# Patient Record
Sex: Female | Born: 1995 | Race: Black or African American | Hispanic: No | Marital: Single | Smoking: Current some day smoker
Health system: Southern US, Community
[De-identification: ages and names within clinical notes are randomized; demographics above are authoritative.]

## PROBLEM LIST (undated history)

## (undated) ENCOUNTER — Inpatient Hospital Stay (HOSPITAL_COMMUNITY): Payer: Self-pay

## (undated) DIAGNOSIS — Z8619 Personal history of other infectious and parasitic diseases: Secondary | ICD-10-CM

## (undated) DIAGNOSIS — J40 Bronchitis, not specified as acute or chronic: Secondary | ICD-10-CM

## (undated) DIAGNOSIS — F410 Panic disorder [episodic paroxysmal anxiety] without agoraphobia: Secondary | ICD-10-CM

## (undated) DIAGNOSIS — D649 Anemia, unspecified: Secondary | ICD-10-CM

## (undated) DIAGNOSIS — D709 Neutropenia, unspecified: Secondary | ICD-10-CM

## (undated) DIAGNOSIS — A549 Gonococcal infection, unspecified: Secondary | ICD-10-CM

## (undated) DIAGNOSIS — F419 Anxiety disorder, unspecified: Secondary | ICD-10-CM

## (undated) DIAGNOSIS — A749 Chlamydial infection, unspecified: Secondary | ICD-10-CM

## (undated) HISTORY — PX: NO PAST SURGERIES: SHX2092

## (undated) HISTORY — DX: Personal history of other infectious and parasitic diseases: Z86.19

---
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
## 2015-01-05 ENCOUNTER — Ambulatory Visit (HOSPITAL_COMMUNITY)
Admission: RE | Admit: 2015-01-05 | Discharge: 2015-01-05 | Disposition: A | Discharge status: Home / Self Care | Payer: Medicaid Other | Source: Ambulatory Visit | Attending: Family Medicine | Admitting: Family Medicine

## 2015-01-05 DIAGNOSIS — Z3689 Encounter for other specified antenatal screening: Secondary | ICD-10-CM | POA: Insufficient documentation

## 2015-01-05 DIAGNOSIS — Z3A19 19 weeks gestation of pregnancy: Secondary | ICD-10-CM | POA: Insufficient documentation

## 2015-01-05 DIAGNOSIS — Z3401 Encounter for supervision of normal first pregnancy, first trimester: Secondary | ICD-10-CM

## 2015-01-25 ENCOUNTER — Ambulatory Visit (INDEPENDENT_AMBULATORY_CARE_PROVIDER_SITE_OTHER): Payer: Medicaid Other | Admitting: Obstetrics & Gynecology

## 2015-01-25 VITALS — BP 107/66 | HR 76 | Wt 147.0 lb

## 2015-01-25 DIAGNOSIS — Z3403 Encounter for supervision of normal first pregnancy, third trimester: Secondary | ICD-10-CM

## 2015-01-25 NOTE — Progress Notes (Signed)
Subjective:  Kelli Soto is a 19 y.o. G1P0 at [redacted]w[redacted]d being seen today for ongoing prenatal care.  Patient reports no complaints.  Contractions: Not present.  Vag. Bleeding: None. Movement: Present. Denies leaking of fluid.   The following portions of the patient's history were reviewed and updated as appropriate: allergies, current medications, past family history, past medical history, past social history, past surgical history and problem list.   Objective:   Filed Vitals:   01/25/15 1048  BP: 107/66  Pulse: 76  Weight: 147 lb (66.679 kg)    Fetal Status: Fetal Heart Rate (bpm): 156   Movement: Present     General:  Alert, oriented and cooperative. Patient is in no acute distress.  Skin: Skin is warm and dry. No rash noted.   Cardiovascular: Normal heart rate noted  Respiratory: Normal respiratory effort, no problems with respiration noted  Abdomen: Soft, gravid, appropriate for gestational age. Pain/Pressure: Absent     Pelvic: Vag. Bleeding: None Vag D/C Character: Thin   Cervical exam deferred        Extremities: Normal range of motion.  Edema: None  Mental Status: Normal mood and affect. Normal behavior. Normal judgment and thought content.   Urinalysis: Urine Protein: Trace Urine Glucose: Negative  Assessment and Plan:  Pregnancy: G1P0 at [redacted]w[redacted]d  1. Supervision of normal first teen pregnancy, third trimester   Preterm labor symptoms and general obstetric precautions including but not limited to vaginal bleeding, contractions, leaking of fluid and fetal movement were reviewed in detail with the patient.  Please refer to After Visit Summary for other counseling recommendations.  Return in about 4 weeks (around 02/22/2015).   Allie Bossier, MD

## 2015-02-22 ENCOUNTER — Ambulatory Visit (INDEPENDENT_AMBULATORY_CARE_PROVIDER_SITE_OTHER): Payer: Medicaid Other | Admitting: Obstetrics & Gynecology

## 2015-02-22 ENCOUNTER — Encounter: Payer: Self-pay | Admitting: Obstetrics & Gynecology

## 2015-02-22 ENCOUNTER — Encounter: Payer: Medicaid Other | Admitting: Obstetrics & Gynecology

## 2015-02-22 VITALS — BP 105/63 | HR 60 | Wt 152.0 lb

## 2015-02-22 DIAGNOSIS — Z3403 Encounter for supervision of normal first pregnancy, third trimester: Secondary | ICD-10-CM

## 2015-02-22 NOTE — Progress Notes (Signed)
Subjective:  Kelli Soto is a 19 y.o. G1P0 at [redacted]w[redacted]d being seen today for ongoing prenatal care.  Patient reports no complaints.  Contractions: Not present.  Vag. Bleeding: None. Movement: Present. Denies leaking of fluid.   The following portions of the patient's history were reviewed and updated as appropriate: allergies, current medications, past family history, past medical history, past social history, past surgical history and problem list.   Objective:   Filed Vitals:   02/22/15 1049  BP: 105/63  Pulse: 60  Weight: 152 lb (68.947 kg)    Fetal Status: Fetal Heart Rate (bpm): 146   Movement: Present     General:  Alert, oriented and cooperative. Patient is in no acute distress.  Skin: Skin is warm and dry. No rash noted.   Cardiovascular: Normal heart rate noted  Respiratory: Normal respiratory effort, no problems with respiration noted  Abdomen: Soft, gravid, appropriate for gestational age. Pain/Pressure: Absent     Pelvic: Vag. Bleeding: None Vag D/C Character: Thin   Cervical exam deferred        Extremities: Normal range of motion.  Edema: None  Mental Status: Normal mood and affect. Normal behavior. Normal judgment and thought content.   Urinalysis: Urine Protein: Trace Urine Glucose: Negative  Assessment and Plan:  Pregnancy: G1P0 at [redacted]w[redacted]d  1. Supervision of normal first teen pregnancy, third trimester - She declines flu vaccine, is aware that ACOG recommends it - glucola, tdap, labs at next  Preterm labor symptoms and general obstetric precautions including but not limited to vaginal bleeding, contractions, leaking of fluid and fetal movement were reviewed in detail with the patient. Please refer to After Visit Summary for other counseling recommendations.  Return in about 3 weeks (around 03/15/2015) for glucola.   Allie Bossier, MD

## 2015-03-15 ENCOUNTER — Ambulatory Visit (INDEPENDENT_AMBULATORY_CARE_PROVIDER_SITE_OTHER): Payer: Medicaid Other | Admitting: Obstetrics & Gynecology

## 2015-03-15 VITALS — BP 115/72 | HR 69 | Wt 154.0 lb

## 2015-03-15 DIAGNOSIS — Z36 Encounter for antenatal screening of mother: Secondary | ICD-10-CM | POA: Diagnosis not present

## 2015-03-15 DIAGNOSIS — Z3403 Encounter for supervision of normal first pregnancy, third trimester: Secondary | ICD-10-CM

## 2015-03-15 LAB — CBC
HCT: 32.3 % — ABNORMAL LOW (ref 36.0–46.0)
Hemoglobin: 11 g/dL — ABNORMAL LOW (ref 12.0–15.0)
MCH: 29.3 pg (ref 26.0–34.0)
MCHC: 34.1 g/dL (ref 30.0–36.0)
MCV: 85.9 fL (ref 78.0–100.0)
MPV: 10.4 fL (ref 8.6–12.4)
PLATELETS: 173 10*3/uL (ref 150–400)
RBC: 3.76 MIL/uL — ABNORMAL LOW (ref 3.87–5.11)
RDW: 12.9 % (ref 11.5–15.5)
WBC: 4.3 10*3/uL (ref 4.0–10.5)

## 2015-03-15 NOTE — Progress Notes (Signed)
Subjective:  Kelli Soto is a 19 y.o. SAA G1P0 at [redacted]w[redacted]d being seen today for ongoing prenatal care.  Patient reports no complaints.  Contractions: Not present.  Vag. Bleeding: None. Movement: Present. Denies leaking of fluid.   The following portions of the patient's history were reviewed and updated as appropriate: allergies, current medications, past family history, past medical history, past social history, past surgical history and problem list.   Objective:   Filed Vitals:   03/15/15 1358  BP: 115/72  Pulse: 69  Weight: 154 lb (69.854 kg)    Fetal Status: Fetal Heart Rate (bpm): 138   Movement: Present     General:  Alert, oriented and cooperative. Patient is in no acute distress.  Skin: Skin is warm and dry. No rash noted.   Cardiovascular: Normal heart rate noted  Respiratory: Normal respiratory effort, no problems with respiration noted  Abdomen: Soft, gravid, appropriate for gestational age. Pain/Pressure: Absent     Pelvic: Vag. Bleeding: None Vag D/C Character: Thin   Cervical exam deferred        Extremities: Normal range of motion.  Edema: None  Mental Status: Normal mood and affect. Normal behavior. Normal judgment and thought content.   Urinalysis: Urine Protein: Negative Urine Glucose: Negative  Assessment and Plan:  Pregnancy: G1P0 at [redacted]w[redacted]d  1. Supervision of normal first teen pregnancy, third trimester  - CBC - HIV antibody - RPR - Glucose Tolerance, 1 HR (50g) - As of today, she declines TDAP and Flu vaccines Preterm labor symptoms and general obstetric precautions including but not limited to vaginal bleeding, contractions, leaking of fluid and fetal movement were reviewed in detail with the patient. Please refer to After Visit Summary for other counseling recommendations.  Return in about 3 weeks (around 04/05/2015).   Allie Bossier, MD

## 2015-03-16 LAB — HIV ANTIBODY (ROUTINE TESTING W REFLEX): HIV 1&2 Ab, 4th Generation: NONREACTIVE

## 2015-03-16 LAB — GLUCOSE TOLERANCE, 1 HOUR (50G) W/O FASTING: Glucose, 1 Hour GTT: 82 mg/dL (ref 70–140)

## 2015-03-16 LAB — RPR

## 2015-03-17 ENCOUNTER — Encounter: Payer: Medicaid Other | Admitting: Family Medicine

## 2015-04-05 ENCOUNTER — Telehealth: Payer: Self-pay | Admitting: *Deleted

## 2015-04-05 ENCOUNTER — Ambulatory Visit (INDEPENDENT_AMBULATORY_CARE_PROVIDER_SITE_OTHER): Payer: Medicaid Other | Admitting: Obstetrics & Gynecology

## 2015-04-05 VITALS — BP 110/67 | HR 60 | Wt 157.0 lb

## 2015-04-05 DIAGNOSIS — Z3403 Encounter for supervision of normal first pregnancy, third trimester: Secondary | ICD-10-CM

## 2015-04-05 NOTE — Telephone Encounter (Signed)
Spoke to pt, informed her that we would only be able to know estimated fetal weight by US at this gestation age but when she gets to be around 36-37 wks the physician may be able to give her an estimated size based on measurement and feeling of the baby at that time.  Pt acknowledged understanding, no other questions at this time.

## 2015-04-05 NOTE — Telephone Encounter (Signed)
-----   Message from Olevia BowensJacinda S Battle sent at 04/05/2015 11:56 AM EDT ----- Regarding: Question about Baby Question from today's visit, wants to know the baby's est. birth weight

## 2015-04-26 ENCOUNTER — Other Ambulatory Visit (HOSPITAL_COMMUNITY)
Admission: RE | Admit: 2015-04-26 | Discharge: 2015-04-26 | Disposition: A | Discharge status: Home / Self Care | Payer: Medicaid Other | Source: Ambulatory Visit | Attending: Obstetrics & Gynecology | Admitting: Obstetrics & Gynecology

## 2015-04-26 ENCOUNTER — Ambulatory Visit (INDEPENDENT_AMBULATORY_CARE_PROVIDER_SITE_OTHER): Payer: Medicaid Other | Admitting: Obstetrics & Gynecology

## 2015-04-26 ENCOUNTER — Telehealth: Payer: Self-pay | Admitting: *Deleted

## 2015-04-26 VITALS — BP 112/68 | HR 87 | Wt 159.0 lb

## 2015-04-26 DIAGNOSIS — Z113 Encounter for screening for infections with a predominantly sexual mode of transmission: Secondary | ICD-10-CM | POA: Diagnosis present

## 2015-04-26 DIAGNOSIS — Z3403 Encounter for supervision of normal first pregnancy, third trimester: Secondary | ICD-10-CM

## 2015-04-26 NOTE — Progress Notes (Signed)
Subjective:  Kelli Soto is a 19 y.o. S AA  G1P0 at 3211w4d being seen today for ongoing prenatal care.  Patient reports no complaints.  Contractions: Irregular.  Vag. Bleeding: None. Movement: Present. Denies leaking of fluid.   The following portions of the patient's history were reviewed and updated as appropriate: allergies, current medications, past family history, past medical history, past social history, past surgical history and problem list. Problem list updated.  Objective:   Filed Vitals:   04/26/15 1135  BP: 112/68  Pulse: 87  Weight: 159 lb (72.122 kg)    Fetal Status: Fetal Heart Rate (bpm): 143 Fundal Height: 32 cm Movement: Present     General:  Alert, oriented and cooperative. Patient is in no acute distress.  Skin: Skin is warm and dry. No rash noted.   Cardiovascular: Normal heart rate noted  Respiratory: Normal respiratory effort, no problems with respiration noted  Abdomen: Soft, gravid, appropriate for gestational age. Pain/Pressure: Absent     Pelvic: Vag. Bleeding: None     Cervical exam deferred        Extremities: Normal range of motion.  Edema: Trace  Mental Status: Normal mood and affect. Normal behavior. Normal judgment and thought content.   Urinalysis: Urine Protein: Negative Urine Glucose: Negative  Assessment and Plan:  Pregnancy: G1P0 at 7711w4d  1. Supervision of normal first teen pregnancy, third trimester  - [facility]lam[facility], +GBS in urine - US MFM OB FOLLOW UP; Future  Preterm labor symptoms and general obstetric precautions including but not limited to vaginal bleeding, contractions, leaking of fluid and fetal movement were reviewed in detail with the patient. Please refer to After Visit Summary for other counseling recommendations.  Return in about 1 week (around 05/03/2015).   Allie BossierMyra C Maniah Nading, MD

## 2015-04-26 NOTE — Telephone Encounter (Signed)
-----   Message from Olevia BowensJacinda S Battle sent at 04/26/2015  1:45 PM EST ----- Regarding: Lab Result Contact: [phone] Had a question about a urine result ?GBS? maybe

## 2015-04-26 NOTE — Telephone Encounter (Signed)
Spoke to pt about GBS + urine result and informed her about GBS and treatment during labor.  Pt had no further questions at this time.

## 2015-04-27 LAB — URINE CYTOLOGY ANCILLARY ONLY
CHLAMYDIA, DNA PROBE: NEGATIVE
Neisseria Gonorrhea: NEGATIVE

## 2015-05-02 ENCOUNTER — Other Ambulatory Visit: Payer: Self-pay | Admitting: Obstetrics & Gynecology

## 2015-05-02 ENCOUNTER — Ambulatory Visit (HOSPITAL_COMMUNITY)
Admission: RE | Admit: 2015-05-02 | Discharge: 2015-05-02 | Disposition: A | Discharge status: Home / Self Care | Payer: Medicaid Other | Source: Ambulatory Visit | Attending: Obstetrics & Gynecology | Admitting: Obstetrics & Gynecology

## 2015-05-02 DIAGNOSIS — Z36 Encounter for antenatal screening of mother: Secondary | ICD-10-CM | POA: Insufficient documentation

## 2015-05-02 DIAGNOSIS — O365930 Maternal care for other known or suspected poor fetal growth, third trimester, not applicable or unspecified: Secondary | ICD-10-CM

## 2015-05-02 DIAGNOSIS — Z3A36 36 weeks gestation of pregnancy: Secondary | ICD-10-CM | POA: Insufficient documentation

## 2015-05-02 DIAGNOSIS — Z3403 Encounter for supervision of normal first pregnancy, third trimester: Secondary | ICD-10-CM

## 2015-05-04 ENCOUNTER — Ambulatory Visit (INDEPENDENT_AMBULATORY_CARE_PROVIDER_SITE_OTHER): Payer: Medicaid Other | Admitting: Physician Assistant

## 2015-05-04 ENCOUNTER — Encounter: Payer: Self-pay | Admitting: Physician Assistant

## 2015-05-04 ENCOUNTER — Inpatient Hospital Stay (HOSPITAL_COMMUNITY)
Admission: AD | Admit: 2015-05-04 | Discharge: 2015-05-04 | Disposition: A | Discharge status: Home / Self Care | Payer: Medicaid Other | Source: Ambulatory Visit | Attending: Obstetrics & Gynecology | Admitting: Obstetrics & Gynecology

## 2015-05-04 ENCOUNTER — Inpatient Hospital Stay (HOSPITAL_COMMUNITY): Payer: Medicaid Other

## 2015-05-04 ENCOUNTER — Encounter (HOSPITAL_COMMUNITY): Payer: Self-pay | Admitting: *Deleted

## 2015-05-04 VITALS — BP 121/69 | HR 80 | Wt 162.0 lb

## 2015-05-04 DIAGNOSIS — O26843 Uterine size-date discrepancy, third trimester: Secondary | ICD-10-CM

## 2015-05-04 DIAGNOSIS — Z3A36 36 weeks gestation of pregnancy: Secondary | ICD-10-CM | POA: Diagnosis not present

## 2015-05-04 DIAGNOSIS — O289 Unspecified abnormal findings on antenatal screening of mother: Secondary | ICD-10-CM

## 2015-05-04 DIAGNOSIS — O288 Other abnormal findings on antenatal screening of mother: Secondary | ICD-10-CM

## 2015-05-04 DIAGNOSIS — Z3403 Encounter for supervision of normal first pregnancy, third trimester: Secondary | ICD-10-CM

## 2015-05-04 HISTORY — DX: Bronchitis, not specified as acute or chronic: J40

## 2015-05-04 NOTE — Patient Instructions (Signed)
Pain Relief During Labor and Delivery Everyone experiences pain differently, but labor causes severe pain for many women. The amount of pain you experience during labor and delivery depends on your pain tolerance, contraction strength, and your baby's size and position. There are many ways to prepare for and deal with the pain, including:   Taking prenatal classes to learn about labor and delivery. The more informed you are, the less anxious and afraid you may be. This can help lessen the pain.  Taking pain-relieving medicine during labor and delivery.  Learning breathing and relaxation techniques.  Taking a shower or bath.  Getting massaged.  Changing positions.  Placing an ice pack on your back. Discuss your pain control options with your health care provider during your prenatal visits.  WHAT ARE THE TWO TYPES OF PAIN-RELIEVING MEDICINES? 1. Analgesics. These are medicines that decrease pain without total loss of feeling or muscle movement. 2. Anesthetics. These are medicines that block all feeling, including pain. There can be minor side effects of both types, such as nausea, trouble concentrating, becoming sleepy, and lowering the heart rate of the baby. However, health care providers are careful to give doses that will not seriously affect the baby.  WHAT ARE THE SPECIFIC TYPES OF ANALGESICS AND ANESTHETICS? Systemic Analgesic Systemic pain medicines affect your whole body rather than focusing pain relief on the area of your body experiencing pain. This type of medicine is given either through an IV tube in your vein or by a shot (injection) into your muscle. This medicine will lessen your pain but will not stop it completely. It may also make you sleepy, but it will not make you lose consciousness.  Local Anesthetic Local anesthetic isused tonumb a small area of your body. The medicine is injected into the area of nerves that carry feeling to the vagina, vulva, or the area between  the vagina and anus (perineum).  General Anesthetic This type of medicine causes you to lose consciousness so you do not feel pain. It is usually used only in emergency situations during labor. It is given through an IV tube or face mask. Paracervical Block A paracervical block is a form of local anesthesia given during labor. Numbing medicine is injected into the right and left sides of the cervix and vagina. It helps to lessen the pain caused by contractions and stretching of the cervix. It may have to be given more than once.  Pudendal Block A pudendal block is another form of local anesthesia. It is used to relieve the pain associated with pushing or stretching of the perineum at the time of delivery. An injection is given deep through the vaginal wall into the pudendal nerve in the pelvis, numbing the perineum.  Epidural Anesthetic An epidural is an injection of numbing medicine given in the lower back and into the epidural space near your spinal cord. The epidural numbs the lower half of your body. You may be able to move your legs but will not be allowed to walk. Epidurals can be used for labor, delivery, or cesarean deliveries.  To prevent the medicine from wearing off, a small tube (catheter) may be threaded into the epidural space and taped in place to prevent it from slipping out. Medicine can then be given continuously in small doses through the tube until you deliver. Spinal Block A spinal block is similar to an epidural, but the medicine is injected into the spinal fluid, not the epidural space. A spinal block is only given   once. It starts to relieve pain quickly but lasts only 1-2 hours. Spinal blocks can also be used for cesarean deliveries.  Combined Spinal-Epidural Block Combined spinal-epidural blocks combine the benefits of both the spinal and epidural blocks. The spinal part acts quickly to relieve pain and the epidural provides continuous pain relief. Hydrotherapy Immersion in  warm water during labor may provide comfort and relaxation. It may also help to lessen pain, the use of anesthesia, and the length of labor. However, immersion in water during the delivery (water birth) may have some risk involved and studies to determine safety and risks are ongoing. If you are a healthy woman who is expecting an uncomplicated birth, talk with your health care provider to see if water birth is an option for you.    This information is not intended to replace advice given to you by your health care provider. Make sure you discuss any questions you have with your health care provider.   Document Released: 09/20/2008 Document Revised: 06/09/2013 Document Reviewed: 10/23/2012 Elsevier Interactive Patient Education 2016 [employer] Inc[employer]  

## 2015-05-04 NOTE — Discharge Instructions (Signed)
Fetal Movement Counts  Patient Name: __________________________________________________ Patient Due Date: ____________________  Performing a fetal movement count is highly recommended in high-risk pregnancies, but it is good for every pregnant woman to do. Your health care provider may ask you to start counting fetal movements at 28 weeks of the pregnancy. Fetal movements often increase:  · After eating a full meal.  · After physical activity.  · After eating or drinking something sweet or cold.  · At rest.  Pay attention to when you feel the baby is most active. This will help you notice a pattern of your baby's sleep and wake cycles and what factors contribute to an increase in fetal movement. It is important to perform a fetal movement count at the same time each day when your baby is normally most active.   HOW TO COUNT FETAL MOVEMENTS  1. Find a quiet and comfortable area to sit or lie down on your left side. Lying on your left side provides the best blood and oxygen circulation to your baby.  2. Write down the day and time on a sheet of paper or in a journal.  3. Start counting kicks, flutters, swishes, rolls, or jabs in a 2-hour period. You should feel at least 10 movements within 2 hours.  4. If you do not feel 10 movements in 2 hours, wait 2-3 hours and count again. Look for a change in the pattern or not enough counts in 2 hours.  SEEK MEDICAL CARE IF:  · You feel less than 10 counts in 2 hours, tried twice.  · There is no movement in over an hour.  · The pattern is changing or taking longer each day to reach 10 counts in 2 hours.  · You feel the baby is not moving as he or she usually does.  Date: ____________ Movements: ____________ Start time: ____________ Finish time: ____________   Date: ____________ Movements: ____________ Start time: ____________ Finish time: ____________  Date: ____________ Movements: ____________ Start time: ____________ Finish time: ____________  Date: ____________ Movements:  ____________ Start time: ____________ Finish time: ____________  Date: ____________ Movements: ____________ Start time: ____________ Finish time: ____________  Date: ____________ Movements: ____________ Start time: ____________ Finish time: ____________  Date: ____________ Movements: ____________ Start time: ____________ Finish time: ____________  Date: ____________ Movements: ____________ Start time: ____________ Finish time: ____________   Date: ____________ Movements: ____________ Start time: ____________ Finish time: ____________  Date: ____________ Movements: ____________ Start time: ____________ Finish time: ____________  Date: ____________ Movements: ____________ Start time: ____________ Finish time: ____________  Date: ____________ Movements: ____________ Start time: ____________ Finish time: ____________  Date: ____________ Movements: ____________ Start time: ____________ Finish time: ____________  Date: ____________ Movements: ____________ Start time: ____________ Finish time: ____________  Date: ____________ Movements: ____________ Start time: ____________ Finish time: ____________   Date: ____________ Movements: ____________ Start time: ____________ Finish time: ____________  Date: ____________ Movements: ____________ Start time: ____________ Finish time: ____________  Date: ____________ Movements: ____________ Start time: ____________ Finish time: ____________  Date: ____________ Movements: ____________ Start time: ____________ Finish time: ____________  Date: ____________ Movements: ____________ Start time: ____________ Finish time: ____________  Date: ____________ Movements: ____________ Start time: ____________ Finish time: ____________  Date: ____________ Movements: ____________ Start time: ____________ Finish time: ____________   Date: ____________ Movements: ____________ Start time: ____________ Finish time: ____________  Date: ____________ Movements: ____________ Start time: ____________ Finish  time: ____________  Date: ____________ Movements: ____________ Start time: ____________ Finish time: ____________  Date: ____________ Movements: ____________ Start time:   ____________ Finish time: ____________  Date: ____________ Movements: ____________ Start time: ____________ Finish time: ____________  Date: ____________ Movements: ____________ Start time: ____________ Finish time: ____________  Date: ____________ Movements: ____________ Start time: ____________ Finish time: ____________   Date: ____________ Movements: ____________ Start time: ____________ Finish time: ____________  Date: ____________ Movements: ____________ Start time: ____________ Finish time: ____________  Date: ____________ Movements: ____________ Start time: ____________ Finish time: ____________  Date: ____________ Movements: ____________ Start time: ____________ Finish time: ____________  Date: ____________ Movements: ____________ Start time: ____________ Finish time: ____________  Date: ____________ Movements: ____________ Start time: ____________ Finish time: ____________  Date: ____________ Movements: ____________ Start time: ____________ Finish time: ____________   Date: ____________ Movements: ____________ Start time: ____________ Finish time: ____________  Date: ____________ Movements: ____________ Start time: ____________ Finish time: ____________  Date: ____________ Movements: ____________ Start time: ____________ Finish time: ____________  Date: ____________ Movements: ____________ Start time: ____________ Finish time: ____________  Date: ____________ Movements: ____________ Start time: ____________ Finish time: ____________  Date: ____________ Movements: ____________ Start time: ____________ Finish time: ____________  Date: ____________ Movements: ____________ Start time: ____________ Finish time: ____________   Date: ____________ Movements: ____________ Start time: ____________ Finish time: ____________  Date: ____________  Movements: ____________ Start time: ____________ Finish time: ____________  Date: ____________ Movements: ____________ Start time: ____________ Finish time: ____________  Date: ____________ Movements: ____________ Start time: ____________ Finish time: ____________  Date: ____________ Movements: ____________ Start time: ____________ Finish time: ____________  Date: ____________ Movements: ____________ Start time: ____________ Finish time: ____________  Date: ____________ Movements: ____________ Start time: ____________ Finish time: ____________   Date: ____________ Movements: ____________ Start time: ____________ Finish time: ____________  Date: ____________ Movements: ____________ Start time: ____________ Finish time: ____________  Date: ____________ Movements: ____________ Start time: ____________ Finish time: ____________  Date: ____________ Movements: ____________ Start time: ____________ Finish time: ____________  Date: ____________ Movements: ____________ Start time: ____________ Finish time: ____________  Date: ____________ Movements: ____________ Start time: ____________ Finish time: ____________     This information is not intended to replace advice given to you by your health care provider. Make sure you discuss any questions you have with your health care provider.     Document Released: 07/04/2006 Document Revised: 06/25/2014 Document Reviewed: 03/31/2012  Elsevier Interactive Patient Education ©2016 [employer] Inc[employer]

## 2015-05-04 NOTE — MAU Note (Signed)
[place] [facility] [facility] [place] fetal heart decels in office.

## 2015-05-04 NOTE — MAU Provider Note (Signed)
  History     CSN: [id number]  Arrival date and time: 05/04/15 1532   First Provider Initiated Contact with Patient 05/04/15 1615      Chief Complaint  Patient presents with  . Fetal decels in office    HPI  Kelli Soto 19 y.o. G1P0 @[redacted]w[redacted]d  presents to MAU from the [place] office because she had a nonreactive strip there. Denies vaginal bleeding, contractions. Reports positive fetal movement.   Past Medical History  Diagnosis Date  . Bronchitis     Past Surgical History  Procedure Laterality Date  . No past surgeries      Family History  Problem Relation Age of Onset  . Hypertension Mother   . Diabetes Other     Social History  Substance Use Topics  . Smoking status: Never Smoker   . Smokeless tobacco: None  . Alcohol Use: No    Allergies: No Known Allergies  Prescriptions prior to admission  Medication Sig Dispense Refill Last Dose  . Prenat w/o A Vit-FeFum-FePo-FA (CONCEPT OB) 130-92.4-1 MG CAPS Take 1 capsule by mouth daily. (Patient not taking: Reported on 05/04/2015) 30 capsule 11 Not Taking    Review of Systems  Constitutional: Positive for fever.  Genitourinary:       Non reactive fetal monitor strip   Physical Exam   Blood pressure 122/65, pulse 59, temperature 98.4 F (36.9 C), temperature source Oral, resp. rate 16, last menstrual period 08/23/2014.  Physical Exam  Nursing note and vitals reviewed. Constitutional: She is oriented to person, place, and time. She appears well-developed and well-nourished. No distress.  HENT:  Head: Normocephalic and atraumatic.  Cardiovascular: Normal rate.   Respiratory: Effort normal and breath sounds normal. No respiratory distress.  GI: Soft. There is no tenderness. There is no guarding.  Neurological: She is alert and oriented to person, place, and time.  Skin: Skin is warm and dry.  Psychiatric: She has a normal mood and affect. Her behavior is normal. Judgment and thought content normal.     MAU Course  Procedures  MDM FHR-Cat 1 Irregular contractions. Sent for Bpp- 8-8  Assessment and Plan  Non reassurring fht's Keep next regular scheduled appointment Discharge to home  [facility]oto 05/04/2015, 5:31 PM

## 2015-05-04 NOTE — Progress Notes (Signed)
Subjective:  Kelli Soto is a 19 y.o. G1P0 at 292w2d being seen today for ongoing prenatal care.  Patient reports occasional contractions.  Contractions: Not present.  Vag. Bleeding: None. Movement: Present. Denies leaking of fluid.   The following portions of the patient's history were reviewed and updated as appropriate: allergies, current medications, past family history, past medical history, past social history, past surgical history and problem list.   Objective:   Filed Vitals:   05/04/15 1325  BP: 121/69  Pulse: 80  Weight: 162 lb (73.483 kg)    Fetal Status: Fetal Heart Rate (bpm): 155 Fundal Height: 31 cm Movement: Present     General:  Alert, oriented and cooperative. Patient is in no acute distress.  Skin: Skin is warm and dry. No rash noted.   Cardiovascular: Normal heart rate noted  Respiratory: Normal respiratory effort, no problems with respiration noted  Abdomen: Soft, gravid, appropriate for gestational age. Pain/Pressure: Absent     Pelvic: Vag. Bleeding: None Vag D/C Character: Thin   Cervical exam deferred        Extremities: Normal range of motion.  Edema: None  Mental Status: Normal mood and affect. Normal behavior. Normal judgment and thought content.   Urinalysis: Urine Protein: Negative Urine Glucose: Negative  Assessment and Plan:  Pregnancy: G1P0 at 622w2d  1. Supervision of normal first teen pregnancy, third trimester Daily PNV.  Pt has.  Cultures done at last visit.  GBS positive in urine previously  2.  Small for gestational age - is followed by MFM  - U/S on 11/14 with EFW at 14% and AC <3%- will have NST today and then twice weekly NSTs, weekly AFI and UA doppler  Term labor symptoms and general obstetric precautions including but not limited to vaginal bleeding, contractions, leaking of fluid and fetal movement were reviewed in detail with the patient. Please refer to After Visit Summary for other counseling recommendations.   NST was  nonreactive without appropriate accels and with variables verses decelerations.  MAU provider notified.  Pt to go directly to MAU now for further eval and consideration of delivery.    Bertram DenverKaren E Teague Clark, PA-C

## 2015-05-09 ENCOUNTER — Other Ambulatory Visit (HOSPITAL_COMMUNITY): Payer: Self-pay | Admitting: Maternal and Fetal Medicine

## 2015-05-09 ENCOUNTER — Ambulatory Visit (HOSPITAL_COMMUNITY)
Admission: RE | Admit: 2015-05-09 | Discharge: 2015-05-09 | Disposition: A | Discharge status: Home / Self Care | Payer: Medicaid Other | Source: Ambulatory Visit | Attending: Obstetrics & Gynecology | Admitting: Obstetrics & Gynecology

## 2015-05-09 ENCOUNTER — Encounter (HOSPITAL_COMMUNITY): Payer: Self-pay

## 2015-05-09 DIAGNOSIS — Z3A37 37 weeks gestation of pregnancy: Secondary | ICD-10-CM | POA: Insufficient documentation

## 2015-05-09 DIAGNOSIS — O365930 Maternal care for other known or suspected poor fetal growth, third trimester, not applicable or unspecified: Secondary | ICD-10-CM

## 2015-05-10 ENCOUNTER — Ambulatory Visit (INDEPENDENT_AMBULATORY_CARE_PROVIDER_SITE_OTHER): Payer: Medicaid Other | Admitting: Obstetrics & Gynecology

## 2015-05-10 DIAGNOSIS — O365930 Maternal care for other known or suspected poor fetal growth, third trimester, not applicable or unspecified: Secondary | ICD-10-CM | POA: Diagnosis not present

## 2015-05-10 DIAGNOSIS — O288 Other abnormal findings on antenatal screening of mother: Secondary | ICD-10-CM

## 2015-05-10 DIAGNOSIS — O289 Unspecified abnormal findings on antenatal screening of mother: Secondary | ICD-10-CM

## 2015-05-10 DIAGNOSIS — Z3403 Encounter for supervision of normal first pregnancy, third trimester: Secondary | ICD-10-CM

## 2015-05-10 NOTE — Progress Notes (Signed)
Subjective:  Kelli Soto is a 19 y.o. S AA G1P0 at 9386w1d being seen today for ongoing prenatal care.  She is currently monitored for the following issues for this high-risk pregnancy: Patient Active Problem List   Diagnosis Date Noted  . Small for gestational age (SGA) 05/04/2015  . Group B streptococcus bacteria in urine complicating pregnancy 10/30/2014  . Rubella non-immune status, antepartum 10/28/2014  . Supervision of normal first teen pregnancy 10/27/2014   Patient reports no complaints.  Contractions: Irregular. Vag. Bleeding: None.  Movement: Present. Denies leaking of fluid.   The following portions of the patient's history were reviewed and updated as appropriate: allergies, current medications, past family history, past medical history, past social history, past surgical history and problem list. Problem list updated.  Objective:   Filed Vitals:   05/10/15 1318  BP: 112/69  Pulse: 80  Weight: 165 lb (74.844 kg)    Fetal Status: Fetal Heart Rate (bpm): 150   Movement: Present     General:  Alert, oriented and cooperative. Patient is in no acute distress.  Skin: Skin is warm and dry. No rash noted.   Cardiovascular: Normal heart rate noted  Respiratory: Normal respiratory effort, no problems with respiration noted  Abdomen: Soft, gravid, appropriate for gestational age. Pain/Pressure: Absent     Pelvic: Vag. Bleeding: None Vag D/C Character: Thin   Cervical exam deferred        Extremities: Normal range of motion.  Edema: None  Mental Status: Normal mood and affect. Normal behavior. Normal judgment and thought content.   Urinalysis: Urine Protein: Negative Urine Glucose: Negative  Assessment and Plan:  Pregnancy: G1P0 at 5786w1d  1. Supervision of normal first teen pregnancy, third trimester   2. Small for gestational age (SGA)  - Fetal nonstress test; Future - US MFM FETAL BPP WO NON STRESS; Future  3. Non-reactive NST (non-stress test) - [facility] adament  [facility] but she thinks that she can get a ride tomorrow. I will order the BPP for tomorrow. - US MFM FETAL BPP WO NON STRESS; Future  Preterm labor symptoms and general obstetric precautions including but not limited to vaginal bleeding, contractions, leaking of fluid and fetal movement were reviewed in detail with the patient. Please refer to After Visit Summary for other counseling recommendations.  Return for twice weekly testing. [facility]   Allie BossierMyra C Charlei Ramsaran, MD

## 2015-05-11 ENCOUNTER — Ambulatory Visit (HOSPITAL_COMMUNITY)
Admission: RE | Admit: 2015-05-11 | Discharge: 2015-05-11 | Disposition: A | Discharge status: Home / Self Care | Payer: Medicaid Other | Source: Ambulatory Visit | Attending: Obstetrics & Gynecology | Admitting: Obstetrics & Gynecology

## 2015-05-11 ENCOUNTER — Encounter: Payer: Medicaid Other | Admitting: Advanced Practice Midwife

## 2015-05-11 DIAGNOSIS — O365930 Maternal care for other known or suspected poor fetal growth, third trimester, not applicable or unspecified: Secondary | ICD-10-CM | POA: Diagnosis present

## 2015-05-11 DIAGNOSIS — O283 Abnormal ultrasonic finding on antenatal screening of mother: Secondary | ICD-10-CM | POA: Diagnosis not present

## 2015-05-11 DIAGNOSIS — Z3A37 37 weeks gestation of pregnancy: Secondary | ICD-10-CM | POA: Insufficient documentation

## 2015-05-11 DIAGNOSIS — O288 Other abnormal findings on antenatal screening of mother: Secondary | ICD-10-CM

## 2015-05-12 ENCOUNTER — Inpatient Hospital Stay (HOSPITAL_COMMUNITY)
Admission: AD | Admit: 2015-05-12 | Discharge: 2015-05-12 | Disposition: A | Discharge status: Home / Self Care | Payer: Medicaid Other | Source: Ambulatory Visit | Attending: Obstetrics and Gynecology | Admitting: Obstetrics and Gynecology

## 2015-05-12 ENCOUNTER — Encounter (HOSPITAL_COMMUNITY): Payer: Self-pay | Admitting: *Deleted

## 2015-05-12 DIAGNOSIS — B951 Streptococcus, group B, as the cause of diseases classified elsewhere: Secondary | ICD-10-CM

## 2015-05-12 DIAGNOSIS — Z283 Underimmunization status: Secondary | ICD-10-CM

## 2015-05-12 DIAGNOSIS — O9989 Other specified diseases and conditions complicating pregnancy, childbirth and the puerperium: Secondary | ICD-10-CM

## 2015-05-12 DIAGNOSIS — Z3403 Encounter for supervision of normal first pregnancy, third trimester: Secondary | ICD-10-CM

## 2015-05-12 DIAGNOSIS — O2343 Unspecified infection of urinary tract in pregnancy, third trimester: Principal | ICD-10-CM

## 2015-05-12 DIAGNOSIS — O09899 Supervision of other high risk pregnancies, unspecified trimester: Secondary | ICD-10-CM

## 2015-05-12 NOTE — MAU Note (Signed)
Contractions since 0200. Light spotting.

## 2015-05-12 NOTE — MAU Note (Signed)
Contracting, some light bleeding.

## 2015-05-12 NOTE — Discharge Instructions (Signed)
Keep your scheduled appointment for prenatal care. Drink 8-10 glasses of water per day. °

## 2015-05-13 ENCOUNTER — Inpatient Hospital Stay (HOSPITAL_COMMUNITY): Payer: Medicaid Other | Admitting: Anesthesiology

## 2015-05-13 ENCOUNTER — Inpatient Hospital Stay (HOSPITAL_COMMUNITY)
Admission: AD | Admit: 2015-05-13 | Discharge: 2015-05-15 | DRG: 775 | Disposition: A | Discharge status: Home / Self Care | Payer: Medicaid Other | Source: Ambulatory Visit | Attending: Family Medicine | Admitting: Family Medicine

## 2015-05-13 ENCOUNTER — Encounter (HOSPITAL_COMMUNITY): Payer: Self-pay

## 2015-05-13 DIAGNOSIS — B951 Streptococcus, group B, as the cause of diseases classified elsewhere: Secondary | ICD-10-CM | POA: Diagnosis present

## 2015-05-13 DIAGNOSIS — O09899 Supervision of other high risk pregnancies, unspecified trimester: Secondary | ICD-10-CM

## 2015-05-13 DIAGNOSIS — O365930 Maternal care for other known or suspected poor fetal growth, third trimester, not applicable or unspecified: Secondary | ICD-10-CM | POA: Diagnosis present

## 2015-05-13 DIAGNOSIS — Z3A37 37 weeks gestation of pregnancy: Secondary | ICD-10-CM

## 2015-05-13 DIAGNOSIS — Z8249 Family history of ischemic heart disease and other diseases of the circulatory system: Secondary | ICD-10-CM | POA: Diagnosis not present

## 2015-05-13 DIAGNOSIS — Z2839 Other underimmunization status: Secondary | ICD-10-CM

## 2015-05-13 DIAGNOSIS — O2340 Unspecified infection of urinary tract in pregnancy, unspecified trimester: Secondary | ICD-10-CM

## 2015-05-13 DIAGNOSIS — Z833 Family history of diabetes mellitus: Secondary | ICD-10-CM | POA: Diagnosis not present

## 2015-05-13 DIAGNOSIS — Z283 Underimmunization status: Secondary | ICD-10-CM

## 2015-05-13 DIAGNOSIS — O9989 Other specified diseases and conditions complicating pregnancy, childbirth and the puerperium: Secondary | ICD-10-CM

## 2015-05-13 DIAGNOSIS — IMO0001 Reserved for inherently not codable concepts without codable children: Secondary | ICD-10-CM

## 2015-05-13 DIAGNOSIS — Z3400 Encounter for supervision of normal first pregnancy, unspecified trimester: Secondary | ICD-10-CM

## 2015-05-13 DIAGNOSIS — O99824 Streptococcus B carrier state complicating childbirth: Secondary | ICD-10-CM | POA: Diagnosis not present

## 2015-05-13 DIAGNOSIS — O26893 Other specified pregnancy related conditions, third trimester: Secondary | ICD-10-CM | POA: Diagnosis present

## 2015-05-13 LAB — URINALYSIS, ROUTINE W REFLEX MICROSCOPIC
BILIRUBIN URINE: NEGATIVE
GLUCOSE, UA: NEGATIVE mg/dL
Hgb urine dipstick: NEGATIVE
KETONES UR: NEGATIVE mg/dL
Leukocytes, UA: NEGATIVE
Nitrite: NEGATIVE
PH: 6.5 (ref 5.0–8.0)
Protein, ur: NEGATIVE mg/dL
SPECIFIC GRAVITY, URINE: 1.01 (ref 1.005–1.030)

## 2015-05-13 LAB — CBC
HEMATOCRIT: 35.1 % — AB (ref 36.0–46.0)
HEMOGLOBIN: 11.7 g/dL — AB (ref 12.0–15.0)
MCH: 28.5 pg (ref 26.0–34.0)
MCHC: 33.3 g/dL (ref 30.0–36.0)
MCV: 85.6 fL (ref 78.0–100.0)
Platelets: 202 10*3/uL (ref 150–400)
RBC: 4.1 MIL/uL (ref 3.87–5.11)
RDW: 12.9 % (ref 11.5–15.5)
WBC: 6.1 10*3/uL (ref 4.0–10.5)

## 2015-05-13 LAB — TYPE AND SCREEN
ABO/RH(D): O POS
ANTIBODY SCREEN: NEGATIVE

## 2015-05-13 MED ORDER — PRENATAL MULTIVITAMIN CH
1.0000 | ORAL_TABLET | Freq: Every day | ORAL | Status: DC
  Administered 2015-05-14 – 2015-05-15 (×2): 1 via ORAL
  Filled 2015-05-13 (×2): qty 1

## 2015-05-13 MED ORDER — WITCH HAZEL-GLYCERIN EX PADS: 1.0000 "application " | MEDICATED_PAD | CUTANEOUS | Status: DC | PRN

## 2015-05-13 MED ORDER — OXYCODONE-ACETAMINOPHEN 5-325 MG PO TABS
1.0000 | ORAL_TABLET | ORAL | Status: DC | PRN
  Administered 2015-05-14: 1 via ORAL
  Filled 2015-05-13: qty 1

## 2015-05-13 MED ORDER — SODIUM CHLORIDE 0.9 % IV SOLN
2.0000 g | Freq: Four times a day (QID) | INTRAVENOUS | Status: DC
  Administered 2015-05-13: 2 g via INTRAVENOUS
  Filled 2015-05-13 (×4): qty 2000

## 2015-05-13 MED ORDER — DIPHENHYDRAMINE HCL 25 MG PO CAPS: 25.0000 mg | ORAL_CAPSULE | Freq: Four times a day (QID) | ORAL | Status: DC | PRN

## 2015-05-13 MED ORDER — OXYTOCIN 40 UNITS IN LACTATED RINGERS INFUSION - SIMPLE MED: 62.5000 mL/h | INTRAVENOUS | Status: DC | PRN

## 2015-05-13 MED ORDER — ONDANSETRON HCL 4 MG PO TABS: 4.0000 mg | ORAL_TABLET | ORAL | Status: DC | PRN

## 2015-05-13 MED ORDER — LIDOCAINE HCL (PF) 1 % IJ SOLN
INTRAMUSCULAR | Status: DC | PRN
  Administered 2015-05-13: 8 mL via EPIDURAL
  Administered 2015-05-13: 9 mL via EPIDURAL

## 2015-05-13 MED ORDER — OXYTOCIN 40 UNITS IN LACTATED RINGERS INFUSION - SIMPLE MED
62.5000 mL/h | INTRAVENOUS | Status: DC
  Administered 2015-05-13: 62.5 mL/h via INTRAVENOUS
  Filled 2015-05-13: qty 1000

## 2015-05-13 MED ORDER — ONDANSETRON HCL 4 MG/2ML IJ SOLN: 4.0000 mg | INTRAMUSCULAR | Status: DC | PRN

## 2015-05-13 MED ORDER — DIPHENHYDRAMINE HCL 50 MG/ML IJ SOLN: 12.5000 mg | INTRAMUSCULAR | Status: DC | PRN

## 2015-05-13 MED ORDER — IBUPROFEN 600 MG PO TABS
600.0000 mg | ORAL_TABLET | Freq: Four times a day (QID) | ORAL | Status: DC
  Administered 2015-05-13 – 2015-05-15 (×8): 600 mg via ORAL
  Filled 2015-05-13 (×8): qty 1

## 2015-05-13 MED ORDER — FENTANYL 2.5 MCG/ML BUPIVACAINE 1/10 % EPIDURAL INFUSION (WH - ANES)
14.0000 mL/h | INTRAMUSCULAR | Status: DC | PRN
  Administered 2015-05-13 (×2): 14 mL/h via EPIDURAL
  Filled 2015-05-13: qty 125

## 2015-05-13 MED ORDER — OXYTOCIN BOLUS FROM INFUSION: 500.0000 mL | INTRAVENOUS | Status: DC

## 2015-05-13 MED ORDER — ACETAMINOPHEN 325 MG PO TABS: 650.0000 mg | ORAL_TABLET | ORAL | Status: DC | PRN

## 2015-05-13 MED ORDER — DIBUCAINE 1 % RE OINT: 1.0000 "application " | TOPICAL_OINTMENT | RECTAL | Status: DC | PRN

## 2015-05-13 MED ORDER — LANOLIN HYDROUS EX OINT: TOPICAL_OINTMENT | CUTANEOUS | Status: DC | PRN

## 2015-05-13 MED ORDER — LACTATED RINGERS IV SOLN
500.0000 mL | INTRAVENOUS | Status: DC | PRN
Start: 2015-05-13 — End: 2015-05-13

## 2015-05-13 MED ORDER — LACTATED RINGERS IV SOLN
INTRAVENOUS | Status: DC
  Administered 2015-05-13 (×2): via INTRAVENOUS

## 2015-05-13 MED ORDER — FENTANYL CITRATE (PF) 100 MCG/2ML IJ SOLN: 100.0000 ug | INTRAMUSCULAR | Status: DC | PRN

## 2015-05-13 MED ORDER — CITRIC ACID-SODIUM CITRATE 334-500 MG/5ML PO SOLN: 30.0000 mL | ORAL | Status: DC | PRN

## 2015-05-13 MED ORDER — LIDOCAINE HCL (PF) 1 % IJ SOLN
30.0000 mL | INTRAMUSCULAR | Status: DC | PRN
Start: 2015-05-13 — End: 2015-05-13
  Filled 2015-05-13: qty 30

## 2015-05-13 MED ORDER — ONDANSETRON HCL 4 MG/2ML IJ SOLN: 4.0000 mg | Freq: Four times a day (QID) | INTRAMUSCULAR | Status: DC | PRN

## 2015-05-13 MED ORDER — EPHEDRINE 5 MG/ML INJ
10.0000 mg | INTRAVENOUS | Status: DC | PRN
  Filled 2015-05-13: qty 2

## 2015-05-13 MED ORDER — BENZOCAINE-MENTHOL 20-0.5 % EX AERO
1.0000 "application " | INHALATION_SPRAY | CUTANEOUS | Status: DC | PRN
  Administered 2015-05-13: 1 via TOPICAL
  Filled 2015-05-13: qty 56

## 2015-05-13 MED ORDER — PHENYLEPHRINE 40 MCG/ML (10ML) SYRINGE FOR IV PUSH (FOR BLOOD PRESSURE SUPPORT)
80.0000 ug | PREFILLED_SYRINGE | INTRAVENOUS | Status: DC | PRN
  Filled 2015-05-13: qty 2
  Filled 2015-05-13: qty 20

## 2015-05-13 MED ORDER — SIMETHICONE 80 MG PO CHEW: 80.0000 mg | CHEWABLE_TABLET | ORAL | Status: DC | PRN

## 2015-05-13 MED ORDER — FLEET ENEMA 7-19 GM/118ML RE ENEM: 1.0000 | ENEMA | RECTAL | Status: DC | PRN

## 2015-05-13 MED ORDER — OXYCODONE-ACETAMINOPHEN 5-325 MG PO TABS: 1.0000 | ORAL_TABLET | ORAL | Status: DC | PRN

## 2015-05-13 MED ORDER — ACETAMINOPHEN 325 MG PO TABS
650.0000 mg | ORAL_TABLET | ORAL | Status: DC | PRN
Start: 2015-05-13 — End: 2015-05-15
  Administered 2015-05-14: 650 mg via ORAL
  Filled 2015-05-13: qty 2

## 2015-05-13 MED ORDER — DOCUSATE SODIUM 100 MG PO CAPS
100.0000 mg | ORAL_CAPSULE | Freq: Two times a day (BID) | ORAL | Status: DC
  Administered 2015-05-13 – 2015-05-15 (×4): 100 mg via ORAL
  Filled 2015-05-13 (×5): qty 1

## 2015-05-13 MED ORDER — OXYCODONE-ACETAMINOPHEN 5-325 MG PO TABS: 2.0000 | ORAL_TABLET | ORAL | Status: DC | PRN

## 2015-05-13 NOTE — MAU Note (Signed)
Resident will come down after rounds and will recheck patient cervix

## 2015-05-13 NOTE — MAU Note (Signed)
Dr Jaquita RectorMelancon in pt room-discussing with patient that we will have her walk and return around 7 and have cervix rechecked. Fetal heart monitors removed by Dr.

## 2015-05-13 NOTE — Anesthesia Procedure Notes (Signed)
Epidural Patient location during procedure: OB Start time: 05/13/2015 10:23 AM End time: 05/13/2015 10:27 AM  Staffing Anesthesiologist: Leilani AbleHATCHETT, Roston Grunewald  Preanesthetic Checklist Completed: patient identified, surgical consent, pre-op evaluation, timeout performed, IV checked, risks and benefits discussed and monitors and equipment checked  Epidural Patient position: sitting Prep: site prepped and draped and DuraPrep Patient monitoring: continuous pulse ox and blood pressure Approach: midline Location: L3-L4 Injection technique: LOR air  Needle:  Needle type: Tuohy  Needle gauge: 17 G Needle length: 9 cm and 9 Needle insertion depth: 5 cm cm Catheter type: closed end flexible Catheter size: 19 Gauge Catheter at skin depth: 10 cm Test dose: negative and Other  Assessment Sensory level: T9 Events: blood not aspirated, injection not painful, no injection resistance, negative IV test and no paresthesia  Additional Notes Reason for block:procedure for pain

## 2015-05-13 NOTE — MAU Note (Signed)
Patient requested to be taken off efm.

## 2015-05-13 NOTE — MAU Note (Signed)
Pt return from walking-hurting too much

## 2015-05-13 NOTE — MAU Note (Signed)
Contractions since 2am-every 5 mins. Denies LOF. JHaving some vag spotting-checked yesterday and was 3cm. +FM

## 2015-05-13 NOTE — Lactation Note (Signed)
This note was copied from the chart of Kelli Florestine Fite. Lactation Consultation Note Initial visit at 7 hours of age, baby is 6334w4d and 5#6oz.   Mom reports a few good feedings and denies pain with latch.  Baby is asleep in crib.  Mom has large bulbous flat nipples with easily compressible breast tissue.  Mom has easily expressible colostrum, encouraged to express before feedings and after for nipple care.  [facility] given and discussed.  Encouraged to feed with early cues on demand.  Early newborn behavior discussed.  Discussed due to size and gestational age, baby may tire out with feedings.  Encouraged mom to hand express and offer spoon feedings if needed.  Mom plans to buy a DEBP, she does not have WIC and plans to do more pumping and bottle feedings.  Encouraged mom to work on establishing a milk supply first.    Mom to call for assist as needed.       Patient Name: Kelli Soto ZOXWR'UToday's Date: 05/13/2015 Reason for consult: Initial assessment;Infant < 6lbs   Maternal Data Has patient been taught Hand Expression?: Yes Does the patient have breastfeeding experience prior to this delivery?: No  Feeding Feeding Type: Breast Fed Length of feed: 5 min  LATCH Score/Interventions                      Lactation Tools Discussed/Used WIC Program: No   Consult Status Consult Status: Follow-up Date: 05/14/15 Follow-up type: In-patient    Shoptaw, Kelli Soto 05/13/2015, 8:59 PM

## 2015-05-13 NOTE — Anesthesia Preprocedure Evaluation (Signed)
Anesthesia Evaluation  Patient identified by MRN, date of birth, ID band Patient awake    Reviewed: Allergy & Precautions, H&P , NPO status , Patient's Chart, lab work & pertinent test results  Airway Mallampati: I  TM Distance: >3 FB Neck ROM: full    Dental no notable dental hx.    Pulmonary neg pulmonary ROS,    Pulmonary exam normal       Cardiovascular negative cardio ROS Normal cardiovascular exam    Neuro/Psych negative neurological ROS  negative psych ROS   GI/Hepatic negative GI ROS, Neg liver ROS,   Endo/Other  negative endocrine ROS  Renal/GU negative Renal ROS     Musculoskeletal   Abdominal Normal abdominal exam  (+)   Peds  Hematology negative hematology ROS (+)   Anesthesia Other Findings   Reproductive/Obstetrics (+) Pregnancy                             Anesthesia Physical Anesthesia Plan  ASA: II  Anesthesia Plan: Epidural   Post-op Pain Management:    Induction:   Airway Management Planned:   Additional Equipment:   Intra-op Plan:   Post-operative Plan:   Informed Consent: I have reviewed the patients History and Physical, chart, labs and discussed the procedure including the risks, benefits and alternatives for the proposed anesthesia with the patient or authorized representative who has indicated his/her understanding and acceptance.     Plan Discussed with:   Anesthesia Plan Comments:         Anesthesia Quick Evaluation  

## 2015-05-13 NOTE — H&P (Signed)
Kelli Soto is a 19 y.o. female G1P0 with IUP at [redacted]w[redacted]d presenting for contractions. Pt states she has been having regular, every 5 minutes contractions, associated with spotting vaginal bleeding.  Membranes are intact, with active fetal movement.   [facility] at [facility] since 9 wks.  - GBS pos - Rubella non-immune - SGA infant. EFW 14% at 37W, BPP 8/8, normal AFI.  - Girl / Breast / candidate for in house Nexplanon, but undecided.   Prenatal History/Complications:  [facility] Prenatal Labs  Dating LMP and 9 wk us Blood type: O/POS/-- (05/11 1520)O pos  Genetic Screen Declined first trimester screen but had normal NT and NB Antibody:NEG (05/11 1520)neg  Anatomic US WNL Rubella: 0.42 (05/11 1520)non immune  GTT Third trimester:  RPR: NON REAC (09/27 1440) NR  Flu vaccine  declines HBsAg: NEGATIVE (05/11 1520) neg  TDaP vaccine  declines                                         HIV: NONREACTIVE (09/27 1440) NR  GBS   + in urine. Treat in labor.                                           GBS:   Contraception Offer in house Nexplanon Pap: N/A  [employer] breast   Circumcision girl   Pediatrician    Support Person FOB, mom and FOB's mom     Past Medical History: Past Medical History  Diagnosis Date  . Bronchitis     Past Surgical History: Past Surgical History  Procedure Laterality Date  . No past surgeries      Obstetrical History: OB History    Gravida Para Term Preterm AB TAB SAB Ectopic Multiple Living   1         0      Social History: Social History   Social History  . Marital Status: Single    Spouse Name: N/A  . Number of Children: N/A  . Years of Education: N/A   Social History Main Topics  . Smoking status: Never Smoker   . Smokeless tobacco: None  . Alcohol Use: No  . Drug Use: No  . Sexual Activity: Not Asked   Other Topics Concern  . None   Social History Narrative    Family History: Family History  Problem Relation Age of Onset  . Hypertension Mother    . Diabetes Other     Allergies: No Known Allergies  Prescriptions prior to admission  Medication Sig Dispense Refill Last Dose  . Prenat w/o A Vit-FeFum-FePo-FA (CONCEPT OB) 130-92.4-1 MG CAPS Take 1 capsule by mouth daily. (Patient not taking: Reported on 05/12/2015) 30 capsule 11 More than a month at Unknown time     Review of Systems   Constitutional: negative.   Blood pressure 130/85, pulse 109, temperature 97.9 F (36.6 C), temperature source Oral, resp. rate 18, height 5' 3" (1.6 m), weight 164 lb (74.39 kg), last menstrual period 08/23/2014, SpO2 100 %. General appearance: alert, cooperative and appears stated age Lungs: clear to auscultation bilaterally Heart: regular rate and rhythm Abdomen: soft, non-tender; bowel sounds normal Pelvic: cervix 4cm, -1, 70-80%.  Extremities: Homans sign is negative, no sign of DVT Presentation: cephalic Fetal monitoringBaseline: 145 bpm, Variability:   Good {> 6 bpm), Accelerations: Reactive and Decelerations: Absent Uterine activityDate/time of onset: 05/12/2015 at 2pm: Every 5 minutes and Intensity: mild Dilation: 5.5 Effacement (%): 100 Station: -1 Exam by:: Dr. Newton   Prenatal labs: ABO, Rh: O/POS/-- (05/11 1520) Antibody: NEG (05/11 1520) Rubella:Non- Immune RPR: NON REAC (09/27 1440)  HBsAg: NEGATIVE (05/11 1520)  HIV: NONREACTIVE (09/27 1440)  GBS: Positive (05/11 0000)  1 hr Glucola 82 Genetic screening  Declined first trimester, but normal NT, NB.  Anatomy US normal.    Prenatal Transfer Tool  Maternal Diabetes: No Genetic Screening: Declined, noirmal NT, NB.  Maternal Ultrasounds/Referrals: Abnormal:  Findings:   Other:SGA Fetal Ultrasounds or other Referrals:  None Maternal Substance Abuse:  No Significant Maternal Medications:  None Significant Maternal Lab Results: Lab values include: Group B Strep positive, Rubella non-immune.    Results for orders placed or performed during the hospital encounter of  05/13/15 (from the past 24 hour(s))  Urinalysis, Routine w reflex microscopic (not at [facility])   Collection Time: 05/13/15  5:20 AM  Result Value Ref Range   Color, Urine YELLOW YELLOW   APPearance CLEAR CLEAR   Specific Gravity, Urine 1.010 1.005 - 1.030   pH 6.5 5.0 - 8.0   Glucose, UA NEGATIVE NEGATIVE mg/dL   Hgb urine dipstick NEGATIVE NEGATIVE   Bilirubin Urine NEGATIVE NEGATIVE   Ketones, ur NEGATIVE NEGATIVE mg/dL   Protein, ur NEGATIVE NEGATIVE mg/dL   Nitrite NEGATIVE NEGATIVE   Leukocytes, UA NEGATIVE NEGATIVE  CBC   Collection Time: 05/13/15  9:30 AM  Result Value Ref Range   WBC 6.1 4.0 - 10.5 K/uL   RBC 4.10 3.87 - 5.11 MIL/uL   Hemoglobin 11.7 (L) 12.0 - 15.0 g/dL   HCT 35.1 (L) 36.0 - 46.0 %   MCV 85.6 78.0 - 100.0 fL   MCH 28.5 26.0 - 34.0 pg   MCHC 33.3 30.0 - 36.0 g/dL   RDW 12.9 11.5 - 15.5 %   Platelets 202 150 - 400 K/uL    Assessment: Kelli Soto is a 19 y.o. G1P0 at [redacted]w[redacted]d by 37w u/s here for early active labor.  #Labor:expectant managment #Pain: Epidural #FWB: Cat I #ID:  GBS pos in urine, Ampicillin ordered when pat had ROM on presentation to the floor #MOF: breast #MOC: unsure, desires pregnancy in 5 years. Briefly discussed LARC #Circ:  N/a girl  #Ruebella non-immune: needs postpartum MMR  Kelli Soto 05/13/2015, 10:30 AM    

## 2015-05-14 LAB — RPR: RPR Ser Ql: NONREACTIVE

## 2015-05-14 NOTE — Lactation Note (Addendum)
This note was copied from the chart of Kelli Soto. Lactation Consultation Note  Patient Name: Kelli Soto Today's Date: 05/14/2015   Visited with Kelli Soto and FOB, baby 4725 hrs old.  Baby has cluster fed this morning, feeding X4 times at the breast (10-25 mins), >2 stools, >2 voids in first 24 hrs.  Baby has had some low temperatures last night, treated by skin to skin.  Baby lost 2% in 12 hrs.  Explained to parents how well they were doing with breast feeding.   Talked about post breast feeding pumping to support her milk supply, and how beneficial it would be to offer her expressed colostrum to baby.  Talked about dropper and spoon feeding. Set up DEBP with instructions.  Talked to her RN about this.  Kelli Soto did not wish to latch baby as she had just fed her.  Asked her to let her nurse know the next time she feeds baby, so we could observe and give a latch score.  Kelli Soto did not want to pump at this time, so procedure and cleaning explained.  Encouraged her to call out when she is ready to pump, for extra help.  To follow up in am.    Judee ClaraSmith, [name] E 05/14/2015, 2:54 PM

## 2015-05-14 NOTE — Anesthesia Postprocedure Evaluation (Signed)
Anesthesia Post Note  Patient: Kelli Soto  Procedure(s) Performed: * No procedures listed *  Patient location during evaluation: Mother Baby Anesthesia Type: Epidural Level of consciousness: awake, awake and alert and oriented Pain management: pain level controlled Vital Signs Assessment: post-procedure vital signs reviewed and stable Respiratory status: spontaneous breathing Cardiovascular status: blood pressure returned to baseline Postop Assessment: No headache, No backache, Patient able to bend at knees, No signs of nausea or vomiting and Adequate PO intake Anesthetic complications: no    Last Vitals:  Filed Vitals:   05/13/15 2028 05/14/15 0321  BP: 126/65 123/62  Pulse: 60 75  Temp: 36.6 C 36.7 C  Resp: 18 18    Last Pain:  Filed Vitals:   05/14/15 0325  PainSc: 4                  Jaylin Roundy

## 2015-05-14 NOTE — Progress Notes (Signed)
Post Partum Day 1 Subjective: no complaints, up ad lib, voiding, tolerating PO, + flatus and minimal pain, feeling well. Baby to stay for 48 hours for GBS status.   Objective: Blood pressure 123/62, pulse 75, temperature 98 F (36.7 C), temperature source Oral, resp. rate 18, height 5\' 3"  (1.6 m), weight 74.39 kg (164 lb), last menstrual period 08/23/2014, SpO2 100 %, unknown if currently breastfeeding.  Physical Exam:  General: alert, cooperative and no distress Lochia: appropriate Uterine Fundus: firm DVT Evaluation: No evidence of DVT seen on physical exam.   Recent Labs  05/13/15 0930  HGB 11.7*  HCT 35.1*    Assessment/Plan: Breastfeeding , Doing well postpartum.  - Continue current care.  - D/C after 48 hours with infant.  - Deciding about nexplanon.    LOS: 1 day   Caleb Melancon 05/14/2015, 7:08 AM   CNM attestation Post Partum Day #1 I have seen and examined this patient and agree with above documentation in the resident's note.   Nancylee A Bisping is a 19 y.o. G1P1001 s/p SVD.  Pt denies problems with ambulating, voiding or po intake. Pain is well controlled.  Plan for birth control is undecided, but considering Nexplanon.  Method of Feeding: breast  PE:  BP 123/62 mmHg  Pulse 75  Temp(Src) 98 F (36.7 C) (Oral)  Resp 18  Ht 5\' 3"  (1.6 m)  Wt 74.39 kg (164 lb)  BMI 29.06 kg/m2  SpO2 100%  LMP 08/23/2014 (Approximate)  Breastfeeding? Unknown Fundus firm  Plan for discharge: 05/15/15. Possibly will want to place Nex prior to d/c.  Cam HaiSHAW, KIMBERLY, CNM 11:13 AM  05/14/2015

## 2015-05-15 MED ORDER — IBUPROFEN 600 MG PO TABS: 600.0000 mg | ORAL_TABLET | Freq: Four times a day (QID) | ORAL | Status: DC

## 2015-05-15 NOTE — Lactation Note (Signed)
This note was copied from the chart of Kelli Soto. Lactation Consultation Note  Patient Name: Kelli Milinda Pointerletha Siefken [id number]'s Date: 05/15/2015 Reason for consult: Follow-up assessment;Infant < 6lbs   Visited with Mom and FOB this morning, baby 944 hrs old.  It had been 4 hrs since last feeding, but baby had fed 3 times during the night, and had 3 stools (green per Mom), and 3 voids.  Awakened baby to assist and assess baby on the breast.  Mom using football hold primarily on the left (as it is easier for her).  Talked about importance of alternating breasts at each feeding.  Mom has pumped one time, one breast.  Manual expression revealed what looked like transitional milk.  Mom latched baby onto right side with assistance, and needed stimulation after 5 minutes to stay rhythmic.  Switched to left side, and baby more alert and nutritive.  Lots of teaching on the importance of a deep areolar latch, and need to keep baby stimulated to feed often today.  Recommended lots of skin to skin, and trying to feed baby every 2 hrs during the day.  Mom to pump both breasts after every other breast feeding and to feed baby any expressed breast milk by spoon or dropper.  Has a double electric Medela pump at home.  Baby down almost 6% today.  Mom very receptive to teaching. To call prn for assistance.    Consult Status Consult Status: Follow-up Date: 05/15/15 Follow-up type: In-patient    Judee ClaraSmith, Chrissi Crow E 05/15/2015, 9:57 AM

## 2015-05-15 NOTE — Discharge Instructions (Signed)

## 2015-05-15 NOTE — Discharge Summary (Signed)
OB Discharge Summary     Patient Name: Kelli Soto DOB: 04-Dec-1995 MRN: [id number]  Date of admission: 05/13/2015 Delivering MD: Lyndel SafeNEWTON, KIMBERLY NILES   Date of discharge: 05/15/2015  Admitting diagnosis: 37 WKS, CTXS Intrauterine pregnancy: 2649w4d     Secondary diagnosis:  Principal Problem:   NSVD (normal spontaneous vaginal delivery) Active Problems:   Supervision of normal first teen pregnancy   Rubella non-immune status, antepartum   Group B streptococcus bacteria in urine complicating pregnancy   Small for gestational age (SGA)   Active labor  Additional problems: none     Discharge diagnosis: Term Pregnancy Delivered and Group B Strep Positive                                                                                                 Post partum procedures:none  Augmentation: none  Complications: None, inadequate GBS prophylaxis.   Hospital course:  Onset of Labor With Vaginal Delivery     19 y.o. yo G1P1001 at 9149w4d was admitted in Active Laboron 05/13/2015. Patient had an uncomplicated labor course as follows:  Membrane Rupture Time/Date: 9:50 AM ,05/13/2015   Intrapartum Procedures: Episiotomy: None [1]                                         Lacerations:  None [1]  Patient had a delivery of a Viable infant. 05/13/2015  Information for the patient's newborn:  Sharman CrateReaddy, Girl Milinda Pointerletha [[id number]][[id number]]  Delivery Method: Vaginal, Spontaneous Delivery (Filed from Delivery Summary)    Pateint had an uncomplicated postpartum course.  She is ambulating, tolerating a regular diet, passing flatus, and urinating well. Patient is discharged home in stable condition on 05/15/15.   Physical exam  Filed Vitals:   05/13/15 1620 05/13/15 2028 05/14/15 0321 05/14/15 1800  BP: 118/47 126/65 123/62 125/57  Pulse: 73 60 75 61  Temp: 98 F (36.7 C) 97.9 F (36.6 C) 98 F (36.7 C) 98.4 F (36.9 C)  TempSrc: Oral Oral  Oral  Resp: 18 18 18 19   Height:      Weight:       SpO2:       General: alert, cooperative and no distress Lochia: appropriate Uterine Fundus: firm Incision: N/A DVT Evaluation: No evidence of DVT seen on physical exam. Labs: Lab Results  Component Value Date   WBC 6.1 05/13/2015   HGB 11.7* 05/13/2015   HCT 35.1* 05/13/2015   MCV 85.6 05/13/2015   PLT 202 05/13/2015   CMP Latest Ref Rng 10/22/2014  Glucose 70 - 99 mg/dL 782(N110(H)  BUN 6 - 20 mg/dL 7  Creatinine 5.620.44 - 1.301.00 mg/dL 8.650.50  Sodium 784135 - 696145 mmol/L 135  Potassium 3.5 - 5.1 mmol/L 3.9  Chloride 101 - 111 mmol/L 105  CO2 22 - 32 mmol/L 25  Calcium 8.9 - 10.3 mg/dL 8.9  Total Protein 6.5 - 8.1 g/dL 7.0  Total Bilirubin 0.3 - 1.2 mg/dL 0.5  Alkaline Phos 38 - 126 U/L 42  AST 15 - 41 U/L 17  ALT 14 - 54 U/L 10(L)    Discharge instruction: per After Visit Summary and "Baby and Me Booklet".  After visit meds:    Medication List    TAKE these medications        ibuprofen 600 MG tablet  Commonly known as:  ADVIL,MOTRIN  Take 1 tablet (600 mg total) by mouth every 6 (six) hours.        Diet: routine diet  Activity: Advance as tolerated. Pelvic rest for 6 weeks.   Outpatient follow up:6 weeks Follow up Appt:Future Appointments Date Time Provider Department Center  05/17/2015 3:15 PM Reva Bores, MD [facility]  05/20/2015 10:00 AM [facility] NURSE [facility]  05/24/2015 3:00 PM Tereso Newcomer, MD [facility]  05/27/2015 10:00 AM [facility] NURSE [facility]   Follow up Visit:No Follow-up on file.  Postpartum contraception: Undecided  Newborn Data: Live born female  Birth Weight: 5 lb 6.4 oz (2450 g) APGAR: 9, 9  Baby Feeding: Breast Disposition:home with mother   05/15/2015 Devota Pace, MD  CNM attestation I have seen and examined this patient and agree with above documentation in the resident's note.   Kelli Soto is a 19 y.o. G1P1001 s/p SVD.   Pain is well controlled.  Plan for birth control is  no method.  Method of Feeding: breast  PE:  BP 105/40 mmHg  Pulse 58  Temp(Src) 97.8 F (36.6 C) (Oral)  Resp 16  Ht  (1.6 m)  Wt 74.39 kg (164 lb)  BMI 29.06 kg/m2  SpO2 98%  LMP 08/23/2014 (Approximate)  Breastfeeding? Unknown Fundus firm   Recent Labs  05/13/15 0930  HGB 11.7*  HCT 35.1*     Plan: discharge today - postpartum care discussed - f/u clinic in 6 weeks for postpartum visit   Cam Hai, CNM 8:28 AM  05/15/2015

## 2015-05-16 ENCOUNTER — Ambulatory Visit (HOSPITAL_COMMUNITY): Payer: Medicaid Other

## 2015-05-17 ENCOUNTER — Encounter: Payer: Medicaid Other | Admitting: Family Medicine

## 2015-05-20 ENCOUNTER — Other Ambulatory Visit: Payer: Medicaid Other

## 2015-05-24 ENCOUNTER — Encounter: Payer: Medicaid Other | Admitting: Obstetrics & Gynecology

## 2015-05-26 NOTE — Progress Notes (Signed)
Post discharge chart review completed.  

## 2015-05-27 ENCOUNTER — Other Ambulatory Visit: Payer: Medicaid Other

## 2015-06-24 ENCOUNTER — Ambulatory Visit: Payer: Medicaid Other | Admitting: Obstetrics & Gynecology

## 2015-06-28 ENCOUNTER — Encounter: Payer: Self-pay | Admitting: Family Medicine

## 2015-06-28 ENCOUNTER — Ambulatory Visit (INDEPENDENT_AMBULATORY_CARE_PROVIDER_SITE_OTHER): Payer: Medicaid Other | Admitting: Family Medicine

## 2015-06-28 VITALS — BP 118/79 | HR 68 | Resp 18 | Ht 63.0 in | Wt 158.0 lb

## 2015-06-28 DIAGNOSIS — Z30018 Encounter for initial prescription of other contraceptives: Secondary | ICD-10-CM

## 2015-06-28 DIAGNOSIS — Z3202 Encounter for pregnancy test, result negative: Secondary | ICD-10-CM | POA: Diagnosis not present

## 2015-06-28 DIAGNOSIS — G44219 Episodic tension-type headache, not intractable: Secondary | ICD-10-CM

## 2015-06-28 LAB — POCT URINE PREGNANCY: PREG TEST UR: NEGATIVE

## 2015-06-28 MED ORDER — CYCLOBENZAPRINE HCL 10 MG PO TABS: 10.0000 mg | ORAL_TABLET | Freq: Three times a day (TID) | ORAL | Status: DC | PRN

## 2015-06-28 NOTE — Progress Notes (Signed)
Pt here today for postpartum check, pt still experiencing lower back pain, has been using Ibuprofen for the pain, states it does not help much.

## 2015-06-28 NOTE — Patient Instructions (Signed)
Levonorgestrel intrauterine device (IUD)  What is this medicine?  LEVONORGESTREL IUD (LEE voe nor jes trel) is a contraceptive (birth control) device. The device is placed inside the uterus by a healthcare professional. It is used to prevent pregnancy and can also be used to treat heavy bleeding that occurs during your period. Depending on the device, it can be used for 3 to 5 years.  This medicine may be used for other purposes; ask your health care provider or pharmacist if you have questions.  What should I tell my health care provider before I take this medicine?  They need to know if you have any of these conditions:  -abnormal Pap smear  -cancer of the breast, uterus, or cervix  -diabetes  -endometritis  -genital or pelvic infection now or in the past  -have more than one sexual partner or your partner has more than one partner  -heart disease  -history of an ectopic or tubal pregnancy  -immune system problems  -IUD in place  -liver disease or tumor  -problems with blood clots or take blood-thinners  -use intravenous drugs  -uterus of unusual shape  -vaginal bleeding that has not been explained  -an unusual or allergic reaction to levonorgestrel, other hormones, silicone, or polyethylene, medicines, foods, dyes, or preservatives  -pregnant or trying to get pregnant  -breast-feeding  How should I use this medicine?  This device is placed inside the uterus by a health care professional.  Talk to your pediatrician regarding the use of this medicine in children. Special care may be needed.  Overdosage: If you think you have taken too much of this medicine contact a poison control center or emergency room at once.  NOTE: This medicine is only for you. Do not share this medicine with others.  What if I miss a dose?  This does not apply.  What may interact with this medicine?  Do not take this medicine with any of the following medications:  -amprenavir  -bosentan  -fosamprenavir  This medicine may also interact with  the following medications:  -aprepitant  -barbiturate medicines for inducing sleep or treating seizures  -bexarotene  -griseofulvin  -medicines to treat seizures like carbamazepine, ethotoin, felbamate, oxcarbazepine, phenytoin, topiramate  -modafinil  -pioglitazone  -rifabutin  -rifampin  -rifapentine  -some medicines to treat HIV infection like atazanavir, indinavir, lopinavir, nelfinavir, tipranavir, ritonavir  -St. John's wort  -warfarin  This list may not describe all possible interactions. Give your health care provider a list of all the medicines, herbs, non-prescription drugs, or dietary supplements you use. Also tell them if you smoke, drink alcohol, or use illegal drugs. Some items may interact with your medicine.  What should I watch for while using this medicine?  Visit your doctor or health care professional for regular check ups. See your doctor if you or your partner has sexual contact with others, becomes HIV positive, or gets a sexual transmitted disease.  This product does not protect you against HIV infection (AIDS) or other sexually transmitted diseases.  You can check the placement of the IUD yourself by reaching up to the top of your vagina with clean fingers to feel the threads. Do not pull on the threads. It is a good habit to check placement after each menstrual period. Call your doctor right away if you feel more of the IUD than just the threads or if you cannot feel the threads at all.  The IUD may come out by itself. You may become pregnant if   the device comes out. If you notice that the IUD has come out use a backup birth control method like condoms and call your health care provider.  Using tampons will not change the position of the IUD and are okay to use during your period.  What side effects may I notice from receiving this medicine?  Side effects that you should report to your doctor or health care professional as soon as possible:  -allergic reactions like skin rash, itching or  hives, swelling of the face, lips, or tongue  -fever, flu-like symptoms  -genital sores  -high blood pressure  -no menstrual period for 6 weeks during use  -pain, swelling, warmth in the leg  -pelvic pain or tenderness  -severe or sudden headache  -signs of pregnancy  -stomach cramping  -sudden shortness of breath  -trouble with balance, talking, or walking  -unusual vaginal bleeding, discharge  -yellowing of the eyes or skin  Side effects that usually do not require medical attention (report to your doctor or health care professional if they continue or are bothersome):  -acne  -breast pain  -change in sex drive or performance  -changes in weight  -cramping, dizziness, or faintness while the device is being inserted  -headache  -irregular menstrual bleeding within first 3 to 6 months of use  -nausea  This list may not describe all possible side effects. Call your doctor for medical advice about side effects. You may report side effects to FDA at 1-800-FDA-1088.  Where should I keep my medicine?  This does not apply.  NOTE: This sheet is a summary. It may not cover all possible information. If you have questions about this medicine, talk to your doctor, pharmacist, or health care provider.     © 2016, [employer]. (2011-07-05 13:54:04)  Etonogestrel implant  What is this medicine?  ETONOGESTREL (et oh noe JES trel) is a contraceptive (birth control) device. It is used to prevent pregnancy. It can be used for up to 3 years.  This medicine may be used for other purposes; ask your health care provider or pharmacist if you have questions.  What should I tell my health care provider before I take this medicine?  They need to know if you have any of these conditions:  -abnormal vaginal bleeding  -blood vessel disease or blood clots  -cancer of the breast, cervix, or liver  -depression  -diabetes  -gallbladder disease  -headaches  -heart disease or recent heart attack  -high blood pressure  -high  cholesterol  -kidney disease  -liver disease  -renal disease  -seizures  -tobacco smoker  -an unusual or allergic reaction to etonogestrel, other hormones, anesthetics or antiseptics, medicines, foods, dyes, or preservatives  -pregnant or trying to get pregnant  -breast-feeding  How should I use this medicine?  This device is inserted just under the skin on the inner side of your upper arm by a health care professional.  Talk to your pediatrician regarding the use of this medicine in children. Special care may be needed.  Overdosage: If you think you have taken too much of this medicine contact a poison control center or emergency room at once.  NOTE: This medicine is only for you. Do not share this medicine with others.  What if I miss a dose?  This does not apply.  What may interact with this medicine?  Do not take this medicine with any of the following medications:  -amprenavir  -bosentan  -fosamprenavir  This medicine may also   interact with the following medications:  -barbiturate medicines for inducing sleep or treating seizures  -certain medicines for fungal infections like ketoconazole and itraconazole  -griseofulvin  -medicines to treat seizures like carbamazepine, felbamate, oxcarbazepine, phenytoin, topiramate  -modafinil  -phenylbutazone  -rifampin  -some medicines to treat HIV infection like atazanavir, indinavir, lopinavir, nelfinavir, tipranavir, ritonavir  -St. John's wort  This list may not describe all possible interactions. Give your health care provider a list of all the medicines, herbs, non-prescription drugs, or dietary supplements you use. Also tell them if you smoke, drink alcohol, or use illegal drugs. Some items may interact with your medicine.  What should I watch for while using this medicine?  This product does not protect you against HIV infection (AIDS) or other sexually transmitted diseases.  You should be able to feel the implant by pressing your fingertips over the skin where it  was inserted. Contact your doctor if you cannot feel the implant, and use a non-hormonal birth control method (such as condoms) until your doctor confirms that the implant is in place. If you feel that the implant may have broken or become bent while in your arm, contact your healthcare provider.  What side effects may I notice from receiving this medicine?  Side effects that you should report to your doctor or health care professional as soon as possible:  -allergic reactions like skin rash, itching or hives, swelling of the face, lips, or tongue  -breast lumps  -changes in emotions or moods  -depressed mood  -heavy or prolonged menstrual bleeding  -pain, irritation, swelling, or bruising at the insertion site  -scar at site of insertion  -signs of infection at the insertion site such as fever, and skin redness, pain or discharge  -signs of pregnancy  -signs and symptoms of a blood clot such as breathing problems; changes in vision; chest pain; severe, sudden headache; pain, swelling, warmth in the leg; trouble speaking; sudden numbness or weakness of the face, arm or leg  -signs and symptoms of liver injury like dark yellow or brown urine; general ill feeling or flu-like symptoms; light-colored stools; loss of appetite; nausea; right upper belly pain; unusually weak or tired; yellowing of the eyes or skin  -unusual vaginal bleeding, discharge  -signs and symptoms of a stroke like changes in vision; confusion; trouble speaking or understanding; severe headaches; sudden numbness or weakness of the face, arm or leg; trouble walking; dizziness; loss of balance or coordination  Side effects that usually do not require medical attention (Report these to your doctor or health care professional if they continue or are bothersome.):  -acne  -back pain  -breast pain  -changes in weight  -dizziness  -general ill feeling or flu-like symptoms  -headache  -irregular menstrual bleeding  -nausea  -sore throat  -vaginal irritation  or inflammation  This list may not describe all possible side effects. Call your doctor for medical advice about side effects. You may report side effects to FDA at 1-800-FDA-1088.  Where should I keep my medicine?  This drug is given in a hospital or clinic and will not be stored at home.  NOTE: This sheet is a summary. It may not cover all possible information. If you have questions about this medicine, talk to your doctor, pharmacist, or health care provider.     © 2016, [employer] Standard. (2014-03-19 14:07:06)

## 2015-06-28 NOTE — Progress Notes (Signed)
  Subjective:     Kelli Soto is a 20 y.o. female who presents for a postpartum visit. She is 6 weeks postpartum following a spontaneous vaginal delivery. I have fully reviewed the prenatal and intrapartum course. The delivery was at 37 gestational weeks. Outcome: spontaneous vaginal delivery. Anesthesia: epidural. Postpartum course has been notable for some mood issues around the FOB. Baby's course has been normal. Baby is feeding by bottle - Similac with Iron. Bleeding no bleeding. Bowel function is normal. Bladder function is normal. Patient is sexually active. Contraception method is none. Postpartum depression screening: negative.  The following portions of the patient's history were reviewed and updated as appropriate: allergies, current medications, past family history, past medical history, past social history, past surgical history and problem list.  Review of Systems Pertinent items noted in HPI and remainder of comprehensive ROS otherwise negative.   Objective:    BP 118/79 mmHg  Pulse 68  Resp 18  Ht 5\' 3"  (1.6 m)  Wt 158 lb (71.668 kg)  BMI 28.00 kg/m2  General:  alert, cooperative and appears stated age       Physical Examination: Neck - supple, no significant adenopathy Chest - normal effort Abdomen - soft, nontender, nondistended, no masses or organomegaly  Assessment:     Normal postpartum exam. Pap smear not done at today's visit.   Plan:    1. Contraception: undecided between Nexplanon and IUD-info given 2. Pap at age 20 3. Follow up in: 2 weeks  For contraception insertion and to re-visit her mood or as needed.

## 2015-07-08 ENCOUNTER — Ambulatory Visit: Payer: Medicaid Other | Admitting: Family Medicine

## 2015-07-11 ENCOUNTER — Ambulatory Visit: Payer: Medicaid Other | Admitting: Family Medicine

## 2015-07-18 ENCOUNTER — Encounter: Payer: Self-pay | Admitting: Family Medicine

## 2015-07-18 ENCOUNTER — Ambulatory Visit (INDEPENDENT_AMBULATORY_CARE_PROVIDER_SITE_OTHER): Payer: Medicaid Other | Admitting: Family Medicine

## 2015-07-18 VITALS — BP 120/78 | HR 85 | Wt 163.0 lb

## 2015-07-18 DIAGNOSIS — Z30017 Encounter for initial prescription of implantable subdermal contraceptive: Secondary | ICD-10-CM

## 2015-07-18 DIAGNOSIS — Z01812 Encounter for preprocedural laboratory examination: Secondary | ICD-10-CM

## 2015-07-18 LAB — POCT URINE PREGNANCY: Preg Test, Ur: NEGATIVE

## 2015-07-18 NOTE — Patient Instructions (Signed)
Etonogestrel implant What is this medicine? ETONOGESTREL (et oh noe JES trel) is a contraceptive (birth control) device. It is used to prevent pregnancy. It can be used for up to 3 years. This medicine may be used for other purposes; ask your health care provider or pharmacist if you have questions. What should I tell my health care provider before I take this medicine? They need to know if you have any of these conditions: -abnormal vaginal bleeding -blood vessel disease or blood clots -cancer of the breast, cervix, or liver -depression -diabetes -gallbladder disease -headaches -heart disease or recent heart attack -high blood pressure -high cholesterol -kidney disease -liver disease -renal disease -seizures -tobacco smoker -an unusual or allergic reaction to etonogestrel, other hormones, anesthetics or antiseptics, medicines, foods, dyes, or preservatives -pregnant or trying to get pregnant -breast-feeding How should I use this medicine? This device is inserted just under the skin on the inner side of your upper arm by a health care professional. Talk to your pediatrician regarding the use of this medicine in children. Special care may be needed. Overdosage: If you think you have taken too much of this medicine contact a poison control center or emergency room at once. NOTE: This medicine is only for you. Do not share this medicine with others. What if I miss a dose? This does not apply. What may interact with this medicine? Do not take this medicine with any of the following medications: -amprenavir -bosentan -fosamprenavir This medicine may also interact with the following medications: -barbiturate medicines for inducing sleep or treating seizures -certain medicines for fungal infections like ketoconazole and itraconazole -griseofulvin -medicines to treat seizures like carbamazepine, felbamate, oxcarbazepine, phenytoin,  topiramate -modafinil -phenylbutazone -rifampin -some medicines to treat HIV infection like atazanavir, indinavir, lopinavir, nelfinavir, tipranavir, ritonavir -St. John's wort This list may not describe all possible interactions. Give your health care provider a list of all the medicines, herbs, non-prescription drugs, or dietary supplements you use. Also tell them if you smoke, drink alcohol, or use illegal drugs. Some items may interact with your medicine. What should I watch for while using this medicine? This product does not protect you against HIV infection (AIDS) or other sexually transmitted diseases. You should be able to feel the implant by pressing your fingertips over the skin where it was inserted. Contact your doctor if you cannot feel the implant, and use a non-hormonal birth control method (such as condoms) until your doctor confirms that the implant is in place. If you feel that the implant may have broken or become bent while in your arm, contact your healthcare provider. What side effects may I notice from receiving this medicine? Side effects that you should report to your doctor or health care professional as soon as possible: -allergic reactions like skin rash, itching or hives, swelling of the face, lips, or tongue -breast lumps -changes in emotions or moods -depressed mood -heavy or prolonged menstrual bleeding -pain, irritation, swelling, or bruising at the insertion site -scar at site of insertion -signs of infection at the insertion site such as fever, and skin redness, pain or discharge -signs of pregnancy -signs and symptoms of a blood clot such as breathing problems; changes in vision; chest pain; severe, sudden headache; pain, swelling, warmth in the leg; trouble speaking; sudden numbness or weakness of the face, arm or leg -signs and symptoms of liver injury like dark yellow or brown urine; general ill feeling or flu-like symptoms; light-colored stools; loss of  appetite; nausea; right upper belly   pain; unusually weak or tired; yellowing of the eyes or skin -unusual vaginal bleeding, discharge -signs and symptoms of a stroke like changes in vision; confusion; trouble speaking or understanding; severe headaches; sudden numbness or weakness of the face, arm or leg; trouble walking; dizziness; loss of balance or coordination Side effects that usually do not require medical attention (Report these to your doctor or health care professional if they continue or are bothersome.): -acne -back pain -breast pain -changes in weight -dizziness -general ill feeling or flu-like symptoms -headache -irregular menstrual bleeding -nausea -sore throat -vaginal irritation or inflammation This list may not describe all possible side effects. Call your doctor for medical advice about side effects. You may report side effects to FDA at 1-800-FDA-1088. Where should I keep my medicine? This drug is given in a hospital or clinic and will not be stored at home. NOTE: This sheet is a summary. It may not cover all possible information. If you have questions about this medicine, talk to your doctor, pharmacist, or health care provider.    2016, [employer] Standard. (2014-03-19 14:07:06)  

## 2015-07-18 NOTE — Progress Notes (Signed)
    Subjective:    Patient ID: Kelli Soto is a 20 y.o. female presenting with Contraception  on 07/18/2015  HPI: Here for Nexplanon insertion. No unprotected intercourse. Neg UPT.  Review of Systems  Constitutional: Negative for fever and chills.  Respiratory: Negative for shortness of breath.   Cardiovascular: Negative for chest pain.  Gastrointestinal: Negative for nausea, vomiting and abdominal pain.  Genitourinary: Negative for dysuria.  Skin: Negative for rash.      Objective:    BP 120/78 mmHg  Pulse 85  Wt 163 lb (73.936 kg)  LMP 07/04/2015  Breastfeeding? No Physical Exam  Constitutional: She is oriented to person, place, and time. She appears well-developed and well-nourished. No distress.  HENT:  Head: Normocephalic and atraumatic.  Eyes: No scleral icterus.  Neck: Neck supple.  Cardiovascular: Normal rate.   Pulmonary/Chest: Effort normal.  Abdominal: Soft.  Neurological: She is alert and oriented to person, place, and time.  Skin: Skin is warm and dry.  Psychiatric: She has a normal mood and affect.   Procedure: Patient given informed consent, signed copy in the chart, time out was performed. Pregnancy test was neg. Appropriate time out taken.  Patient's left arm was prepped and draped in the usual sterile fashion.. The ruler used to measure and mark insertion area.  Pt was prepped with alcohol swab and then injected with 3 cc of 1% lidocaine with epinephrine.  Pt was prepped with betadine, Nexplanon removed form packaging,  Device confirmed in needle, then inserted full length of needle and withdrawn per handbook instructions.  Pt insertion site covered with pressure dressing.   Minimal blood loss.  Pt tolerated the procedure well.       Assessment & Plan:  Nexplanon insertion - Plan: POCT urine pregnancy  Return if symptoms worsen or fail to improve.   PRATT,TANYA S 07/18/2015 2:06 PM

## 2015-08-04 ENCOUNTER — Encounter: Payer: Self-pay | Admitting: *Deleted

## 2015-08-18 ENCOUNTER — Telehealth: Payer: Self-pay | Admitting: *Deleted

## 2015-08-18 DIAGNOSIS — N926 Irregular menstruation, unspecified: Secondary | ICD-10-CM

## 2015-08-18 MED ORDER — NORGESTIMATE-ETH ESTRADIOL 0.25-35 MG-MCG PO TABS: 1.0000 | ORAL_TABLET | Freq: Every day | ORAL | Status: DC

## 2015-08-18 NOTE — Telephone Encounter (Signed)
-----   Message from Reva Bores, MD sent at 08/18/2015  8:17 AM EST ----- Regarding: RE: Advise Contact: [phone] Trial of Sprintec ----- Message -----    From: Gita Kudo, RN    Sent: 08/17/2015   1:59 PM      To: Reva Bores, MD Subject: FW: Advise                                     Can I send an OCP in for her to try and which one do you recommend? ----- Message -----    From: Olevia Bowens    Sent: 08/17/2015  10:16 AM      To: Gita Kudo, RN Subject: Advise                                         Having break through bleeding, currently has a Nexplanon, wants to talk to someone about it, I told her it was a common side effect with that birth control

## 2015-08-18 NOTE — Telephone Encounter (Signed)
Called pt, no answer, left message that medication was sent to pharmacy.

## 2015-08-29 ENCOUNTER — Other Ambulatory Visit (HOSPITAL_COMMUNITY)
Admission: RE | Admit: 2015-08-29 | Discharge: 2015-08-29 | Disposition: A | Discharge status: Home / Self Care | Payer: Medicaid Other | Source: Ambulatory Visit | Attending: Family Medicine | Admitting: Family Medicine

## 2015-08-29 ENCOUNTER — Other Ambulatory Visit (INDEPENDENT_AMBULATORY_CARE_PROVIDER_SITE_OTHER): Payer: Medicaid Other | Admitting: *Deleted

## 2015-08-29 DIAGNOSIS — N760 Acute vaginitis: Secondary | ICD-10-CM | POA: Insufficient documentation

## 2015-08-29 DIAGNOSIS — Z113 Encounter for screening for infections with a predominantly sexual mode of transmission: Secondary | ICD-10-CM

## 2015-08-29 NOTE — Progress Notes (Signed)
Pt came in office requesting screening for STD due to infidelity with significant other. Will send urine GC/CT, trich, and serum RPR.  Pt had HIV screen a few months ago in pregnancy, will recheck HIV status in a few months at next visit.

## 2015-08-30 LAB — URINE CYTOLOGY ANCILLARY ONLY
Chlamydia: NEGATIVE
Neisseria Gonorrhea: NEGATIVE
TRICH (WINDOWPATH): NEGATIVE

## 2015-09-01 LAB — RPR

## 2015-09-20 ENCOUNTER — Ambulatory Visit: Payer: Medicaid Other | Admitting: Obstetrics and Gynecology

## 2015-09-21 ENCOUNTER — Ambulatory Visit (INDEPENDENT_AMBULATORY_CARE_PROVIDER_SITE_OTHER): Payer: Medicaid Other | Admitting: Obstetrics and Gynecology

## 2015-09-21 ENCOUNTER — Encounter: Payer: Self-pay | Admitting: Obstetrics and Gynecology

## 2015-09-21 ENCOUNTER — Other Ambulatory Visit (HOSPITAL_COMMUNITY)
Admission: RE | Admit: 2015-09-21 | Discharge: 2015-09-21 | Disposition: A | Discharge status: Home / Self Care | Payer: Medicaid Other | Source: Ambulatory Visit | Attending: Obstetrics and Gynecology | Admitting: Obstetrics and Gynecology

## 2015-09-21 VITALS — BP 103/57 | HR 93 | Resp 18 | Wt 159.0 lb

## 2015-09-21 DIAGNOSIS — R112 Nausea with vomiting, unspecified: Secondary | ICD-10-CM

## 2015-09-21 DIAGNOSIS — Z113 Encounter for screening for infections with a predominantly sexual mode of transmission: Secondary | ICD-10-CM | POA: Diagnosis not present

## 2015-09-21 DIAGNOSIS — Z3046 Encounter for surveillance of implantable subdermal contraceptive: Secondary | ICD-10-CM | POA: Diagnosis not present

## 2015-09-21 DIAGNOSIS — N921 Excessive and frequent menstruation with irregular cycle: Secondary | ICD-10-CM

## 2015-09-21 DIAGNOSIS — Z975 Presence of (intrauterine) contraceptive device: Secondary | ICD-10-CM

## 2015-09-21 LAB — POCT URINE PREGNANCY: PREG TEST UR: NEGATIVE

## 2015-09-21 NOTE — Progress Notes (Signed)
Pt had Nexplanon inserted in January, c/o pelvic pain, irregular bleeding, itching and discomfort at the insertion site, headaches, chest pains, and vomiting. Has started a trial of Sprintec to help with the bleeding, is currently on the second pack.

## 2015-09-21 NOTE — Progress Notes (Signed)
Patient ID: Kelli Soto, female   DOB: 02/05/1996, 20 y.o.   MRN: [id number] 20 yo G1 presenting today for the evaluation of DUB with Nexplanon. Patient had the Nexplanon placed in 06/2015 and has been experiencing some DUB. She is currently medically treated with Sprintec and has completed her first pack. She reports improvement in her vaginal bleeding but reports some dysmenorrhea. She also reports some itching and discomfort at Nexplanon insertion site.   Past Medical History  Diagnosis Date  . Bronchitis    Past Surgical History  Procedure Laterality Date  . No past surgeries     Family History  Problem Relation Age of Onset  . Hypertension Mother   . Diabetes Other    Social History  Substance Use Topics  . Smoking status: Never Smoker   . Smokeless tobacco: None  . Alcohol Use: No   ROS See pertinent in HPI  Blood pressure 103/57, pulse 93, resp. rate 18, weight 159 lb (72.122 kg), last menstrual period 09/15/2015, not currently breastfeeding. GENERAL: Well-developed, well-nourished female in no acute distress.  ABDOMEN: Soft, nontender, nondistended.  EXTREMITIES: No cyanosis, clubbing, or edema, 2+ distal pulses. nexplanon insertion site intact without erythema, non tender to touch  A/P 20 yo with DUB associated with Nexplanon - Discussed with the patient that irregular bleeding is a normal side effect of the Nexplanon which can last for 9 months and has no impact on its effectiveness - Will change OCP to Lo loestrin (samples provided) - RTC prn

## 2015-09-22 LAB — URINE CYTOLOGY ANCILLARY ONLY
Chlamydia: POSITIVE — AB
NEISSERIA GONORRHEA: NEGATIVE

## 2015-09-23 ENCOUNTER — Telehealth: Payer: Self-pay | Admitting: *Deleted

## 2015-09-23 MED ORDER — AZITHROMYCIN 500 MG PO TABS: 1000.0000 mg | ORAL_TABLET | Freq: Once | ORAL | Status: DC

## 2015-09-23 NOTE — Telephone Encounter (Signed)
Informed pt of result, instructed pt on medication use and to refrain from intercourse until partner had been treated.  Pt acknowledged instructions.

## 2015-09-23 NOTE — Telephone Encounter (Signed)
-----   Message from Catalina AntiguaPeggy Constant, MD sent at 09/23/2015  9:06 AM EDT ----- Please inform patient of chlamydia infection. Rx e-prescribed. Her partner should be treated and she should abstain for 7 days following treatment  Thanks  Peggy

## 2015-09-23 NOTE — Addendum Note (Signed)
Addended by: Catalina AntiguaONSTANT, Khaled Herda on: 09/23/2015 09:07 AM   Modules accepted: Orders

## 2015-11-01 ENCOUNTER — Ambulatory Visit: Payer: Medicaid Other | Admitting: Obstetrics and Gynecology

## 2015-11-02 ENCOUNTER — Encounter: Payer: Self-pay | Admitting: Obstetrics & Gynecology

## 2015-11-02 ENCOUNTER — Ambulatory Visit (INDEPENDENT_AMBULATORY_CARE_PROVIDER_SITE_OTHER): Payer: Medicaid Other | Admitting: Obstetrics & Gynecology

## 2015-11-02 VITALS — BP 111/64 | HR 76 | Resp 18 | Ht 63.0 in | Wt 157.0 lb

## 2015-11-02 DIAGNOSIS — G44219 Episodic tension-type headache, not intractable: Secondary | ICD-10-CM

## 2015-11-02 DIAGNOSIS — R1013 Epigastric pain: Secondary | ICD-10-CM | POA: Diagnosis not present

## 2015-11-02 MED ORDER — BUTALBITAL-APAP-CAFFEINE 50-500-40 MG PO TABS: 1.0000 | ORAL_TABLET | ORAL | Status: DC | PRN

## 2015-11-02 MED ORDER — RANITIDINE HCL 150 MG PO TABS: 150.0000 mg | ORAL_TABLET | Freq: Two times a day (BID) | ORAL | Status: DC

## 2015-11-02 NOTE — Patient Instructions (Addendum)
General Headache Without Cause A headache is pain or discomfort felt around the head or neck area. The specific cause of a headache may not be found. There are many causes and types of headaches. A few common ones are:  Tension headaches.  Migraine headaches.  Cluster headaches.  Chronic daily headaches. HOME CARE INSTRUCTIONS  Watch your condition for any changes. Take these steps to help with your condition: Managing Pain  Take over-the-counter and prescription medicines only as told by your health care provider.  Lie down in a dark, quiet room when you have a headache.  If directed, apply ice to the head and neck area:  Put ice in a plastic bag.  Place a towel between your skin and the bag.  Leave the ice on for 20 minutes, 2-3 times per day.  Use a heating pad or hot shower to apply heat to the head and neck area as told by your health care provider.  Keep lights dim if bright lights bother you or make your headaches worse. Eating and Drinking  Eat meals on a regular schedule.  Limit alcohol use.  Decrease the amount of caffeine you drink, or stop drinking caffeine. General Instructions  Keep all follow-up visits as told by your health care provider. This is important.  Keep a headache journal to help find out what may trigger your headaches. For example, write down:  What you eat and drink.  How much sleep you get.  Any change to your diet or medicines.  Try massage or other relaxation techniques.  Limit stress.  Sit up straight, and do not tense your muscles.  Do not use tobacco products, including cigarettes, chewing tobacco, or e-cigarettes. If you need help quitting, ask your health care provider.  Exercise regularly as told by your health care provider.  Sleep on a regular schedule. Get 7-9 hours of sleep, or the amount recommended by your health care provider. SEEK MEDICAL CARE IF:   Your symptoms are not helped by medicine.  You have a  headache that is different from the usual headache.  You have nausea or you vomit.  You have a fever. SEEK IMMEDIATE MEDICAL CARE IF:   Your headache becomes severe.  You have repeated vomiting.  You have a stiff neck.  You have a loss of vision.  You have problems with speech.  You have pain in the eye or ear.  You have muscular weakness or loss of muscle control.  You lose your balance or have trouble walking.  You feel faint or pass out.  You have confusion.   This information is not intended to replace advice given to you by your health care provider. Make sure you discuss any questions you have with your health care provider.   Document Released: 06/04/2005 Document Revised: 02/23/2015 Document Reviewed: 09/27/2014 Elsevier Interactive Patient Education 2016 [employer] Gastroesophageal Reflux Disease, Adult Normally, food travels down the esophagus and stays in the stomach to be digested. However, when a person has gastroesophageal reflux disease (GERD), food and stomach acid move back up into the esophagus. When this happens, the esophagus becomes sore and inflamed. Over time, GERD can create small holes (ulcers) in the lining of the esophagus.  CAUSES This condition is caused by a problem with the muscle between the esophagus and the stomach (lower esophageal sphincter, or LES). Normally, the LES muscle closes after food passes through the esophagus to the stomach. When the LES is weakened or abnormal, it does not close  properly, and that allows food and stomach acid to go back up into the esophagus. The LES can be weakened by certain dietary substances, medicines, and medical conditions, including:  Tobacco use.  Pregnancy.  Having a hiatal hernia.  Heavy alcohol use.  Certain foods and beverages, such as coffee, chocolate, onions, and peppermint. RISK FACTORS This condition is more likely to develop in:  People who have an increased body weight.  People  who have connective tissue disorders.  People who use NSAID medicines. SYMPTOMS Symptoms of this condition include:  Heartburn.  Difficult or painful swallowing.  The feeling of having a lump in the throat.  Abitter taste in the mouth.  Bad breath.  Having a large amount of saliva.  Having an upset or bloated stomach.  Belching.  Chest pain.  Shortness of breath or wheezing.  Ongoing (chronic) cough or a night-time cough.  Wearing away of tooth enamel.  Weight loss. Different conditions can cause chest pain. Make sure to see your health care provider if you experience chest pain. DIAGNOSIS Your health care provider will take a medical history and perform a physical exam. To determine if you have mild or severe GERD, your health care provider may also monitor how you respond to treatment. You may also have other tests, including:  An endoscopy toexamine your stomach and esophagus with a small camera.  A test thatmeasures the acidity level in your esophagus.  A test thatmeasures how much pressure is on your esophagus.  A barium swallow or modified barium swallow to show the shape, size, and functioning of your esophagus. TREATMENT The goal of treatment is to help relieve your symptoms and to prevent complications. Treatment for this condition may vary depending on how severe your symptoms are. Your health care provider may recommend:  Changes to your diet.  Medicine.  Surgery. HOME CARE INSTRUCTIONS Diet  Follow a diet as recommended by your health care provider. This may involve avoiding foods and drinks such as:  Coffee and tea (with or without caffeine).  Drinks that containalcohol.  Energy drinks and sports drinks.  Carbonated drinks or sodas.  Chocolate and cocoa.  Peppermint and mint flavorings.  Garlic and onions.  Horseradish.  Spicy and acidic foods, including peppers, chili powder, curry powder, vinegar, hot sauces, and barbecue  sauce.  Citrus fruit juices and citrus fruits, such as oranges, lemons, and limes.  Tomato-based foods, such as red sauce, chili, salsa, and pizza with red sauce.  Fried and fatty foods, such as donuts, french fries, potato chips, and high-fat dressings.  High-fat meats, such as hot dogs and fatty cuts of red and white meats, such as rib eye steak, sausage, ham, and bacon.  High-fat dairy items, such as whole milk, butter, and cream cheese.  Eat small, frequent meals instead of large meals.  Avoid drinking large amounts of liquid with your meals.  Avoid eating meals during the 2-3 hours before bedtime.  Avoid lying down right after you eat.  Do not exercise right after you eat. General Instructions  Pay attention to any changes in your symptoms.  Take over-the-counter and prescription medicines only as told by your health care provider. Do not take aspirin, ibuprofen, or other NSAIDs unless your health care provider told you to do so.  Do not use any tobacco products, including cigarettes, chewing tobacco, and e-cigarettes. If you need help quitting, ask your health care provider.  Wear loose-fitting clothing. Do not wear anything tight around your waist that causes  pressure on your abdomen.  Raise (elevate) the head of your bed 6 inches (15cm).  Try to reduce your stress, such as with yoga or meditation. If you need help reducing stress, ask your health care provider.  If you are overweight, reduce your weight to an amount that is healthy for you. Ask your health care provider for guidance about a safe weight loss goal.  Keep all follow-up visits as told by your health care provider. This is important. SEEK MEDICAL CARE IF:  You have new symptoms.  You have unexplained weight loss.  You have difficulty swallowing, or it hurts to swallow.  You have wheezing or a persistent cough.  Your symptoms do not improve with treatment.  You have a hoarse voice. SEEK  IMMEDIATE MEDICAL CARE IF:  You have pain in your arms, neck, jaw, teeth, or back.  You feel sweaty, dizzy, or light-headed.  You have chest pain or shortness of breath.  You vomit and your vomit looks like blood or coffee grounds.  You faint.  Your stool is bloody or black.  You cannot swallow, drink, or eat.   This information is not intended to replace advice given to you by your health care provider. Make sure you discuss any questions you have with your health care provider.   Document Released: 03/14/2005 Document Revised: 02/23/2015 Document Reviewed: 09/29/2014 Elsevier Interactive Patient Education [employer]

## 2015-11-02 NOTE — Progress Notes (Signed)
Patient ID: Arletta BaleAletha A Schelling, female   DOB: 1996-05-24, 20 y.o.   MRN: [id number] History:  20 y.o. G1P1001 here today for eval of epigastric pain and HA.  Pt reports that HA is daily and bandlike. Pt taking Tylenol regularly.   She denies phono pr photo phobia.  She reports mid epigastric pain that sometimes feel like pressure in her mid chest. Takes Tylenol for relief with no positive results.   The following portions of the patient's history were reviewed and updated as appropriate: allergies, current medications, past family history, past medical history, past social history, past surgical history and problem list.  Review of Systems:  Pertinent items are noted in HPI.  Objective:  Physical Exam Blood pressure 111/64, pulse 76, resp. rate 18, height 5\' 3"  (1.6 m), weight 157 lb (71.215 kg), last menstrual period 10/24/2015, not currently breastfeeding. Gen: NAD Lungs: CTA CV: RRR Abd: Soft, nontender and nondistended.  +mid epigastric tenderness.  No RUQ tenderness. Pelvic:  Not done  Labs and Imaging No results found.  Assessment & Plan:  A/P HA- tension type  Fioricet 1-2 po q 6 hours prn  F/u with optometrist for vision check  Epigastric pain- suspect GERD  Zantac 150mg  po bid  F/u in 3 months or sooner prn  Lavern Maslow L. Harraway-Smith, M.D., Evern CoreFACOG

## 2015-11-02 NOTE — Progress Notes (Signed)
Pt started experiencing mid upper abdominal pain a few months ago that is sharp and sudden and occurs on and off.  Also c/o headaches and having chest tightness and heaviness.

## 2015-12-23 ENCOUNTER — Ambulatory Visit (INDEPENDENT_AMBULATORY_CARE_PROVIDER_SITE_OTHER): Payer: Medicaid Other | Admitting: Physician Assistant

## 2015-12-23 ENCOUNTER — Encounter: Payer: Self-pay | Admitting: Physician Assistant

## 2015-12-23 VITALS — BP 103/54 | HR 58 | Resp 16 | Ht 63.0 in | Wt 162.0 lb

## 2015-12-23 DIAGNOSIS — G43009 Migraine without aura, not intractable, without status migrainosus: Secondary | ICD-10-CM

## 2015-12-23 DIAGNOSIS — G4489 Other headache syndrome: Secondary | ICD-10-CM | POA: Diagnosis not present

## 2015-12-23 MED ORDER — SUMATRIPTAN SUCCINATE 50 MG PO TABS: 50.0000 mg | ORAL_TABLET | ORAL | Status: DC | PRN

## 2015-12-23 MED ORDER — TOPIRAMATE 25 MG PO TABS: 25.0000 mg | ORAL_TABLET | Freq: Every day | ORAL | Status: DC

## 2015-12-23 MED ORDER — METOCLOPRAMIDE HCL 10 MG PO TABS: 10.0000 mg | ORAL_TABLET | Freq: Three times a day (TID) | ORAL | Status: DC | PRN

## 2015-12-23 NOTE — Progress Notes (Signed)
Patient ID: Arletta BaleAletha A Soto, female   DOB: 1996/03/27, 20 y.o.   MRN: [id number] History:  Kelli Soto is a 20 y.o. G1P1001 who presents to clinic today for new evaluation of headaches.  She has had headaches for years that could be treated with Tylenol, but since getting Nexplanon, they have changed.  She had it placed in January of this year.  Her headaches started in March.  She had a baby in November and breastfed until February.  She also started taking birth control pills in Feb or March.  She started with Sprintec for a month or two before discontinuing due to painful cramps.   The headache is moderate at its worst.  It is located bilat temporal and frontal.  Sometimes it is only one side.  After the pain starts she notes blurry vision.  It feels like a pulling sensation with throbbine.  Movement makes it worse.  Napping does not help.  She has worsening with loud noises but no sensitivity to light noted.  She feels nauseous with the HA.    HIT6:63 Number of days in the last 4 weeks with: Pt cannot quantify Severe headache? Moderate headache: ? Mild headache: ? No headache: ?   Past Medical History  Diagnosis Date  . Bronchitis     Social History   Social History  . Marital Status: Single    Spouse Name: N/A  . Number of Children: N/A  . Years of Education: N/A   Occupational History  . Not on file.   Social History Main Topics  . Smoking status: Never Smoker   . Smokeless tobacco: Not on file  . Alcohol Use: No  . Drug Use: No  . Sexual Activity: Yes    Birth Control/ Protection: Implant   Other Topics Concern  . Not on file   Social History Narrative    Family History  Problem Relation Age of Onset  . Hypertension Mother   . Diabetes Other     No Known Allergies  Current Outpatient Prescriptions on File Prior to Visit  Medication Sig Dispense Refill  . acetaminophen (TYLENOL) 500 MG tablet Take 500 mg by mouth every 6 (six) hours as needed.    .  etonogestrel (NEXPLANON) 68 MG IMPL implant 1 each by Subdermal route once.    Marland Kitchen. ibuprofen (ADVIL,MOTRIN) 600 MG tablet Take 1 tablet (600 mg total) by mouth every 6 (six) hours. 30 tablet 0  . ranitidine (ZANTAC) 150 MG tablet Take 1 tablet (150 mg total) by mouth 2 (two) times daily. 60 tablet 3  . butalbital-acetaminophen-caffeine (ESGIC PLUS) 50-500-40 MG tablet Take 1-2 tablets by mouth every 4 (four) hours as needed for pain or headache. (Patient not taking: Reported on 12/23/2015) 30 tablet 0  . cyclobenzaprine (FLEXERIL) 10 MG tablet Take 1 tablet (10 mg total) by mouth every 8 (eight) hours as needed for muscle spasms. (Patient not taking: Reported on 12/23/2015) 30 tablet 1   No current facility-administered medications on file prior to visit.     Review of Systems:  All pertinent positive/negative included in HPI, all other review of systems are negative  Objective:  Physical Exam BP 103/54 mmHg  Pulse 58  Resp 16  Ht 5\' 3"  (1.6 m)  Wt 162 lb (73.483 kg)  BMI 28.70 kg/m2  LMP 12/23/2015  Breastfeeding? No CONSTITUTIONAL: Well-developed, well-nourished female in no acute distress.  EYES: EOM intact ENT: Normocephalic CARDIOVASCULAR: Regular rate and rhythm with no adventitious sounds.  RESPIRATORY:  Normal rate. Clear to auscultation bilaterally.  ENDOCRINE: Normal thyroid.  MUSCULOSKELETAL: Normal ROM, strength equal bilaterally, no muscle spasm noted SKIN: Warm, dry without erythema  NEUROLOGICAL: Alert, oriented, CN II-XII grossly intact, Appropriate balance, No dysmetria, Sensation equal bilaterally, Romberg negative.   PSYCH: Normal behavior, mood   Assessment & Plan:  Assessment: 1. Migraine without aura and without status migrainosus, not intractable   Hormonal headache - new problem   Plan: Topamax titrate weekly as tolerated up to 75mg  qhs  Imitrex 50mg  for acute migraine Reglan for headache and/or nausea. Lifestyle discussed - regular sleep schedule, limit  caffeine, no smoking/marijuana Maintain contraception at this time as she has not sustained long enough to be certain of cause/effect. Follow-up in 3 months or sooner PRN  Bertram DenverKaren E Teague Clark, PA-C 12/23/2015 8:59 AM

## 2015-12-23 NOTE — Patient Instructions (Signed)
Analgesic Rebound Headaches An analgesic rebound headache is a headache that returns after pain medicine (analgesic) that was taken to treat the initial headache wears off. People who suffer from tension, migraine, or cluster headaches are at risk for developing rebound headaches. Any type of primary headache can return as a rebound headache if you regularly take analgesics more than three times a week. If the cycle of rebound headaches continues, they become chronic daily headaches.  CAUSES Analgesics frequently associated with this problem include common over-the-counter medicines like aspirin, ibuprofen, acetaminophen, sinus relief medicines, and other medicines that contain caffeine. Narcotic pain medicines are also a common cause of rebound headaches.  SIGNS AND SYMPTOMS The symptoms of rebound headaches are the same as the symptoms of your initial headache. Symptoms of specific types of headaches include: Tension headache  Pressure around the head.  Dull, aching head pain.  Pain felt over the front and sides of the head.  Tenderness in the muscles of the head, neck and shoulders. Migraine Headache  Pulsing or throbbing pain on one or both sides of the head.  Severe pain that interferes with daily activities.  Pain that is worsened by physical activity.  Nausea, vomiting, or both.  Pain with exposure to bright light, loud noises, or strong smells.  General sensitivity to bright light, loud noises, or strong smells.  Visual changes.  Numbness of one or both arms. Cluster Headaches  Severe pain that begins in or around one eye or temple.  Redness in the eye on the same side as the pain.  Droopy or swollen eyelid.  One-sided head pain.  Nausea.  Runny nose.  Sweaty, pale facial skin.  Restlessness. DIAGNOSIS  Analgesic rebound headaches are diagnosed by reviewing your medical history. This includes the nature of your initial headaches, as well as the type of pain  medicines you have been using to treat your headaches and how often you take them. TREATMENT Discontinuing frequent use of the analgesic medicine will typically reduce the frequency of the rebound episodes. This may initially worsen your headaches but eventually the pain should become more manageable, less frequent, and less severe.  Seeing a headache specialists may helpful. He or she may be able to help you manage your headaches and to make sure there is not another cause of the headaches. Alternative methods of stress relief such as acupuncture, counseling, biofeedback, and massage may also be helpful. Talk with your health care provider about which alternative treatments might be good for you. HOME CARE INSTRUCTIONS Stopping the regular use of pain medicine can be difficult. Follow your health care provider's instructions carefully. Keep all of your appointments. Avoid triggers that are known to cause your primary headaches. SEEK MEDICAL CARE IF: You continue to experience headaches after following your health care provider's recommended treatments. SEEK IMMEDIATE MEDICAL CARE IF:  You develop new headache pain.  You develop headache pain that is different than what you have experienced in the past.  You develop numbness or tingling in your arms or legs.  You develop changes in your speech or vision. MAKE SURE YOU:  Understand these instructions.  Will watch your child's condition.  Will get help right away if your child is not doing well or gets worse.   This information is not intended to replace advice given to you by your health care provider. Make sure you discuss any questions you have with your health care provider.   Document Released: 08/25/2003 Document Revised: 06/25/2014 Document Reviewed: 12/18/2012 Elsevier   Interactive Patient Education 2016 Elsevier Inc. Migraine Headache A migraine headache is an intense, throbbing pain on one or both sides of your head. A migraine  can last for 30 minutes to several hours. CAUSES  The exact cause of a migraine headache is not always known. However, a migraine may be caused when nerves in the brain become irritated and release chemicals that cause inflammation. This causes pain. Certain things may also trigger migraines, such as:  Alcohol.  Smoking.  Stress.  Menstruation.  Aged cheeses.  Foods or drinks that contain nitrates, glutamate, aspartame, or tyramine.  Lack of sleep.  Chocolate.  Caffeine.  Hunger.  Physical exertion.  Fatigue.  Medicines used to treat chest pain (nitroglycerine), birth control pills, estrogen, and some blood pressure medicines. SIGNS AND SYMPTOMS  Pain on one or both sides of your head.  Pulsating or throbbing pain.  Severe pain that prevents daily activities.  Pain that is aggravated by any physical activity.  Nausea, vomiting, or both.  Dizziness.  Pain with exposure to bright lights, loud noises, or activity.  General sensitivity to bright lights, loud noises, or smells. Before you get a migraine, you may get warning signs that a migraine is coming (aura). An aura may include:  Seeing flashing lights.  Seeing bright spots, halos, or zigzag lines.  Having tunnel vision or blurred vision.  Having feelings of numbness or tingling.  Having trouble talking.  Having muscle weakness. DIAGNOSIS  A migraine headache is often diagnosed based on:  Symptoms.  Physical exam.  A CT scan or MRI of your head. These imaging tests cannot diagnose migraines, but they can help rule out other causes of headaches. TREATMENT Medicines may be given for pain and nausea. Medicines can also be given to help prevent recurrent migraines.  HOME CARE INSTRUCTIONS  Only take over-the-counter or prescription medicines for pain or discomfort as directed by your health care provider. The use of long-term narcotics is not recommended.  Lie down in a dark, quiet room when you  have a migraine.  Keep a journal to find out what may trigger your migraine headaches. For example, write down:  What you eat and drink.  How much sleep you get.  Any change to your diet or medicines.  Limit alcohol consumption.  Quit smoking if you smoke.  Get 7-9 hours of sleep, or as recommended by your health care provider.  Limit stress.  Keep lights dim if bright lights bother you and make your migraines worse. SEEK IMMEDIATE MEDICAL CARE IF:   Your migraine becomes severe.  You have a fever.  You have a stiff neck.  You have vision loss.  You have muscular weakness or loss of muscle control.  You start losing your balance or have trouble walking.  You feel faint or pass out.  You have severe symptoms that are different from your first symptoms. MAKE SURE YOU:   Understand these instructions.  Will watch your condition.  Will get help right away if you are not doing well or get worse.   This information is not intended to replace advice given to you by your health care provider. Make sure you discuss any questions you have with your health care provider.   Document Released: 06/04/2005 Document Revised: 06/25/2014 Document Reviewed: 02/09/2013 Elsevier Interactive Patient Education 2016 Elsevier Inc.  

## 2016-01-10 ENCOUNTER — Encounter: Payer: Self-pay | Admitting: Obstetrics and Gynecology

## 2016-01-10 ENCOUNTER — Ambulatory Visit (INDEPENDENT_AMBULATORY_CARE_PROVIDER_SITE_OTHER): Payer: Medicaid Other | Admitting: Obstetrics and Gynecology

## 2016-01-10 VITALS — BP 131/83 | HR 87 | Ht 63.0 in | Wt 158.0 lb

## 2016-01-10 DIAGNOSIS — Z30011 Encounter for initial prescription of contraceptive pills: Secondary | ICD-10-CM | POA: Diagnosis not present

## 2016-01-10 DIAGNOSIS — Z3046 Encounter for surveillance of implantable subdermal contraceptive: Secondary | ICD-10-CM

## 2016-01-10 DIAGNOSIS — N760 Acute vaginitis: Secondary | ICD-10-CM | POA: Diagnosis not present

## 2016-01-10 DIAGNOSIS — N938 Other specified abnormal uterine and vaginal bleeding: Secondary | ICD-10-CM | POA: Diagnosis not present

## 2016-01-10 MED ORDER — NORGESTIMATE-ETH ESTRADIOL 0.25-35 MG-MCG PO TABS: 1.0000 | ORAL_TABLET | Freq: Every day | ORAL | 11 refills | Status: DC

## 2016-01-10 NOTE — Progress Notes (Signed)
20 yo G1 presenting today for renewal of birth control to help manage her DUB associated with Nexplanon and for evaluation of vaginitis. Patient reports good results with Sprintec in controlling her cycle. She states that after her last period she noticed a discharge with odor. She denies pruritis. She is without any other complaints  Past Medical History:  Diagnosis Date  . Bronchitis    Past Surgical History:  Procedure Laterality Date  . NO PAST SURGERIES     Family History  Problem Relation Age of Onset  . Hypertension Mother   . Diabetes Other    Social History  Substance Use Topics  . Smoking status: Never Smoker  . Smokeless tobacco: Never Used  . Alcohol use No   ROS See pertinent in HPI  Blood pressure 131/83, pulse 87, height 5\' 3"  (1.6 m), weight 158 lb (71.7 kg), last menstrual period 01/02/2016, not currently breastfeeding. GENERAL: Well-developed, well-nourished female in no acute distress.  ABDOMEN: Soft, nontender, nondistended. No organomegaly. PELVIC: Normal external female genitalia. Vagina is pink and rugated.  Normal discharge. Normal appearing cervix. Uterus is normal in size. No adnexal mass or tenderness. EXTREMITIES: No cyanosis, clubbing, or edema, 2+ distal pulses.  A/P 20 yo with DUB associated with Nexplanon and vaginits - Rx Sprintec provided - Wet prep collected - Patient will be contacted with any abnormal results - RTC prn

## 2016-01-10 NOTE — Progress Notes (Signed)
Pt is still c/o irregular bleeding associated with the Nexplanon, last menstrual cycle started on 01-02-16 and noticed a vaginal odor following that menstrual cycle.

## 2016-01-11 ENCOUNTER — Telehealth: Payer: Self-pay | Admitting: *Deleted

## 2016-01-11 LAB — WET PREP, GENITAL: TRICH WET PREP: NONE SEEN

## 2016-01-11 MED ORDER — FLUCONAZOLE 150 MG PO TABS: 150.0000 mg | ORAL_TABLET | Freq: Once | ORAL | 0 refills | Status: AC

## 2016-01-11 MED ORDER — METRONIDAZOLE 500 MG PO TABS: 500.0000 mg | ORAL_TABLET | Freq: Two times a day (BID) | ORAL | 0 refills | Status: DC

## 2016-01-11 NOTE — Addendum Note (Signed)
Addended by: Catalina Antigua on: 01/11/2016 11:27 AM   Modules accepted: Orders

## 2016-01-11 NOTE — Telephone Encounter (Signed)
Informed pt of wet prep result and treatment regimen.  Instructed pt on medication use.

## 2016-01-11 NOTE — Telephone Encounter (Signed)
-----   Message from Catalina Antigua, MD sent at 01/11/2016 11:27 AM EDT ----- Please inform patient of both BV and yeast infection. Both Rx has been e-prescribed  Animator

## 2016-03-20 ENCOUNTER — Encounter: Payer: Self-pay | Admitting: Obstetrics & Gynecology

## 2016-03-20 ENCOUNTER — Ambulatory Visit (INDEPENDENT_AMBULATORY_CARE_PROVIDER_SITE_OTHER): Payer: Medicaid Other | Admitting: Obstetrics & Gynecology

## 2016-03-20 DIAGNOSIS — Z975 Presence of (intrauterine) contraceptive device: Principal | ICD-10-CM

## 2016-03-20 DIAGNOSIS — Z978 Presence of other specified devices: Secondary | ICD-10-CM | POA: Diagnosis not present

## 2016-03-20 DIAGNOSIS — Z7189 Other specified counseling: Secondary | ICD-10-CM | POA: Diagnosis not present

## 2016-03-20 DIAGNOSIS — N921 Excessive and frequent menstruation with irregular cycle: Secondary | ICD-10-CM

## 2016-03-20 DIAGNOSIS — Z7185 Encounter for immunization safety counseling: Secondary | ICD-10-CM

## 2016-03-20 NOTE — Progress Notes (Signed)
   GYNECOLOGY VISIT NOTE  History:  20 y.o. G1P1001 here today for complaint of breakthrough bleeding (BTB) on Nexplanon. Nexplanon has been in place since 06/2015; has been on OCPs to help with BTB for two months. Felt BTB improved in last month, was initially missing pills. Does not desire Nexplanon removal.   She denies any current abnormal vaginal discharge, bleeding, pelvic pain or other concerns.   Past Medical History:  Diagnosis Date  . Bronchitis     Past Surgical History:  Procedure Laterality Date  . NO PAST SURGERIES      The following portions of the patient's history were reviewed and updated as appropriate: allergies, current medications, past family history, past medical history, past social history, past surgical history and problem list.   Health Maintenance:  Has not received any HPV vaccine.  Review of Systems:  Pertinent items noted in HPI and remainder of comprehensive ROS otherwise negative.  Objective:  Physical Exam AFVSS CONSTITUTIONAL: Well-developed, well-nourished female in no acute distress.  HENT:  Normocephalic, atraumatic. External right and left ear normal. Oropharynx is clear and moist EYES: Conjunctivae and EOM are normal. Pupils are equal, round, and reactive to light. No scleral icterus.  NECK: Normal range of motion, supple, no masses SKIN: Skin is warm and dry. No rash noted. Not diaphoretic. No erythema. No pallor. NEUROLOGIC: Alert and oriented to person, place, and time. Normal reflexes, muscle tone coordination. No cranial nerve deficit noted. PSYCHIATRIC: Normal mood and affect. Normal behavior. Normal judgment and thought content. CARDIOVASCULAR: Normal heart rate noted RESPIRATORY: Effort and breath sounds normal, no problems with respiration noted ABDOMEN: Soft, no distention noted.   PELVIC: Deferred MUSCULOSKELETAL: Normal range of motion. No edema noted.   Assessment & Plan:  Breakthrough bleeding on Nexplanon Advised to  continue taking pills; advised to take at same time everyday. Told to use alarms, apps to help her remember. If BTB worsens, will consider Depo Provera vs progestin IUD instead. Routine preventative health maintenance measures emphasized, counseled about HPV vaccine, she declined this today. Please refer to After Visit Summary for other counseling recommendations.   Return if symptoms worsen or fail to improve.   Total face-to-face time with patient: 15 minutes. Over 50% of encounter was spent on counseling and coordination of care.   Jaynie CollinsUGONNA  Tarissa Kerin, MD, FACOG Attending Obstetrician & Gynecologist, [facility], [facility]

## 2016-03-20 NOTE — Patient Instructions (Signed)
Thank you for enrolling in MyChart. Please follow the instructions below to securely access your online medical record. MyChart allows you to send messages to your doctor, view your test results, manage appointments, and more.   How Do I Sign Up? 1. In your Internet browser, go to [employer] and enter [email or web address]. 2. Click on the Sign Up Now link in the Sign In box. You will see the New Member Sign Up page. 3. Enter your MyChart Access Code exactly as it appears below. You will not need to use this code after you've completed the sign-up process. If you do not sign up before the expiration date, you must request a new code.  MyChart Access Code: [email or web address] Expires: 05/19/2016  3:58 PM  4. Enter your Social Security Number ([email or web address]) and Date of Birth (mm/dd/yyyy) as indicated and click Submit. You will be taken to the next sign-up page. 5. Create a MyChart ID. This will be your MyChart login ID and cannot be changed, so think of one that is secure and easy to remember. 6. Create a MyChart password. You can change your password at any time. 7. Enter your Password Reset Question and Answer. This can be used at a later time if you forget your password.  8. Enter your e-mail address. You will receive e-mail notification when new information is available in MyChart. 9. Click Sign Up. You can now view your medical record.   Additional Information Remember, MyChart is NOT to be used for urgent needs. For medical emergencies, dial 911.  HPV (Human Papillomavirus) Vaccine--Gardasil-9:  1. Why get vaccinated? Gardasil-9 prevents human papillomavirus (HPV) types that cause many cancers, including:  cervical cancer in females,  vaginal and vulvar cancers in females,  anal cancer in females and males,  throat cancer in females and males, and  penile cancer in males. In addition, Gardasil-9 prevents HPV types that cause genital warts in both females and males. In  the [place] about 12,000 women get cervical cancer every year, and about 4,000 women die from it. Kelli Soto can prevent most of these cases of cervical cancer. Vaccination is not a substitute for cervical cancer screening. This vaccine does not protect against all HPV types that can cause cervical cancer. Women should still get regular Pap tests. HPV infection usually comes from sexual contact, and most people will become infected at some point in their life. About 14 million Americans, including teens, get infected every year. Most infections will go away and not cause serious problems. But thousands of women and men get cancer and diseases from HPV. 2. HPV vaccine Kelli Soto is an FDA-approved HPV vaccine. It is recommended for both males and females. It is routinely given at 20 or 20 years of age, but it may be given beginning at age 20 years through age 47 years. Three doses of Gardasil-9 are recommended with the second dose given 1-2 months after the first dose and the third dose given 6 months after the first dose. 3. Some people should not get this vaccine  Anyone who has had a severe, life-threatening allergic reaction to a dose of HPV vaccine should not get another dose.  Anyone who has a severe (life threatening) allergy to any component of HPV vaccine should not get the vaccine. Tell your doctor if you have any severe allergies that you know of, including a severe allergy to yeast.  HPV vaccine is not recommended for pregnant women. If you learn that you were pregnant when you were vaccinated,  there is no reason to expect any problems for you or your baby. Any woman who learns she was pregnant when she got Gardasil-9 vaccine is encouraged to contact the manufacturer's registry for HPV vaccination during pregnancy at 301-888-4273. Women who are breastfeeding may be vaccinated.  If you have a mild illness, such as a cold, you can probably get the vaccine today. If you are moderately or  severely ill, you should probably wait until you recover. Your doctor can advise you. 4. Risks of a vaccine reaction With any medicine, including vaccines, there is a chance of side effects. These are usually mild and go away on their own, but serious reactions are also possible. Most people who get HPV vaccine do not have any serious problems with it. Mild or moderate problems following Gardasil-9:  Reactions in the arm where the shot was given:  Soreness (about 9 people in 10)  Redness or swelling (about 1 person in 3)  Fever:  Mild (100F) (about 1 person in 10)  Moderate (102F) (about 1 person in 65)  Other problems:  Headache (about 1 person in 3) Problems that could happen after any injected vaccine:  People sometimes faint after a medical procedure, including vaccination. Sitting or lying down for about 15 minutes can help prevent fainting, and injuries caused by a fall. Tell your doctor if you feel dizzy, or have vision changes or ringing in the ears.  Some people get severe pain in the shoulder and have difficulty moving the arm where a shot was given. This happens very rarely.  Any medication can cause a severe allergic reaction. Such reactions from a vaccine are very rare, estimated at about 1 in a million doses, and would happen within a few minutes to a few hours after the vaccination. As with any medicine, there is a very remote chance of a vaccine causing a serious injury or death. The safety of vaccines is always being monitored. For more information, visit: http://floyd.org/. 5. What if there is a serious reaction? What should I look for? Look for anything that concerns you, such as signs of a severe allergic reaction, very high fever, or unusual behavior. Signs of a severe allergic reaction can include hives, swelling of the face and throat, difficulty breathing, a fast heartbeat, dizziness, and weakness. These would usually start a few minutes to a few  hours after the vaccination. What should I do? If you think it is a severe allergic reaction or other emergency that can't wait, call 9-1-1 or get to the nearest hospital. Otherwise, call your doctor. Afterward, the reaction should be reported to the "Vaccine Adverse Event Reporting System" (VAERS). Your doctor might file this report, or you can do it yourself through the VAERS web site at www.vaers.LAgents.no, or by calling 1-409 108 0599. VAERS does not give medical advice. 6. The National Vaccine Injury Compensation Program The [employer] Vaccine Injury Compensation Program (VICP) is a federal program that was created to compensate people who may have been injured by certain vaccines. Persons who believe they may have been injured by a vaccine can learn about the program and about filing a claim by calling 1-(404) 858-9475 or visiting the VICP website at SpiritualWord.at. There is a time limit to file a claim for compensation. 7. How can I learn more?  Ask your health care provider. He or she can give you the vaccine package insert or suggest other sources of information.  Call your local or state health department.  Contact the Centers for Disease  Control and Prevention (CDC):  Call (408)813-88201-(515)826-6082 (1-800-CDC-INFO) or  Visit CDC's website at RunningConvention.dewww.cdc.gov/hpv Vaccine Information Statement HPV Vaccine Kelli Soto(Gardasil-9) 09/16/14   This information is not intended to replace advice given to you by your health care provider. Make sure you discuss any questions you have with your health care provider.   Document Released: 12/30/2013 Document Revised: 10/19/2014 Document Reviewed: 12/30/2013 Elsevier Interactive Patient Education [employer]2016 Elsevier Inc.

## 2016-05-08 ENCOUNTER — Encounter: Payer: Medicaid Other | Admitting: Physician Assistant

## 2016-06-22 ENCOUNTER — Encounter: Payer: Medicaid Other | Admitting: Physician Assistant

## 2016-06-28 ENCOUNTER — Ambulatory Visit: Payer: Medicaid Other | Admitting: Obstetrics & Gynecology

## 2016-09-11 ENCOUNTER — Encounter: Payer: Self-pay | Admitting: Obstetrics and Gynecology

## 2016-09-11 ENCOUNTER — Ambulatory Visit (INDEPENDENT_AMBULATORY_CARE_PROVIDER_SITE_OTHER): Payer: Medicaid Other | Admitting: Obstetrics and Gynecology

## 2016-09-11 VITALS — BP 95/59 | HR 71 | Wt 149.0 lb

## 2016-09-11 DIAGNOSIS — N761 Subacute and chronic vaginitis: Secondary | ICD-10-CM | POA: Diagnosis not present

## 2016-09-11 MED ORDER — NORGESTIMATE-ETH ESTRADIOL 0.25-35 MG-MCG PO TABS: 1.0000 | ORAL_TABLET | Freq: Every day | ORAL | 11 refills | Status: DC

## 2016-09-11 NOTE — Progress Notes (Signed)
21 yo G1P1 presenting today for the evaluation of vaginitis. Patient self treated herself with monistat a few weeks ago due to the presence of a vaginal discharge. She reports a heavy periods since treatment with passage of clots. She reports taking OCP for breakthrough bleeding as needed. She is sexually active. She desires to keep Nexplanon  Past Medical History:  Diagnosis Date  . Bronchitis    Past Surgical History:  Procedure Laterality Date  . NO PAST SURGERIES     Family History  Problem Relation Age of Onset  . Hypertension Mother   . Diabetes Other    Social History  Substance Use Topics  . Smoking status: Never Smoker  . Smokeless tobacco: Never Used  . Alcohol use No   ROS See pertinent in HPI  Blood pressure (!) 95/59, pulse 71, weight 149 lb (67.6 kg), last menstrual period 09/09/2016, not currently breastfeeding. GENERAL: Well-developed, well-nourished female in no acute distress.  ABDOMEN: Soft, nontender, nondistended.  PELVIC: Normal external female genitalia. Vagina is pink and rugated.  Normal discharge. Normal appearing cervix. Uterus is normal in size. No adnexal mass or tenderness. EXTREMITIES: No cyanosis, clubbing, or edema, 2+ distal pulses. Nexplanon palpated in left arm  A/P 21 yo with vaginitis - vaginal cultures collected - STD screen ordered - Discussed the daily use of OCP for management of vaginal bleeding associated with Nexplanon - Discussed other contraception options such as depo-provera and IUD. Patient plans to continue with Nexplanon and COC - Patient will be contacted with results - RTC prn

## 2016-09-13 LAB — CERVICOVAGINAL ANCILLARY ONLY
BACTERIAL VAGINITIS: NEGATIVE
Candida vaginitis: NEGATIVE
Chlamydia: NEGATIVE
NEISSERIA GONORRHEA: NEGATIVE
TRICH (WINDOWPATH): NEGATIVE

## 2016-09-14 LAB — HEPATITIS C ANTIBODY: Hep C Virus Ab: <0.1 s/co ratio (ref 0.0–0.9)

## 2016-09-14 LAB — RPR: RPR: NONREACTIVE

## 2016-09-14 LAB — HIV ANTIBODY (ROUTINE TESTING W REFLEX): HIV SCREEN 4TH GENERATION: NONREACTIVE

## 2016-09-14 LAB — HEPATITIS B SURFACE ANTIGEN: HEP B S AG: NEGATIVE

## 2016-10-02 ENCOUNTER — Encounter: Payer: Self-pay | Admitting: Obstetrics and Gynecology

## 2016-10-02 ENCOUNTER — Ambulatory Visit (INDEPENDENT_AMBULATORY_CARE_PROVIDER_SITE_OTHER): Payer: Medicaid Other | Admitting: Obstetrics and Gynecology

## 2016-10-02 VITALS — BP 100/65 | HR 63 | Ht 64.0 in | Wt 150.0 lb

## 2016-10-02 DIAGNOSIS — Z308 Encounter for other contraceptive management: Secondary | ICD-10-CM

## 2016-10-02 DIAGNOSIS — Z3046 Encounter for surveillance of implantable subdermal contraceptive: Secondary | ICD-10-CM | POA: Diagnosis not present

## 2016-10-02 NOTE — Progress Notes (Signed)
21 yo G1P1 here for Nexplanon removal secondary to persistent DUB. Patient has had the Nexplanon in place since 06/2015 and supplemental management with OCP. She desires removal of implant and plans to continue with OCP. She will return for depo-provera later  Removal Patient given informed consent for removal of her Nexplanon, time out was performed.  Signed copy in the chart.  Appropriate time out taken. Implanon site identified.  Area prepped in usual sterile fashon. One cc of 1% lidocaine was used to anesthetize the area at the distal end of the implant. A small stab incision was made right beside the implant on the distal portion.  The nexplanon rod was grasped using hemostats and removed without difficulty.  There was less than 3 cc blood loss. There were no complications.  A small amount of antibiotic ointment and steri-strips were applied over the small incision.  A pressure bandage was applied to reduce any bruising.  The patient tolerated the procedure well and was given post procedure instructions.

## 2016-11-23 ENCOUNTER — Other Ambulatory Visit (HOSPITAL_COMMUNITY)
Admission: RE | Admit: 2016-11-23 | Discharge: 2016-11-23 | Disposition: A | Discharge status: Home / Self Care | Payer: Medicaid Other | Source: Ambulatory Visit | Attending: Obstetrics and Gynecology | Admitting: Obstetrics and Gynecology

## 2016-11-23 ENCOUNTER — Other Ambulatory Visit: Payer: Medicaid Other

## 2016-11-23 ENCOUNTER — Other Ambulatory Visit: Payer: Medicaid Other | Admitting: *Deleted

## 2016-11-23 DIAGNOSIS — N898 Other specified noninflammatory disorders of vagina: Secondary | ICD-10-CM

## 2016-11-23 DIAGNOSIS — L298 Other pruritus: Secondary | ICD-10-CM | POA: Diagnosis not present

## 2016-11-23 MED ORDER — FLUCONAZOLE 150 MG PO TABS: 150.0000 mg | ORAL_TABLET | Freq: Once | ORAL | 0 refills | Status: AC

## 2016-11-23 NOTE — Progress Notes (Signed)
Pt is here today c/o vaginal itching and irritation after switching her soap brand.  States she noticed a thick white discharge and itching after using the soap.  Tried OTC Monistat with little relief.  Self swab performed and sent to lab.  Diflucan sent to pharmacy and instructed on medication use.  Will notify pt of results when available.

## 2016-11-23 NOTE — Addendum Note (Signed)
Addended by: Marda StalkerLASSITER, Kirstyn Lean on: 11/23/2016 10:18 AM   Modules accepted: Level of Service

## 2016-11-26 LAB — CERVICOVAGINAL ANCILLARY ONLY
BACTERIAL VAGINITIS: NEGATIVE
CANDIDA VAGINITIS: NEGATIVE
CHLAMYDIA, DNA PROBE: NEGATIVE
NEISSERIA GONORRHEA: NEGATIVE
Trichomonas: NEGATIVE

## 2017-02-14 ENCOUNTER — Other Ambulatory Visit (HOSPITAL_COMMUNITY)
Admission: RE | Admit: 2017-02-14 | Discharge: 2017-02-14 | Disposition: A | Discharge status: Home / Self Care | Payer: Medicaid Other | Source: Ambulatory Visit | Attending: Obstetrics and Gynecology | Admitting: Obstetrics and Gynecology

## 2017-02-14 ENCOUNTER — Other Ambulatory Visit (INDEPENDENT_AMBULATORY_CARE_PROVIDER_SITE_OTHER): Payer: Medicaid Other | Admitting: *Deleted

## 2017-02-14 DIAGNOSIS — N898 Other specified noninflammatory disorders of vagina: Secondary | ICD-10-CM

## 2017-02-14 NOTE — Progress Notes (Addendum)
SUBJECTIVE:  21 y.o. female complains of foul vaginal discharge for 2 day(s). Denies abnormal vaginal bleeding or significant pelvic pain or fever. No UTI symptoms. Denies history of known exposure to STD.  No LMP recorded.  OBJECTIVE:  She appears well, afebrile.  ASSESSMENT:  Vaginal Discharge  Vaginal Odor   PLAN:  GC, chlamydia, trichomonas, BVAG, CVAG probe sent to lab. Treatment: To be determined once lab results are received ROV prn if symptoms persist or worsen.   Agree with above  Cornelia Copaharlie Pickens, Jr MD Attending Center for [facility](Faculty Practice)

## 2017-02-15 LAB — CERVICOVAGINAL ANCILLARY ONLY
BACTERIAL VAGINITIS: POSITIVE — AB
CANDIDA VAGINITIS: NEGATIVE
CHLAMYDIA, DNA PROBE: POSITIVE — AB
NEISSERIA GONORRHEA: NEGATIVE
TRICH (WINDOWPATH): NEGATIVE

## 2017-02-15 MED ORDER — METRONIDAZOLE 500 MG PO TABS: 500.0000 mg | ORAL_TABLET | Freq: Two times a day (BID) | ORAL | 0 refills | Status: DC

## 2017-02-15 NOTE — Addendum Note (Signed)
Addended by: Judd GaudierLEVENS, SHANNON M on: 02/15/2017 11:26 AM   Modules accepted: Orders

## 2017-02-19 ENCOUNTER — Other Ambulatory Visit: Payer: Self-pay

## 2017-02-19 ENCOUNTER — Encounter: Payer: Self-pay | Admitting: Obstetrics and Gynecology

## 2017-02-19 ENCOUNTER — Telehealth: Payer: Self-pay | Admitting: *Deleted

## 2017-02-19 DIAGNOSIS — A749 Chlamydial infection, unspecified: Secondary | ICD-10-CM | POA: Insufficient documentation

## 2017-02-19 HISTORY — DX: Chlamydial infection, unspecified: A74.9

## 2017-02-19 MED ORDER — AZITHROMYCIN 500 MG PO TABS: 500.0000 mg | ORAL_TABLET | Freq: Every day | ORAL | 0 refills | Status: DC

## 2017-02-19 NOTE — Telephone Encounter (Signed)
-----   Message from [place] Bingharlie Pickens, MD sent at 02/19/2017  8:24 AM EDT ----- Please treat her for the chlamydia and BV and have her come back in 3 months for a test of cure. thanks

## 2017-02-19 NOTE — Telephone Encounter (Signed)
Zithromax called to pharmacy for treatment.

## 2017-02-19 NOTE — Telephone Encounter (Signed)
Patient aware of results and treatment plan.

## 2017-02-21 MED ORDER — AZITHROMYCIN 500 MG PO TABS: 1000.0000 mg | ORAL_TABLET | Freq: Once | ORAL | 1 refills | Status: AC

## 2017-02-21 NOTE — Addendum Note (Signed)
Addended by: Arne ClevelandHUTCHINSON, Jaxxson Cavanah J on: 02/21/2017 04:44 PM   Modules accepted: Orders

## 2017-04-10 ENCOUNTER — Other Ambulatory Visit: Payer: Self-pay

## 2017-04-10 MED ORDER — FLUCONAZOLE 150 MG PO TABS: 150.0000 mg | ORAL_TABLET | Freq: Once | ORAL | 0 refills | Status: AC

## 2017-04-10 NOTE — Telephone Encounter (Signed)
Patient is requesting a refill on diflucan.  

## 2017-05-16 ENCOUNTER — Ambulatory Visit: Payer: Self-pay

## 2017-05-23 ENCOUNTER — Ambulatory Visit: Payer: Self-pay

## 2017-05-24 ENCOUNTER — Ambulatory Visit: Payer: Self-pay

## 2017-06-18 DIAGNOSIS — A549 Gonococcal infection, unspecified: Secondary | ICD-10-CM

## 2017-06-18 HISTORY — DX: Gonococcal infection, unspecified: A54.9

## 2017-07-01 ENCOUNTER — Other Ambulatory Visit (HOSPITAL_COMMUNITY)
Admission: RE | Admit: 2017-07-01 | Discharge: 2017-07-01 | Disposition: A | Discharge status: Home / Self Care | Payer: Medicaid Other | Source: Ambulatory Visit | Attending: Obstetrics & Gynecology | Admitting: Obstetrics & Gynecology

## 2017-07-01 ENCOUNTER — Other Ambulatory Visit: Payer: Medicaid Other

## 2017-07-01 VITALS — BP 105/72 | HR 57

## 2017-07-01 DIAGNOSIS — N898 Other specified noninflammatory disorders of vagina: Secondary | ICD-10-CM | POA: Diagnosis present

## 2017-07-01 NOTE — Progress Notes (Signed)
Patient presented to the office today for self swab. Patient complains of vaginal discharge for a couple of days. Patient instructed on how to self swab. Swab has been collected/labeled and sent to the pharmacy. Patient has been advised we will call her back once results come in.

## 2017-07-02 LAB — CERVICOVAGINAL ANCILLARY ONLY
BACTERIAL VAGINITIS: POSITIVE — AB
CANDIDA VAGINITIS: NEGATIVE
CHLAMYDIA, DNA PROBE: NEGATIVE
Neisseria Gonorrhea: NEGATIVE
TRICH (WINDOWPATH): NEGATIVE

## 2017-07-03 ENCOUNTER — Telehealth: Payer: Self-pay | Admitting: *Deleted

## 2017-07-03 MED ORDER — METRONIDAZOLE 0.75 % VA GEL: 1.0000 | Freq: Every day | VAGINAL | 1 refills | Status: DC

## 2017-07-03 NOTE — Telephone Encounter (Signed)
-----   Message from Allie BossierMyra C Dove, MD sent at 07/03/2017  2:42 PM EST ----- She will need treatment for BV again, flagyl Thanks

## 2017-07-03 NOTE — Telephone Encounter (Signed)
Sent Metrogel per pt request

## 2017-09-16 ENCOUNTER — Telehealth: Payer: Self-pay | Admitting: *Deleted

## 2017-09-16 MED ORDER — METRONIDAZOLE 0.75 % VA GEL: 1.0000 | Freq: Every day | VAGINAL | 1 refills | Status: DC

## 2017-09-16 NOTE — Telephone Encounter (Signed)
Pt called in stating she was supposed to have Metrogel sent in 2 weeks ago for BV but never got a return call. Pt states she has vaginal odor after having to change soaps and using tampons during cycle. Whenever this happens she gets BV. Sent in metrogel as requested to pt preferred pharmacy

## 2017-09-27 ENCOUNTER — Other Ambulatory Visit: Payer: Medicaid Other

## 2017-09-27 ENCOUNTER — Other Ambulatory Visit (HOSPITAL_COMMUNITY)
Admission: RE | Admit: 2017-09-27 | Discharge: 2017-09-27 | Disposition: A | Discharge status: Home / Self Care | Payer: Medicaid Other | Source: Ambulatory Visit | Attending: Obstetrics and Gynecology | Admitting: Obstetrics and Gynecology

## 2017-09-27 DIAGNOSIS — N898 Other specified noninflammatory disorders of vagina: Secondary | ICD-10-CM

## 2017-09-27 NOTE — Progress Notes (Signed)
SUBJECTIVE:  22 y.o. female complains of vag discharge for a couple of days.  Denies abnormal vaginal bleeding or significant pelvic pain or fever. No UTI symptoms. Denies history of known exposure to STD.  No LMP recorded.  OBJECTIVE:  She appears well, afebrile. Urine dipstick: not check.  ASSESSMENT:  Vaginal Discharge  Vaginal Odor   PLAN:  GC, chlamydia, trichomonas, BVAG, CVAG probe sent to lab. Treatment: To be determined once lab results are received ROV prn if symptoms persist or worsen.

## 2017-09-30 LAB — CERVICOVAGINAL ANCILLARY ONLY
Bacterial vaginitis: NEGATIVE
CANDIDA VAGINITIS: NEGATIVE
CHLAMYDIA, DNA PROBE: NEGATIVE
NEISSERIA GONORRHEA: NEGATIVE
Trichomonas: NEGATIVE

## 2017-11-18 ENCOUNTER — Other Ambulatory Visit: Payer: Medicaid Other

## 2017-11-18 ENCOUNTER — Other Ambulatory Visit (HOSPITAL_COMMUNITY)
Admission: RE | Admit: 2017-11-18 | Discharge: 2017-11-18 | Disposition: A | Discharge status: Home / Self Care | Payer: Medicaid Other | Source: Ambulatory Visit | Attending: Obstetrics & Gynecology | Admitting: Obstetrics & Gynecology

## 2017-11-18 VITALS — BP 95/67 | HR 72

## 2017-11-18 DIAGNOSIS — A5402 Gonococcal vulvovaginitis, unspecified: Secondary | ICD-10-CM | POA: Insufficient documentation

## 2017-11-18 DIAGNOSIS — B9689 Other specified bacterial agents as the cause of diseases classified elsewhere: Secondary | ICD-10-CM | POA: Diagnosis not present

## 2017-11-18 DIAGNOSIS — A5602 Chlamydial vulvovaginitis: Secondary | ICD-10-CM | POA: Insufficient documentation

## 2017-11-18 DIAGNOSIS — N898 Other specified noninflammatory disorders of vagina: Secondary | ICD-10-CM

## 2017-11-18 NOTE — Progress Notes (Signed)
SUBJECTIVE:  22 y.o. female complains of vaginal discharge for a couple of days. Denies abnormal vaginal bleeding or significant pelvic pain or fever. No UTI symptoms. Denies history of known exposure to STD.  No LMP recorded.  OBJECTIVE:  She appears well, afebrile. Urine dipstick: not completed today  ASSESSMENT:  Vaginal Discharge small ampunt Vaginal Odor yes   PLAN:  GC, chlamydia, trichomonas, BVAG, CVAG probe sent to lab. Treatment: To be determined once lab results are received ROV prn if symptoms persist or worsen.

## 2017-11-19 LAB — CERVICOVAGINAL ANCILLARY ONLY
Bacterial vaginitis: NEGATIVE
CHLAMYDIA, DNA PROBE: POSITIVE — AB
Candida vaginitis: NEGATIVE
NEISSERIA GONORRHEA: POSITIVE — AB
Trichomonas: NEGATIVE

## 2017-11-20 ENCOUNTER — Ambulatory Visit (INDEPENDENT_AMBULATORY_CARE_PROVIDER_SITE_OTHER): Payer: Medicaid Other

## 2017-11-20 ENCOUNTER — Other Ambulatory Visit: Payer: Self-pay

## 2017-11-20 VITALS — BP 109/75 | HR 72

## 2017-11-20 DIAGNOSIS — Z202 Contact with and (suspected) exposure to infections with a predominantly sexual mode of transmission: Secondary | ICD-10-CM | POA: Diagnosis not present

## 2017-11-20 MED ORDER — DOXYCYCLINE HYCLATE 100 MG PO CAPS
100.0000 mg | ORAL_CAPSULE | Freq: Two times a day (BID) | ORAL | 0 refills | Status: DC
Start: 2017-11-20 — End: 2018-01-01

## 2017-11-20 MED ORDER — CEFTRIAXONE SODIUM 250 MG IJ SOLR
250.0000 mg | Freq: Once | INTRAMUSCULAR | Status: AC
  Administered 2017-11-20: 250 mg via INTRAMUSCULAR

## 2017-11-20 NOTE — Telephone Encounter (Signed)
Call patient to inform her of positive GC/ CH infection. Inform patient she and partner will need to be treated. Medications called into the pharmacy and patient will come here to get her rocephin injection.

## 2017-11-20 NOTE — Progress Notes (Signed)
I have reviewed the chart and agree with nursing staff's documentation of this patient's encounter.  Catalina AntiguaPeggy Kehinde Totzke, MD 11/20/2017 11:05 AM

## 2017-11-20 NOTE — Progress Notes (Signed)
I have reviewed this chart and agree with the RN/CMA assessment and management.    Cyani Kallstrom C Irem Stoneham, MD, FACOG Attending Physician, Faculty Practice [facility]  

## 2017-11-20 NOTE — Progress Notes (Signed)
Patient tested positive for positive for chlamydia and gonorrhea. Per Dr.Dove called in doxycycline and give ceftriaxone 250 mg IM given in left gluteal area. Patient tolerated well and she has been informed abstain from intercourse for 10 days and  to make sure her partner has been treated. Patient verbalized understanding at this time.

## 2018-01-01 ENCOUNTER — Other Ambulatory Visit (INDEPENDENT_AMBULATORY_CARE_PROVIDER_SITE_OTHER): Payer: Medicaid Other

## 2018-01-01 ENCOUNTER — Other Ambulatory Visit (HOSPITAL_COMMUNITY)
Admission: RE | Admit: 2018-01-01 | Discharge: 2018-01-01 | Disposition: A | Discharge status: Home / Self Care | Payer: Medicaid Other | Source: Ambulatory Visit | Attending: Family Medicine | Admitting: Family Medicine

## 2018-01-01 VITALS — BP 105/62 | HR 55 | Resp 16 | Ht 64.0 in | Wt 126.8 lb

## 2018-01-01 DIAGNOSIS — N898 Other specified noninflammatory disorders of vagina: Secondary | ICD-10-CM | POA: Diagnosis present

## 2018-01-01 LAB — POCT URINALYSIS DIP (MANUAL ENTRY)
Bilirubin, UA: NEGATIVE
Blood, UA: NEGATIVE
GLUCOSE UA: NEGATIVE mg/dL
Ketones, POC UA: NEGATIVE mg/dL
Leukocytes, UA: NEGATIVE
Nitrite, UA: NEGATIVE
Protein Ur, POC: NEGATIVE mg/dL
Spec Grav, UA: 1.02 (ref 1.010–1.025)
UROBILINOGEN UA: 0.2 U/dL
pH, UA: 7.5 (ref 5.0–8.0)

## 2018-01-01 NOTE — Progress Notes (Signed)
..  SUBJECTIVE:  22 y.o. female complains of white vaginal discharge for 2 day(s). Patient reports the discharge does have an odor. Denies abnormal vaginal bleeding or significant pelvic pain or fever. No UTI symptoms. Patient reports she does have a history of STIs as well. Patient reports she only has one sexual partner.  Patient reports LMP as 12/05/2017 approximately. Patient reports she presently not on any contraception.  Pharmacy information verified.  OBJECTIVE:  She appears well, afebrile. Urine dipstick: not done.  ASSESSMENT:  Vaginal Discharge -white Vaginal Odor -yes   PLAN:  GC, chlamydia, trichomonas, BVAG, CVAG probe sent to lab. Treatment: To be determined once lab results are received ROV prn if symptoms persist or worsen.

## 2018-01-01 NOTE — Progress Notes (Signed)
Attestation of Attending Supervision of clinical support staff: I agree with the care provided to this patient and was available for any consultation.  I have reviewed the CMA's note and chart, and I agree with the management and plan.  [name] [name], MD, MPH, ABFM Attending Physician Faculty Practice- [facility]  

## 2018-01-02 LAB — CERVICOVAGINAL ANCILLARY ONLY
Bacterial vaginitis: POSITIVE — AB
CANDIDA VAGINITIS: NEGATIVE
Chlamydia: NEGATIVE
NEISSERIA GONORRHEA: NEGATIVE
TRICH (WINDOWPATH): NEGATIVE

## 2018-01-06 ENCOUNTER — Telehealth: Payer: Self-pay | Admitting: Family Medicine

## 2018-01-06 DIAGNOSIS — B9689 Other specified bacterial agents as the cause of diseases classified elsewhere: Secondary | ICD-10-CM

## 2018-01-06 DIAGNOSIS — N760 Acute vaginitis: Principal | ICD-10-CM

## 2018-01-06 MED ORDER — METRONIDAZOLE 0.75 % VA GEL: VAGINAL | 0 refills | Status: DC

## 2018-01-06 NOTE — Telephone Encounter (Signed)
Called patient - advised of lab results; per patient request Rx Metrogel 1 applicator vaginally every night for 5 nights electronically [facility] to [facility] [address] - per patient request.   Patient was advised her insurance may not pay for Rx but patient requested to send anyway.  Patient stated she understood lab results and Rx  - had no other questions at this time.

## 2018-01-06 NOTE — Telephone Encounter (Signed)
Pt called for lab results from last week. Please call pt with results and advise.

## 2018-01-09 ENCOUNTER — Ambulatory Visit: Payer: Medicaid Other | Admitting: Family Medicine

## 2018-01-21 ENCOUNTER — Encounter: Payer: Self-pay | Admitting: Family Medicine

## 2018-01-21 ENCOUNTER — Ambulatory Visit: Payer: Medicaid Other | Admitting: Family Medicine

## 2018-01-21 NOTE — Progress Notes (Signed)
Patient did not keep appointment today. She may call to reschedule.  

## 2018-05-13 ENCOUNTER — Telehealth: Payer: Self-pay

## 2018-05-13 NOTE — Telephone Encounter (Signed)
Received called from patient reporting she thinks she has BV and yeast and is requesting medication be called in. She reports having a small amount of discharge and some odor. Patient is not able to come in for a self swab due to work schedule. I have advised patient I will call her in some flagyl and diflucan for patient.  If her symptoms do not clear up she should follow up with the office for an appointment.

## 2018-05-13 NOTE — Telephone Encounter (Signed)
-----   Message from Rozann LeschesHeather B Walker, NT sent at 05/13/2018  2:16 PM EST ----- Regarding: patient is requesting med for BV  [facility] [address]

## 2018-05-21 ENCOUNTER — Other Ambulatory Visit: Payer: Medicaid Other

## 2018-05-21 ENCOUNTER — Other Ambulatory Visit (HOSPITAL_COMMUNITY)
Admission: RE | Admit: 2018-05-21 | Discharge: 2018-05-21 | Disposition: A | Discharge status: Home / Self Care | Payer: Medicaid Other | Source: Ambulatory Visit | Attending: Family Medicine | Admitting: Family Medicine

## 2018-05-21 VITALS — BP 98/61 | HR 72

## 2018-05-21 DIAGNOSIS — N898 Other specified noninflammatory disorders of vagina: Secondary | ICD-10-CM

## 2018-05-21 NOTE — Progress Notes (Signed)
SUBJECTIVE:  22 y.o. female complains of vaginal  discharge for a couple of days. Denies abnormal vaginal bleeding or significant pelvic pain or fever. No UTI symptoms. Denies history of known exposure to STD.  No LMP recorded.  OBJECTIVE:  She appears well, afebrile. Urine dipstick: not done   ASSESSMENT:  Vaginal Discharge small amount  Vaginal Odor small amount    PLAN:  GC, chlamydia, trichomonas, BVAG, CVAG probe sent to lab. Treatment: To be determined once lab results are received ROV prn if symptoms persist or worsen.

## 2018-05-23 LAB — CERVICOVAGINAL ANCILLARY ONLY
BACTERIAL VAGINITIS: POSITIVE — AB
Candida vaginitis: NEGATIVE
Chlamydia: NEGATIVE
NEISSERIA GONORRHEA: NEGATIVE
Trichomonas: NEGATIVE

## 2018-05-26 ENCOUNTER — Telehealth: Payer: Self-pay | Admitting: *Deleted

## 2018-05-26 ENCOUNTER — Other Ambulatory Visit: Payer: Self-pay | Admitting: Obstetrics & Gynecology

## 2018-05-26 DIAGNOSIS — N760 Acute vaginitis: Principal | ICD-10-CM

## 2018-05-26 DIAGNOSIS — B9689 Other specified bacterial agents as the cause of diseases classified elsewhere: Secondary | ICD-10-CM

## 2018-05-26 MED ORDER — METRONIDAZOLE 500 MG PO TABS: 500.0000 mg | ORAL_TABLET | Freq: Two times a day (BID) | ORAL | 1 refills | Status: DC

## 2018-05-26 NOTE — Telephone Encounter (Signed)
Called patient to to inform her of her results and [facility].   Scheryl Martenhristine Soliz, RN

## 2018-05-26 NOTE — Telephone Encounter (Signed)
-----   Message from Tereso NewcomerUgonna A Anyanwu, MD sent at 05/26/2018 12:16 PM EST ----- Vaginal discharge test is abnormal and showed bacterial vaginitis. [facility] Please inform patient of results and advise to pick up prescription.

## 2018-05-27 ENCOUNTER — Encounter (HOSPITAL_COMMUNITY): Payer: Self-pay

## 2018-05-27 ENCOUNTER — Ambulatory Visit (HOSPITAL_COMMUNITY)
Admission: EM | Admit: 2018-05-27 | Discharge: 2018-05-27 | Disposition: A | Discharge status: Home / Self Care | Payer: Medicaid Other | Attending: Family Medicine | Admitting: Family Medicine

## 2018-05-27 DIAGNOSIS — J111 Influenza due to unidentified influenza virus with other respiratory manifestations: Secondary | ICD-10-CM | POA: Diagnosis not present

## 2018-05-27 DIAGNOSIS — R69 Illness, unspecified: Secondary | ICD-10-CM

## 2018-05-27 DIAGNOSIS — R509 Fever, unspecified: Secondary | ICD-10-CM | POA: Diagnosis present

## 2018-05-27 LAB — POCT RAPID STREP A: Streptococcus, Group A Screen (Direct): NEGATIVE

## 2018-05-27 MED ORDER — ACETAMINOPHEN 325 MG PO TABS
ORAL_TABLET | ORAL | Status: AC
  Filled 2018-05-27: qty 2

## 2018-05-27 MED ORDER — ONDANSETRON HCL 4 MG PO TABS: 4.0000 mg | ORAL_TABLET | Freq: Four times a day (QID) | ORAL | 0 refills | Status: DC

## 2018-05-27 MED ORDER — ACETAMINOPHEN 325 MG PO TABS
650.0000 mg | ORAL_TABLET | Freq: Once | ORAL | Status: AC
  Administered 2018-05-27: 650 mg via ORAL

## 2018-05-27 MED ORDER — ONDANSETRON 4 MG PO TBDP
4.0000 mg | ORAL_TABLET | Freq: Once | ORAL | Status: AC
  Administered 2018-05-27: 4 mg via ORAL

## 2018-05-27 MED ORDER — OSELTAMIVIR PHOSPHATE 75 MG PO CAPS: 75.0000 mg | ORAL_CAPSULE | Freq: Two times a day (BID) | ORAL | 0 refills | Status: DC

## 2018-05-27 MED ORDER — ONDANSETRON 4 MG PO TBDP
ORAL_TABLET | ORAL | Status: AC
  Filled 2018-05-27: qty 1

## 2018-05-27 NOTE — Discharge Instructions (Addendum)
Rest Push fluids Take zofran for nausea Take the ibuprofen for pain and fever (take with food) Off work a couple of days.  Must not have a fever Tamiflu with help with influenza. Take 2 x a day

## 2018-05-27 NOTE — ED Provider Notes (Signed)
[facility]    CSN: [id number] Arrival date & time: 05/27/18  1005     History   Chief Complaint Chief Complaint  Patient presents with  . Generalized Body Aches  . Fever    HPI Kelli Soto is a 22 y.o. female.   HPI  Patient has a 1 day onset of fever, body aches, headache, fatigue, mild sore throat.  She works in a nursing home.  She does not think she has been exposed to influenza.  Having chills.  Nausea but no vomiting.  No nasal congestion.  No coughing. Patient's otherwise in good health and on no medications  Past Medical History:  Diagnosis Date  . Bronchitis     Patient Active Problem List   Diagnosis Date Noted  . Chlamydia 02/19/2017    Past Surgical History:  Procedure Laterality Date  . NO PAST SURGERIES      OB History    Gravida  1   Para  1   Term  1   Preterm      AB      Living  1     SAB      TAB      Ectopic      Multiple  0   Live Births  1            Home Medications    Prior to Admission medications   Medication Sig Start Date End Date Taking? Authorizing Provider  ondansetron (ZOFRAN) 4 MG tablet Take 1 tablet (4 mg total) by mouth every 6 (six) hours. 05/27/18   Eustace MooreNelson, Arti Trang Sue, MD  oseltamivir (TAMIFLU) 75 MG capsule Take 1 capsule (75 mg total) by mouth every 12 (twelve) hours. 05/27/18   Eustace MooreNelson, Ryley Bachtel Sue, MD    Family History Family History  Problem Relation Age of Onset  . Hypertension Mother   . Diabetes Other     Social History Social History   Tobacco Use  . Smoking status: Never Smoker  . Smokeless tobacco: Never Used  Substance Use Topics  . Alcohol use: No  . Drug use: No     Allergies   Patient has no known allergies.   Review of Systems Review of Systems  Constitutional: Positive for chills, fatigue and fever.  HENT: Positive for sore throat. Negative for ear pain.   Eyes: Negative for pain and visual disturbance.  Respiratory: Negative for cough and  shortness of breath.   Cardiovascular: Negative for chest pain and palpitations.  Gastrointestinal: Negative for abdominal pain and vomiting.  Genitourinary: Negative for dysuria and hematuria.  Musculoskeletal: Positive for myalgias. Negative for arthralgias and back pain.  Skin: Negative for color change and rash.  Neurological: Positive for headaches. Negative for seizures and syncope.  All other systems reviewed and are negative.    Physical Exam Triage Vital Signs ED Triage Vitals  Enc Vitals Group     BP 05/27/18 1122 (!) 118/59     Pulse Rate 05/27/18 1122 81     Resp 05/27/18 1122 18     Temp 05/27/18 1122 100.2 F (37.9 C)     Temp Source 05/27/18 1122 Skin     SpO2 05/27/18 1122 100 %     Weight --      Height --      Head Circumference --      Peak Flow --      Pain Score 05/27/18 1123 10     Pain Loc --  Pain Edu? --      Excl. in GC? --    No data found.  Updated Vital Signs BP (!) 118/59 (BP Location: Right Arm)   Pulse 81   Temp 100.2 F (37.9 C) (Skin)   Resp 18   LMP 05/05/2018   SpO2 100%       Physical Exam  Constitutional: She appears well-developed and well-nourished. She appears distressed.  Appears ill.  Very tired.  HENT:  Head: Normocephalic and atraumatic.  Right Ear: External ear normal.  Left Ear: External ear normal.  Nose: Nose normal.  Mouth/Throat: Oropharynx is clear and moist.  Eyes: Pupils are equal, round, and reactive to light. Conjunctivae are normal.  Neck: Normal range of motion. Neck supple.  Cardiovascular: Normal rate, regular rhythm and normal heart sounds.  Pulmonary/Chest: Effort normal. No respiratory distress.  Abdominal: Soft. Bowel sounds are normal. She exhibits no distension. There is no tenderness.  Musculoskeletal: Normal range of motion. She exhibits no edema.  Lymphadenopathy:    She has cervical adenopathy.  Neurological: She is alert.  Skin: Skin is warm and dry.  Psychiatric: She has a normal  mood and affect. Her behavior is normal.     UC Treatments / Results  Labs (all labs ordered are listed, but only abnormal results are displayed) Labs Reviewed  CULTURE, GROUP A STREP [facility])  POCT RAPID STREP A    EKG None  Radiology No results found.  Procedures Procedures (including critical care time)  Medications Ordered in UC Medications  ondansetron (ZOFRAN-ODT) disintegrating tablet 4 mg (4 mg Oral Given 05/27/18 1219)  acetaminophen (TYLENOL) tablet 650 mg (650 mg Oral Given 05/27/18 1218)    Initial Impression / Assessment and Plan / UC Course  I have reviewed the triage vital signs and the nursing notes.  Pertinent labs & imaging results that were available during my care of the patient were reviewed by me and considered in my medical decision making (see chart for details).    Strep test is negative.  With her sudden onset of fever chills body aches and fatigue, it accompanied by respiratory symptoms, possible she has influenza.  I will treat her for influenza, her push fluids, follow-up if not better in a couple days. Final Clinical Impressions(s) / UC Diagnoses   Final diagnoses:  Influenza-like illness     Discharge Instructions     Rest Push fluids Take zofran for nausea Take the ibuprofen for pain and fever (take with food) Off work a couple of days.  Must not have a fever Tamiflu with help with influenza. Take 2 x a day   ED Prescriptions    Medication Sig Dispense Auth. Provider   ondansetron (ZOFRAN) 4 MG tablet Take 1 tablet (4 mg total) by mouth every 6 (six) hours. 12 tablet Eustace Moore, MD   oseltamivir (TAMIFLU) 75 MG capsule Take 1 capsule (75 mg total) by mouth every 12 (twelve) hours. 10 capsule Eustace Moore, MD     Controlled Substance Prescriptions [place] Controlled Substance Registry consulted? Not Applicable   Eustace Moore, MD 05/27/18 [id number]

## 2018-05-27 NOTE — ED Triage Notes (Addendum)
Pt presents with fever, body aches and soreness in chills. Symptoms started on Sunday night

## 2018-05-28 ENCOUNTER — Other Ambulatory Visit: Payer: Self-pay | Admitting: *Deleted

## 2018-05-28 MED ORDER — METRONIDAZOLE 0.75 % VA GEL: 1.0000 | Freq: Every day | VAGINAL | 1 refills | Status: DC

## 2018-05-29 LAB — CULTURE, GROUP A STREP (THRC)

## 2018-06-20 ENCOUNTER — Other Ambulatory Visit: Payer: Self-pay

## 2018-06-20 ENCOUNTER — Emergency Department (HOSPITAL_COMMUNITY)
Admission: EM | Admit: 2018-06-20 | Discharge: 2018-06-20 | Disposition: A | Discharge status: Home / Self Care | Payer: Medicaid Other | Attending: Emergency Medicine | Admitting: Emergency Medicine

## 2018-06-20 ENCOUNTER — Encounter (HOSPITAL_COMMUNITY): Payer: Self-pay | Admitting: Emergency Medicine

## 2018-06-20 ENCOUNTER — Emergency Department (HOSPITAL_COMMUNITY): Payer: Medicaid Other

## 2018-06-20 ENCOUNTER — Encounter (HOSPITAL_COMMUNITY): Payer: Self-pay | Admitting: Radiology

## 2018-06-20 ENCOUNTER — Emergency Department (HOSPITAL_COMMUNITY)
Admission: EM | Admit: 2018-06-20 | Discharge: 2018-06-20 | Disposition: A | Discharge status: Home / Self Care | Payer: Medicaid Other | Source: Home / Self Care | Attending: Emergency Medicine | Admitting: Emergency Medicine

## 2018-06-20 DIAGNOSIS — R0989 Other specified symptoms and signs involving the circulatory and respiratory systems: Secondary | ICD-10-CM | POA: Insufficient documentation

## 2018-06-20 DIAGNOSIS — R1319 Other dysphagia: Secondary | ICD-10-CM | POA: Insufficient documentation

## 2018-06-20 DIAGNOSIS — Z79899 Other long term (current) drug therapy: Secondary | ICD-10-CM | POA: Insufficient documentation

## 2018-06-20 DIAGNOSIS — Z5321 Procedure and treatment not carried out due to patient leaving prior to being seen by health care provider: Secondary | ICD-10-CM | POA: Insufficient documentation

## 2018-06-20 LAB — BASIC METABOLIC PANEL
Anion gap: 8 (ref 5–15)
BUN: <5 mg/dL — ABNORMAL LOW (ref 6–20)
CO2: 24 mmol/L (ref 22–32)
Calcium: 9.4 mg/dL (ref 8.9–10.3)
Chloride: 106 mmol/L (ref 98–111)
Creatinine, Ser: 0.61 mg/dL (ref 0.44–1.00)
GFR calc non Af Amer: >60 mL/min (ref >60)
Glucose, Bld: 94 mg/dL (ref 70–99)
Potassium: 3.6 mmol/L (ref 3.5–5.1)
SODIUM: 138 mmol/L (ref 135–145)

## 2018-06-20 LAB — CBC WITH DIFFERENTIAL/PLATELET
Abs Immature Granulocytes: 0 10*3/uL (ref 0.00–0.07)
Basophils Absolute: 0 10*3/uL (ref 0.0–0.1)
Basophils Relative: 1 %
Eosinophils Absolute: 0 10*3/uL (ref 0.0–0.5)
Eosinophils Relative: 1 %
HCT: 37.4 % (ref 36.0–46.0)
Hemoglobin: 12.4 g/dL (ref 12.0–15.0)
Immature Granulocytes: 0 %
Lymphocytes Relative: 57 %
Lymphs Abs: 1.7 10*3/uL (ref 0.7–4.0)
MCH: 30.2 pg (ref 26.0–34.0)
MCHC: 33.2 g/dL (ref 30.0–36.0)
MCV: 91.2 fL (ref 80.0–100.0)
Monocytes Absolute: 0.3 10*3/uL (ref 0.1–1.0)
Monocytes Relative: 8 %
NEUTROS PCT: 33 %
NRBC: 0 % (ref 0.0–0.2)
Neutro Abs: 1 10*3/uL — ABNORMAL LOW (ref 1.7–7.7)
PLATELETS: 207 10*3/uL (ref 150–400)
RBC: 4.1 MIL/uL (ref 3.87–5.11)
RDW: 13.2 % (ref 11.5–15.5)
WBC: 3 10*3/uL — ABNORMAL LOW (ref 4.0–10.5)

## 2018-06-20 LAB — PREGNANCY, URINE: PREG TEST UR: NEGATIVE

## 2018-06-20 MED ORDER — LIDOCAINE VISCOUS HCL 2 % MT SOLN: 15.0000 mL | Freq: Four times a day (QID) | OROMUCOSAL | 0 refills | Status: DC | PRN

## 2018-06-20 MED ORDER — IOHEXOL 300 MG/ML  SOLN
75.0000 mL | Freq: Once | INTRAMUSCULAR | Status: AC | PRN
  Administered 2018-06-20: 75 mL via INTRAVENOUS

## 2018-06-20 NOTE — ED Provider Notes (Signed)
[facility] EMERGENCY DEPARTMENT Provider Note   CSN: [id number] Arrival date & time: 06/20/18  1500     History   Chief Complaint Chief Complaint  Patient presents with  . Swallowed Foreign Body    HPI Kelli Soto is a 23 y.o. female.  She said she was eating chicken with bone in it yesterday and she felt like something stuck in her throat.  Since then she has had sharp stabbing pains at the level of her thyroid cartilage.  She is tried to swallow some food and it goes down but it hurts.  She is able to swallow liquids.  No trouble breathing.  This is never happened before.  She has not coughed or thrown up any blood.  The history is provided by the patient.  Swallowed Foreign Body  This is a new problem. The current episode started yesterday. The problem occurs constantly. Pertinent negatives include no chest pain, no abdominal pain, no headaches and no shortness of breath. The symptoms are aggravated by swallowing. Nothing relieves the symptoms. She has tried nothing for the symptoms. The treatment provided no relief.    Past Medical History:  Diagnosis Date  . Bronchitis     Patient Active Problem List   Diagnosis Date Noted  . Chlamydia 02/19/2017    Past Surgical History:  Procedure Laterality Date  . NO PAST SURGERIES       OB History    Gravida  1   Para  1   Term  1   Preterm      AB      Living  1     SAB      TAB      Ectopic      Multiple  0   Live Births  1            Home Medications    Prior to Admission medications   Medication Sig Start Date End Date Taking? Authorizing Provider  metroNIDAZOLE (METROGEL) 0.75 % vaginal gel Place 1 Applicatorful vaginally at bedtime. Apply one applicatorful to vagina at bedtime for 5 days 05/28/18   Anyanwu, Jethro Bastos, MD  ondansetron (ZOFRAN) 4 MG tablet Take 1 tablet (4 mg total) by mouth every 6 (six) hours. 05/27/18   Eustace Moore, MD  oseltamivir (TAMIFLU) 75 MG  capsule Take 1 capsule (75 mg total) by mouth every 12 (twelve) hours. 05/27/18   Eustace Moore, MD    Family History Family History  Problem Relation Age of Onset  . Hypertension Mother   . Diabetes Other     Social History Social History   Tobacco Use  . Smoking status: Never Smoker  . Smokeless tobacco: Never Used  Substance Use Topics  . Alcohol use: No  . Drug use: No     Allergies   Patient has no known allergies.   Review of Systems Review of Systems  Constitutional: Negative for fever.  HENT: Positive for sore throat and trouble swallowing. Negative for voice change.   Eyes: Negative for visual disturbance.  Respiratory: Negative for shortness of breath and stridor.   Cardiovascular: Negative for chest pain.  Gastrointestinal: Negative for abdominal pain.  Genitourinary: Negative for dysuria.  Musculoskeletal: Positive for neck pain.  Skin: Negative for rash.  Neurological: Negative for headaches.     Physical Exam Updated Vital Signs BP 95/68 (BP Location: Left Arm)   Pulse 63   Temp 97.7 F (36.5 C) (Oral)  Resp 16   LMP 06/04/2018 (Exact Date)   SpO2 100%   Physical Exam Vitals signs and nursing note reviewed.  Constitutional:      General: She is not in acute distress.    Appearance: She is well-developed.  HENT:     Head: Normocephalic and atraumatic.     Mouth/Throat:     Mouth: Mucous membranes are moist.     Pharynx: Oropharynx is clear. No oropharyngeal exudate or posterior oropharyngeal erythema.  Eyes:     Conjunctiva/sclera: Conjunctivae normal.  Neck:     Musculoskeletal: Normal range of motion and neck supple. Muscular tenderness present.  Cardiovascular:     Rate and Rhythm: Normal rate and regular rhythm.     Heart sounds: No murmur.  Pulmonary:     Effort: Pulmonary effort is normal. No respiratory distress.     Breath sounds: Normal breath sounds. No stridor. No wheezing or rhonchi.  Abdominal:     Palpations:  Abdomen is soft.     Tenderness: There is no abdominal tenderness.  Musculoskeletal: Normal range of motion.        General: No tenderness or signs of injury.  Skin:    General: Skin is warm and dry.     Capillary Refill: Capillary refill takes less than 2 seconds.  Neurological:     General: No focal deficit present.     Mental Status: She is alert.     Gait: Gait normal.      ED Treatments / Results  Labs (all labs ordered are listed, but only abnormal results are displayed) Labs Reviewed  BASIC METABOLIC PANEL - Abnormal; Notable for the following components:      Result Value   BUN <5 (*)    All other components within normal limits  CBC WITH DIFFERENTIAL/PLATELET - Abnormal; Notable for the following components:   WBC 3.0 (*)    Neutro Abs 1.0 (*)    All other components within normal limits  PREGNANCY, URINE    EKG None  Radiology Ct Soft Tissue Neck Wo Contrast  Result Date: 06/20/2018 CLINICAL DATA:  Abnormal swallowing after eating chicken yesterday. Assess for foreign object. EXAM: CT NECK WITHOUT CONTRAST TECHNIQUE: Multidetector CT imaging of the neck was performed following the standard protocol without intravenous contrast. COMPARISON:  None. FINDINGS: Pharynx and larynx: Normal. No evidence of ingested foreign object in the region studied. A bone should definitely be visible. There is no evidence of soft tissue density material in any of the typical locations such as the valleculae or piriform sinuses. Salivary glands: Normal Thyroid: Normal Lymph nodes: Normal appearance. Vascular: Normal Limited intracranial: Normal Visualized orbits: Normal Mastoids and visualized paranasal sinuses: Clear except for an insignificant retention cyst of the right maxillary sinus. Skeleton: Normal Upper chest: Normal Other: None IMPRESSION: Normal examination. No evidence of ingested foreign object. A chicken bone should definitely be visible. Electronically Signed   By: Paulina Fusi  M.D.   On: 06/20/2018 19:09    Procedures Procedures (including critical care time)  Medications Ordered in ED Medications - No data to display   Initial Impression / Assessment and Plan / ED Course  I have reviewed the triage vital signs and the nursing notes.  Pertinent labs & imaging results that were available during my care of the patient were reviewed by me and considered in my medical decision making (see chart for details).     Final Clinical Impressions(s) / ED Diagnoses   Final diagnoses:  Foreign  body sensation in throat    ED Discharge Orders         Ordered    lidocaine (XYLOCAINE) 2 % solution  Every 6 hours PRN     06/20/18 1951           Terrilee FilesButler,  C, MD 06/21/18 1303

## 2018-06-20 NOTE — ED Triage Notes (Signed)
Pt back today with same complaint of bone stuck in her throat , pt was at [facility] and here last night , unclear if she was seen by a MD

## 2018-06-20 NOTE — ED Notes (Signed)
Pt states she is leaving  

## 2018-06-20 NOTE — ED Triage Notes (Addendum)
Pt reports she feels as though she has swallowed a chicken bone. Pt reports this happened at 1400 hrs yesterday. Pt reports she feels lump in her throat and it hurts to swallow. Pt is in no acute distress and denies SOB.

## 2018-06-20 NOTE — ED Provider Notes (Signed)
Eating chicken yesterday, thinks has bone in throat. Follow up CT. If neg, discharge.  Physical Exam  BP 95/68 (BP Location: Left Arm)   Pulse 63   Temp 97.7 F (36.5 C) (Oral)   Resp 16   LMP 06/04/2018 (Exact Date)   SpO2 100%   Physical Exam Vitals signs and nursing note reviewed.  Constitutional:      Appearance: She is well-developed.     Comments: Resting comfortably.  Tolerating secretions.  Normal work of breathing.  HENT:     Head: Normocephalic and atraumatic.  Eyes:     Conjunctiva/sclera: Conjunctivae normal.  Cardiovascular:     Rate and Rhythm: Normal rate.  Pulmonary:     Effort: Pulmonary effort is normal.  Neurological:     Mental Status: She is alert.  Psychiatric:        Mood and Affect: Mood normal.        Behavior: Behavior normal.     ED Course/Procedures    Results for orders placed or performed during the hospital encounter of 06/20/18  Basic metabolic panel  Result Value Ref Range   Sodium 138 135 - 145 mmol/L   Potassium 3.6 3.5 - 5.1 mmol/L   Chloride 106 98 - 111 mmol/L   CO2 24 22 - 32 mmol/L   Glucose, Bld 94 70 - 99 mg/dL   BUN <5 (L) 6 - 20 mg/dL   Creatinine, Ser 9.24 0.44 - 1.00 mg/dL   Calcium 9.4 8.9 - 26.8 mg/dL   GFR calc non Af Amer >60 >60 mL/min   GFR calc Af Amer >60 >60 mL/min   Anion gap 8 5 - 15  CBC with Differential  Result Value Ref Range   WBC 3.0 (L) 4.0 - 10.5 K/uL   RBC 4.10 3.87 - 5.11 MIL/uL   Hemoglobin 12.4 12.0 - 15.0 g/dL   HCT 34.1 96.2 - 22.9 %   MCV 91.2 80.0 - 100.0 fL   MCH 30.2 26.0 - 34.0 pg   MCHC 33.2 30.0 - 36.0 g/dL   RDW 79.8 92.1 - 19.4 %   Platelets 207 150 - 400 K/uL   nRBC 0.0 0.0 - 0.2 %   Neutrophils Relative % 33 %   Neutro Abs 1.0 (L) 1.7 - 7.7 K/uL   Lymphocytes Relative 57 %   Lymphs Abs 1.7 0.7 - 4.0 K/uL   Monocytes Relative 8 %   Monocytes Absolute 0.3 0.1 - 1.0 K/uL   Eosinophils Relative 1 %   Eosinophils Absolute 0.0 0.0 - 0.5 K/uL   Basophils Relative 1 %   Basophils Absolute 0.0 0.0 - 0.1 K/uL   Immature Granulocytes 0 %   Abs Immature Granulocytes 0.00 0.00 - 0.07 K/uL  Pregnancy, urine  Result Value Ref Range   Preg Test, Ur NEGATIVE NEGATIVE   Ct Soft Tissue Neck Wo Contrast  Result Date: 06/20/2018 CLINICAL DATA:  Abnormal swallowing after eating chicken yesterday. Assess for foreign object. EXAM: CT NECK WITHOUT CONTRAST TECHNIQUE: Multidetector CT imaging of the neck was performed following the standard protocol without intravenous contrast. COMPARISON:  None. FINDINGS: Pharynx and larynx: Normal. No evidence of ingested foreign object in the region studied. A bone should definitely be visible. There is no evidence of soft tissue density material in any of the typical locations such as the valleculae or piriform sinuses. Salivary glands: Normal Thyroid: Normal Lymph nodes: Normal appearance. Vascular: Normal Limited intracranial: Normal Visualized orbits: Normal Mastoids and visualized paranasal sinuses: Clear except  for an insignificant retention cyst of the right maxillary sinus. Skeleton: Normal Upper chest: Normal Other: None IMPRESSION: Normal examination. No evidence of ingested foreign object. A chicken bone should definitely be visible. Electronically Signed   By: Paulina Fusi M.D.   On: 06/20/2018 19:09    Procedures  MDM  CT is negative for foreign body.  Discussed results with patient.  Will prescribe viscous lidocaine for throat discomfort.  PCP follow-up.  Safe for discharge.       , [place] N, PA-C 06/20/18 1952    Terrilee Files, MD 06/21/18 [phone]

## 2018-07-10 ENCOUNTER — Emergency Department
Admission: EM | Admit: 2018-07-10 | Discharge: 2018-07-10 | Disposition: A | Discharge status: Home / Self Care | Payer: Medicaid Other | Attending: Emergency Medicine | Admitting: Emergency Medicine

## 2018-07-10 ENCOUNTER — Emergency Department: Payer: Medicaid Other

## 2018-07-10 ENCOUNTER — Other Ambulatory Visit: Payer: Self-pay

## 2018-07-10 DIAGNOSIS — Y939 Activity, unspecified: Secondary | ICD-10-CM | POA: Diagnosis not present

## 2018-07-10 DIAGNOSIS — Y999 Unspecified external cause status: Secondary | ICD-10-CM | POA: Diagnosis not present

## 2018-07-10 DIAGNOSIS — S8011XA Contusion of right lower leg, initial encounter: Secondary | ICD-10-CM | POA: Insufficient documentation

## 2018-07-10 DIAGNOSIS — M25532 Pain in left wrist: Secondary | ICD-10-CM | POA: Diagnosis not present

## 2018-07-10 DIAGNOSIS — R0789 Other chest pain: Secondary | ICD-10-CM | POA: Insufficient documentation

## 2018-07-10 DIAGNOSIS — R51 Headache: Secondary | ICD-10-CM | POA: Diagnosis present

## 2018-07-10 DIAGNOSIS — S8012XA Contusion of left lower leg, initial encounter: Secondary | ICD-10-CM | POA: Insufficient documentation

## 2018-07-10 DIAGNOSIS — Y92410 Unspecified street and highway as the place of occurrence of the external cause: Secondary | ICD-10-CM | POA: Diagnosis not present

## 2018-07-10 DIAGNOSIS — R1084 Generalized abdominal pain: Secondary | ICD-10-CM | POA: Diagnosis not present

## 2018-07-10 DIAGNOSIS — T07XXXA Unspecified multiple injuries, initial encounter: Secondary | ICD-10-CM

## 2018-07-10 LAB — CBC
HCT: 36.4 % (ref 36.0–46.0)
Hemoglobin: 11.8 g/dL — ABNORMAL LOW (ref 12.0–15.0)
MCH: 29.5 pg (ref 26.0–34.0)
MCHC: 32.4 g/dL (ref 30.0–36.0)
MCV: 91 fL (ref 80.0–100.0)
Platelets: 202 10*3/uL (ref 150–400)
RBC: 4 MIL/uL (ref 3.87–5.11)
RDW: 13.5 % (ref 11.5–15.5)
WBC: 3.1 10*3/uL — ABNORMAL LOW (ref 4.0–10.5)
nRBC: 0 % (ref 0.0–0.2)

## 2018-07-10 LAB — HEPATIC FUNCTION PANEL
ALBUMIN: 4 g/dL (ref 3.5–5.0)
ALT: 15 U/L (ref 0–44)
AST: 21 U/L (ref 15–41)
Alkaline Phosphatase: 41 U/L (ref 38–126)
Bilirubin, Direct: 0.2 mg/dL (ref 0.0–0.2)
Indirect Bilirubin: 1 mg/dL — ABNORMAL HIGH (ref 0.3–0.9)
Total Bilirubin: 1.2 mg/dL (ref 0.3–1.2)
Total Protein: 6.9 g/dL (ref 6.5–8.1)

## 2018-07-10 LAB — PREGNANCY, URINE: Preg Test, Ur: NEGATIVE

## 2018-07-10 LAB — BASIC METABOLIC PANEL
Anion gap: 5 (ref 5–15)
BUN: 7 mg/dL (ref 6–20)
CO2: 26 mmol/L (ref 22–32)
Calcium: 9.2 mg/dL (ref 8.9–10.3)
Chloride: 108 mmol/L (ref 98–111)
Creatinine, Ser: 0.62 mg/dL (ref 0.44–1.00)
GFR calc Af Amer: >60 mL/min (ref >60)
GFR calc non Af Amer: >60 mL/min (ref >60)
Glucose, Bld: 93 mg/dL (ref 70–99)
POTASSIUM: 3.9 mmol/L (ref 3.5–5.1)
Sodium: 139 mmol/L (ref 135–145)

## 2018-07-10 LAB — LIPASE, BLOOD: Lipase: 22 U/L (ref 11–51)

## 2018-07-10 LAB — TROPONIN I: Troponin I: <0.03 ng/mL (ref <0.03)

## 2018-07-10 MED ORDER — KETOROLAC TROMETHAMINE 10 MG PO TABS: 10.0000 mg | ORAL_TABLET | Freq: Four times a day (QID) | ORAL | 0 refills | Status: DC | PRN

## 2018-07-10 MED ORDER — IOHEXOL 300 MG/ML  SOLN
75.0000 mL | Freq: Once | INTRAMUSCULAR | Status: AC | PRN
  Administered 2018-07-10: 75 mL via INTRAVENOUS
  Filled 2018-07-10: qty 75

## 2018-07-10 MED ORDER — KETOROLAC TROMETHAMINE 30 MG/ML IJ SOLN
15.0000 mg | INTRAMUSCULAR | Status: AC
  Administered 2018-07-10: 15 mg via INTRAVENOUS
  Filled 2018-07-10: qty 1
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
## 2018-07-10 NOTE — Discharge Instructions (Signed)
Your x-rays of the wrist and knee were okay today.  Your CT scans did not show any serious injuries.  You will continue to have a lot of stiffness and soreness over the next several days, which should gradually improve.  Continue wearing the wrist brace for the next week, and follow-up with orthopedics for further assessment of your left wrist pain.
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
## 2018-07-10 NOTE — ED Notes (Signed)
AAOx3.  Skin warm and dry.  NAD 

## 2018-12-09 ENCOUNTER — Telehealth: Payer: Self-pay | Admitting: Radiology

## 2018-12-09 NOTE — Telephone Encounter (Signed)
Left message to call [facility] to reschedule appointment for Annual Exam with Dr Ernestina Patches 12/10/18,

## 2018-12-10 ENCOUNTER — Ambulatory Visit: Payer: Medicaid Other | Admitting: Family Medicine

## 2018-12-11 ENCOUNTER — Other Ambulatory Visit (HOSPITAL_COMMUNITY)
Admission: RE | Admit: 2018-12-11 | Discharge: 2018-12-11 | Disposition: A | Discharge status: Home / Self Care | Payer: Medicaid Other | Source: Ambulatory Visit | Attending: Obstetrics and Gynecology | Admitting: Obstetrics and Gynecology

## 2018-12-11 ENCOUNTER — Encounter: Payer: Self-pay | Admitting: Obstetrics and Gynecology

## 2018-12-11 ENCOUNTER — Ambulatory Visit: Payer: Medicaid Other | Admitting: Obstetrics and Gynecology

## 2018-12-11 ENCOUNTER — Other Ambulatory Visit: Payer: Self-pay

## 2018-12-11 ENCOUNTER — Ambulatory Visit (INDEPENDENT_AMBULATORY_CARE_PROVIDER_SITE_OTHER): Payer: Medicaid Other | Admitting: Obstetrics and Gynecology

## 2018-12-11 VITALS — BP 110/64 | HR 68 | Ht 63.0 in | Wt 121.0 lb

## 2018-12-11 DIAGNOSIS — Z3009 Encounter for other general counseling and advice on contraception: Secondary | ICD-10-CM

## 2018-12-11 DIAGNOSIS — Z01419 Encounter for gynecological examination (general) (routine) without abnormal findings: Secondary | ICD-10-CM

## 2018-12-11 DIAGNOSIS — Z0000 Encounter for general adult medical examination without abnormal findings: Secondary | ICD-10-CM | POA: Diagnosis not present

## 2018-12-11 NOTE — Progress Notes (Signed)
Obstetrics and Gynecology Annual Patient Evaluation  Appointment Date: 12/11/2018  [facility]  Primary Care Provider: System, Provider Not In  Referring Provider: No ref. provider found  Chief Complaint:  Chief Complaint  Patient presents with  . Gynecologic Exam    History of Present Illness: Kelli Soto is a 23 y.o. African-American [id number] (Patient's last menstrual period was 11/28/2018 (exact date).), seen for the above chief complaint. Her past medical history is significant for h/o STI  [facility].   No breast s/s, fevers, chills, chest pain, SOB, nausea, vomiting, abdominal pain, dysuria, hematuria, vaginal itching, dyspareunia, diarrhea, constipation, blood in BMs  Review of Systems: as noted in the History of Present Illness.  Past Medical History:  Past Medical History:  Diagnosis Date  . Bronchitis     Past Surgical History:  Past Surgical History:  Procedure Laterality Date  . NO PAST SURGERIES      Past Obstetrical History:  OB History  Gravida Para Term Preterm AB Living  1 1 1     1   SAB TAB Ectopic Multiple Live Births        0 1    # Outcome Date GA Lbr Len/2nd Weight Sex Delivery Anes PTL Lv  1 Term 05/13/15 [id number] 10:55 / 00:27 5 lb 6.4 oz (2.45 kg) F Vag-Spont EPI  LIV     Birth Comments: none    Past Gynecological History: As per HPI. Periods: qmonth, regular, 7 days, somewhat heavy and painful History of Pap Smear(s): No.  History of STI(s): Yes.   She is currently using no method for contraception.   Social History:  Social History   Socioeconomic History  . Marital status: Single    Spouse name: Not on file  . Number of children: Not on file  . Years of education: Not on file  . Highest education level: Not on file  Occupational History  . Not on file  Social Needs  . Financial resource strain: Not on file  . Food insecurity    Worry: Not on file    Inability: Not on  file  . Transportation needs    Medical: Not on file    Non-medical: Not on file  Tobacco Use  . Smoking status: Never Smoker  . Smokeless tobacco: Never Used  Substance and Sexual Activity  . Alcohol use: No  . Drug use: No  . Sexual activity: Not Currently    Birth control/protection: Condom  Lifestyle  . Physical activity    Days per week: Not on file    Minutes per session: Not on file  . Stress: Not on file  Relationships  . Social Musicianconnections    Talks on phone: Not on file    Gets together: Not on file    Attends religious service: Not on file    Active member of club or organization: Not on file    Attends meetings of clubs or organizations: Not on file    Relationship status: Not on file  . Intimate partner violence    Fear of current or ex partner: Not on file    Emotionally abused: Not on file    Physically abused: Not on file    Forced sexual activity: Not on file  Other Topics Concern  . Not on file  Social History Narrative  . Not on file    Family History:  Family History  Problem Relation Age of Onset  . Hypertension Mother   .  Diabetes Other    She denies any female cancers, bleeding or blood clotting disorders.   Medications None  Allergies Patient has no known allergies.   Physical Exam:  BP 110/64   Pulse 68   Ht 5\' 3"  (1.6 m)   Wt 121 lb (54.9 kg)   LMP 11/28/2018 (Exact Date)   BMI 21.43 kg/m  Body mass index is 21.43 kg/m. General appearance: Well nourished, well developed female in no acute distress.  Neck:  Supple, normal appearance, and no thyromegaly  Cardiovascular: normal s1 and s2.  No murmurs, rubs or gallops. Respiratory:  Clear to auscultation bilateral. Normal respiratory effort Abdomen: positive bowel sounds and no masses, hernias; diffusely non tender to palpation, non distended Breasts:pt denies any breast s/s. Neuro/Psych:  Normal mood and affect.  Skin:  Warm and dry.  Lymphatic:  No inguinal lymphadenopathy.    Pelvic exam: is not limited by body habitus EGBUS: within normal limits, Vagina: within normal limits and with no blood or discharge in the vault, Cervix: normal appearing cervix without tenderness, discharge or lesions. Uterus:  nonenlarged and non tender and Adnexa:  normal adnexa and no mass, fullness, tenderness Rectovaginal: deferred  Laboratory: None  Radiology: None  Assessment: Pt doing well  Plan: *Well Woman: routine care. Pap done and basic screening labs. Pt trying to gain weight. No s/s of hyperthyroidism. Will check TSH *Contraception management: options d/w her. Pt denies h/o migraines, VTEs. Pt on depo provera for a few years in her teens and like it. Pt leaning towards depo provera or patch. Pt to consider options and let us know.   RTC PRN  Durene Romans MD Attending [facility] farm manager)

## 2018-12-12 ENCOUNTER — Encounter: Payer: Self-pay | Admitting: Obstetrics and Gynecology

## 2018-12-12 LAB — COMPREHENSIVE METABOLIC PANEL
ALT: 15 IU/L (ref 0–32)
AST: 16 IU/L (ref 0–40)
Albumin/Globulin Ratio: 1.9 (ref 1.2–2.2)
Albumin: 4.5 g/dL (ref 3.9–5.0)
Alkaline Phosphatase: 49 IU/L (ref 39–117)
BUN/Creatinine Ratio: 11 (ref 9–23)
BUN: 8 mg/dL (ref 6–20)
Bilirubin Total: 0.5 mg/dL (ref 0.0–1.2)
CO2: 22 mmol/L (ref 20–29)
Calcium: 9.4 mg/dL (ref 8.7–10.2)
Chloride: 103 mmol/L (ref 96–106)
Creatinine, Ser: 0.71 mg/dL (ref 0.57–1.00)
GFR calc Af Amer: 140 mL/min/{1.73_m2} (ref >59)
GFR calc non Af Amer: 121 mL/min/{1.73_m2} (ref >59)
Globulin, Total: 2.4 g/dL (ref 1.5–4.5)
Glucose: 87 mg/dL (ref 65–99)
Potassium: 4.2 mmol/L (ref 3.5–5.2)
Sodium: 140 mmol/L (ref 134–144)
Total Protein: 6.9 g/dL (ref 6.0–8.5)

## 2018-12-12 LAB — LIPID PANEL
Chol/HDL Ratio: 2.6 ratio (ref 0.0–4.4)
Cholesterol, Total: 144 mg/dL (ref 100–199)
HDL: 55 mg/dL (ref >39)
LDL Calculated: 82 mg/dL (ref 0–99)
Triglycerides: 34 mg/dL (ref 0–149)
VLDL Cholesterol Cal: 7 mg/dL (ref 5–40)

## 2018-12-12 LAB — CBC
Hematocrit: 35.3 % (ref 34.0–46.6)
Hemoglobin: 11.8 g/dL (ref 11.1–15.9)
MCH: 30 pg (ref 26.6–33.0)
MCHC: 33.4 g/dL (ref 31.5–35.7)
MCV: 90 fL (ref 79–97)
Platelets: 184 10*3/uL (ref 150–450)
RBC: 3.93 x10E6/uL (ref 3.77–5.28)
RDW: 12.5 % (ref 11.7–15.4)
WBC: 2.8 10*3/uL — ABNORMAL LOW (ref 3.4–10.8)

## 2018-12-12 LAB — TSH: TSH: 0.786 u[IU]/mL (ref 0.450–4.500)

## 2018-12-12 LAB — VITAMIN D 25 HYDROXY (VIT D DEFICIENCY, FRACTURES): Vit D, 25-Hydroxy: 21.9 ng/mL — ABNORMAL LOW (ref 30.0–100.0)

## 2018-12-17 LAB — CYTOLOGY - PAP
Chlamydia: NEGATIVE
Diagnosis: UNDETERMINED — AB
HPV: NOT DETECTED
Neisseria Gonorrhea: NEGATIVE
Trichomonas: NEGATIVE

## 2018-12-29 ENCOUNTER — Telehealth: Payer: Self-pay | Admitting: Obstetrics and Gynecology

## 2018-12-29 ENCOUNTER — Other Ambulatory Visit: Payer: Self-pay | Admitting: Obstetrics and Gynecology

## 2018-12-29 DIAGNOSIS — D709 Neutropenia, unspecified: Secondary | ICD-10-CM | POA: Insufficient documentation

## 2018-12-29 DIAGNOSIS — R7989 Other specified abnormal findings of blood chemistry: Secondary | ICD-10-CM

## 2018-12-29 DIAGNOSIS — D708 Other neutropenia: Secondary | ICD-10-CM

## 2018-12-29 MED ORDER — CHOLECALCIFEROL 25 MCG (1000 UT) PO CAPS: 2000.0000 [IU] | ORAL_CAPSULE | Freq: Every day | ORAL | 3 refills | Status: DC

## 2018-12-29 NOTE — Telephone Encounter (Signed)
GYN Telelphone Note D/w her re: low vitamin d levels and will send in po supplementation D/w her re: ascus/hpv neg pap and recommend rpt in 3 years D/w her re: neutropenia and recommend referral to heme and she is amenable to this  Durene Romans MD Attending [facility] (Faculty Practice) 12/29/2018 Time: 1236pm

## 2019-01-05 ENCOUNTER — Encounter (HOSPITAL_COMMUNITY): Payer: Self-pay | Admitting: Certified Nurse Midwife

## 2019-01-05 ENCOUNTER — Inpatient Hospital Stay: Payer: Medicaid Other

## 2019-01-05 ENCOUNTER — Inpatient Hospital Stay: Payer: Medicaid Other | Attending: Internal Medicine | Admitting: Internal Medicine

## 2019-01-05 ENCOUNTER — Inpatient Hospital Stay (HOSPITAL_COMMUNITY): Payer: Medicaid Other

## 2019-01-05 ENCOUNTER — Inpatient Hospital Stay (HOSPITAL_COMMUNITY)
Admission: AD | Admit: 2019-01-05 | Discharge: 2019-01-05 | Disposition: A | Discharge status: Home / Self Care | Payer: Medicaid Other | Attending: Obstetrics and Gynecology | Admitting: Obstetrics and Gynecology

## 2019-01-05 ENCOUNTER — Encounter: Payer: Self-pay | Admitting: Internal Medicine

## 2019-01-05 ENCOUNTER — Other Ambulatory Visit: Payer: Self-pay

## 2019-01-05 VITALS — BP 100/66 | HR 59 | Temp 97.8°F | Resp 16 | Wt 121.8 lb

## 2019-01-05 DIAGNOSIS — O26891 Other specified pregnancy related conditions, first trimester: Secondary | ICD-10-CM | POA: Diagnosis not present

## 2019-01-05 DIAGNOSIS — D708 Other neutropenia: Secondary | ICD-10-CM

## 2019-01-05 DIAGNOSIS — D509 Iron deficiency anemia, unspecified: Secondary | ICD-10-CM | POA: Insufficient documentation

## 2019-01-05 DIAGNOSIS — Z833 Family history of diabetes mellitus: Secondary | ICD-10-CM | POA: Diagnosis not present

## 2019-01-05 DIAGNOSIS — D72819 Decreased white blood cell count, unspecified: Secondary | ICD-10-CM | POA: Diagnosis not present

## 2019-01-05 DIAGNOSIS — O9989 Other specified diseases and conditions complicating pregnancy, childbirth and the puerperium: Secondary | ICD-10-CM | POA: Diagnosis not present

## 2019-01-05 DIAGNOSIS — Z79899 Other long term (current) drug therapy: Secondary | ICD-10-CM

## 2019-01-05 DIAGNOSIS — Z331 Pregnant state, incidental: Secondary | ICD-10-CM | POA: Diagnosis not present

## 2019-01-05 DIAGNOSIS — Z3490 Encounter for supervision of normal pregnancy, unspecified, unspecified trimester: Secondary | ICD-10-CM

## 2019-01-05 DIAGNOSIS — O210 Mild hyperemesis gravidarum: Secondary | ICD-10-CM | POA: Diagnosis not present

## 2019-01-05 DIAGNOSIS — R109 Unspecified abdominal pain: Secondary | ICD-10-CM | POA: Diagnosis not present

## 2019-01-05 DIAGNOSIS — Z3A01 Less than 8 weeks gestation of pregnancy: Secondary | ICD-10-CM | POA: Insufficient documentation

## 2019-01-05 DIAGNOSIS — O26899 Other specified pregnancy related conditions, unspecified trimester: Secondary | ICD-10-CM

## 2019-01-05 HISTORY — DX: Neutropenia, unspecified: D70.9

## 2019-01-05 LAB — IRON AND TIBC
Iron: 48 ug/dL (ref 28–170)
Saturation Ratios: 12 % (ref 10.4–31.8)
TIBC: 403 ug/dL (ref 250–450)
UIBC: 355 ug/dL

## 2019-01-05 LAB — CBC
HCT: 34.5 % — ABNORMAL LOW (ref 36.0–46.0)
Hemoglobin: 11.9 g/dL — ABNORMAL LOW (ref 12.0–15.0)
MCH: 31.1 pg (ref 26.0–34.0)
MCHC: 34.5 g/dL (ref 30.0–36.0)
MCV: 90.1 fL (ref 80.0–100.0)
Platelets: 188 10*3/uL (ref 150–400)
RBC: 3.83 MIL/uL — ABNORMAL LOW (ref 3.87–5.11)
RDW: 12.5 % (ref 11.5–15.5)
WBC: 3.9 10*3/uL — ABNORMAL LOW (ref 4.0–10.5)
nRBC: 0 % (ref 0.0–0.2)

## 2019-01-05 LAB — CBC WITH DIFFERENTIAL/PLATELET
Abs Immature Granulocytes: 0 10*3/uL (ref 0.00–0.07)
Basophils Absolute: 0 10*3/uL (ref 0.0–0.1)
Basophils Relative: 1 %
Eosinophils Absolute: 0 10*3/uL (ref 0.0–0.5)
Eosinophils Relative: 1 %
HCT: 35.9 % — ABNORMAL LOW (ref 36.0–46.0)
Hemoglobin: 11.9 g/dL — ABNORMAL LOW (ref 12.0–15.0)
Immature Granulocytes: 0 %
Lymphocytes Relative: 54 %
Lymphs Abs: 1.7 10*3/uL (ref 0.7–4.0)
MCH: 30.2 pg (ref 26.0–34.0)
MCHC: 33.1 g/dL (ref 30.0–36.0)
MCV: 91.1 fL (ref 80.0–100.0)
Monocytes Absolute: 0.3 10*3/uL (ref 0.1–1.0)
Monocytes Relative: 9 %
Neutro Abs: 1.1 10*3/uL — ABNORMAL LOW (ref 1.7–7.7)
Neutrophils Relative %: 35 %
Platelets: 168 10*3/uL (ref 150–400)
RBC: 3.94 MIL/uL (ref 3.87–5.11)
RDW: 12.6 % (ref 11.5–15.5)
WBC: 3.1 10*3/uL — ABNORMAL LOW (ref 4.0–10.5)
nRBC: 0 % (ref 0.0–0.2)

## 2019-01-05 LAB — FOLATE: Folate: 5.7 ng/mL — ABNORMAL LOW (ref >5.9)

## 2019-01-05 LAB — WET PREP, GENITAL
Clue Cells Wet Prep HPF POC: NONE SEEN
Sperm: NONE SEEN
Trich, Wet Prep: NONE SEEN
Yeast Wet Prep HPF POC: NONE SEEN

## 2019-01-05 LAB — POCT PREGNANCY, URINE: Preg Test, Ur: POSITIVE — AB

## 2019-01-05 LAB — FERRITIN: Ferritin: 33 ng/mL (ref 11–307)

## 2019-01-05 LAB — HCG, QUANTITATIVE, PREGNANCY: hCG, Beta Chain, Quant, S: 34331 m[IU]/mL — ABNORMAL HIGH (ref <5)

## 2019-01-05 LAB — VITAMIN B12: Vitamin B-12: 233 pg/mL (ref 180–914)

## 2019-01-05 MED ORDER — PRENATAL VITAMIN 27-0.8 MG PO TABS: 1.0000 | ORAL_TABLET | Freq: Every day | ORAL | 8 refills | Status: DC

## 2019-01-05 MED ORDER — PROMETHAZINE HCL 25 MG PO TABS: 12.5000 mg | ORAL_TABLET | Freq: Four times a day (QID) | ORAL | 0 refills | Status: DC | PRN

## 2019-01-05 MED ORDER — ACETAMINOPHEN 500 MG PO TABS
1000.0000 mg | ORAL_TABLET | Freq: Four times a day (QID) | ORAL | Status: DC | PRN
  Administered 2019-01-05: 1000 mg via ORAL
  Filled 2019-01-05: qty 2

## 2019-01-05 MED ORDER — DOXYLAMINE-PYRIDOXINE 10-10 MG PO TBEC: 2.0000 | DELAYED_RELEASE_TABLET | Freq: Every day | ORAL | 1 refills | Status: DC

## 2019-01-05 NOTE — Progress Notes (Signed)
Patient referred by OB/GYN with neutropenia on recent labs.  Does have history of neutropenia and anemia that was evaluated by MD in [facility] when she was in high school.    Took a pregnancy test over the weekend and was positive.  She is planing on calling her OB/GYN today.

## 2019-01-05 NOTE — MAU Note (Signed)
Pt reports to MAU stating she had a +pregnancy test at home (multiple) and she is having abdominal pain that wakes her up out of her sleep. Pt reports the after hours nurse told her to come get checked to make sure its not ectopic pregnancy. Pt reports it is a 5-6/10 "crapmy." no vaginal bleeding or abdominal discharge. LMP was June 12th 2020.

## 2019-01-05 NOTE — Discharge Instructions (Signed)
Abdominal Pain During Pregnancy  Belly (abdominal) pain is common during pregnancy. There are many possible causes. Most of the time, it is not a serious problem. Other times, it can be a sign that something is wrong with the pregnancy. Always tell your doctor if you have belly pain. Follow these instructions at home:  Do not have sex or put anything in your vagina until your pain goes away completely.  Get plenty of rest until your pain gets better.  Drink enough fluid to keep your pee (urine) pale yellow.  Take over-the-counter and prescription medicines only as told by your doctor.  Keep all follow-up visits as told by your doctor. This is important. Contact a doctor if:  Your pain continues or gets worse after resting.  You have lower belly pain that: ? Comes and goes at regular times. ? Spreads to your back. ? Feels like menstrual cramps.  You have pain or burning when you pee (urinate). Get help right away if:  You have a fever or chills.  You have vaginal bleeding.  You are leaking fluid from your vagina.  You are passing tissue from your vagina.  You throw up (vomit) for more than 24 hours.  You have watery poop (diarrhea) for more than 24 hours.  Your baby is moving less than usual.  You feel very weak or faint.  You have shortness of breath.  You have very bad pain in your upper belly. Summary  Belly (abdominal) pain is common during pregnancy. There are many possible causes.  If you have belly pain during pregnancy, tell your doctor right away.  Keep all follow-up visits as told by your doctor. This is important. This information is not intended to replace advice given to you by your health care provider. Make sure you discuss any questions you have with your health care provider. Document Released: 05/23/2009 Document Revised: 09/22/2018 Document Reviewed: 09/06/2016 Elsevier Patient Education  [place].   Morning Sickness  Morning  sickness is when you feel sick to your stomach (nauseous) during pregnancy. You may feel sick to your stomach and throw up (vomit). You may feel sick in the morning, but you can feel this way at any time of day. Some women feel very sick to their stomach and cannot stop throwing up (hyperemesis gravidarum). Follow these instructions at home: Medicines  Take over-the-counter and prescription medicines only as told by your doctor. Do not take any medicines until you talk with your doctor about them first.  Taking multivitamins before getting pregnant can stop or lessen the harshness of morning sickness. Eating and drinking  Eat dry toast or crackers before getting out of bed.  Eat 5 or 6 small meals a day.  Eat dry and bland foods like rice and baked potatoes.  Do not eat greasy, fatty, or spicy foods.  Have someone cook for you if the smell of food causes you to feel sick or throw up.  If you feel sick to your stomach after taking prenatal vitamins, take them at night or with a snack.  Eat protein when you need a snack. Nuts, yogurt, and cheese are good choices.  Drink fluids throughout the day.  Try ginger ale made with real ginger, ginger tea made from fresh grated ginger, or ginger candies. General instructions  Do not use any products that have nicotine or tobacco in them, such as cigarettes and e-cigarettes. If you need help quitting, ask your doctor.  Use an air purifier to  keep the air in your house free of smells.  Get lots of fresh air.  Try to avoid smells that make you feel sick.  Try: ? Wearing a bracelet that is used for seasickness (acupressure wristband). ? Going to a doctor who puts thin needles into certain body points (acupuncture) to improve how you feel. Contact a doctor if:  You need medicine to feel better.  You feel dizzy or light-headed.  You are losing weight. Get help right away if:  You feel very sick to your stomach and cannot stop throwing  up.  You pass out (faint).  You have very bad pain in your belly. Summary  Morning sickness is when you feel sick to your stomach (nauseous) during pregnancy.  You may feel sick in the morning, but you can feel this way at any time of day.  Making some changes to what you eat may help your symptoms go away. This information is not intended to replace advice given to you by your health care provider. Make sure you discuss any questions you have with your health care provider. Document Released: 07/12/2004 Document Revised: 05/17/2017 Document Reviewed: 07/05/2016 Elsevier Patient Education  2020 [employer]

## 2019-01-05 NOTE — Assessment & Plan Note (Addendum)
#   Neutropenia/Leucopenia-longstanding intermittent.  Asymptomatic.  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  Also reviewed the labs/work-up done at [facility].   #Mild anemia-check iron studies ferritin LDH.  Folic acid  # Pregnancy test- positive; awaiting evaluation gynecology.  # DISPOSITION; # labs today [cbc/LDH/iron studies/ferritin/ folate/b12/HIV] # Follow up as needed- Dr.B  Addendum: Iron saturation 12% ferritin is 33; hemoglobin 70.3 folic acid slightly low at 5.4.  Recommend iron pill 1 every other day over-the-counter/folic acid once a day.  MyChart message sent.

## 2019-01-05 NOTE — MAU Provider Note (Signed)
History     CSN: [id number]  Arrival date and time: 01/05/19 2023   First Provider Initiated Contact with Patient 01/05/19 2058      Chief Complaint  Patient presents with  . Abdominal Pain  . Possible Pregnancy   G2P1001 @[redacted]w[redacted]d  by LMP presenting with abd pain. Pain started 1-2 weeks ago. Pain is located in mid to lower abdomen, bilateral and central. Rates 6/10. Pain is worse at night. Denies VB and discharge. No fevers.    OB History    Gravida  2   Para  1   Term  1   Preterm      AB      Living  1     SAB      TAB      Ectopic      Multiple  0   Live Births  1           Past Medical History:  Diagnosis Date  . Bronchitis   . Chlamydia 02/19/2017  . Gonorrhea 2019  . Neutropenia [facility](HCC)     Past Surgical History:  Procedure Laterality Date  . NO PAST SURGERIES      Family History  Problem Relation Age of Onset  . Hypertension Mother   . Diabetes Other     Social History   Tobacco Use  . Smoking status: Never Smoker  . Smokeless tobacco: Never Used  Substance Use Topics  . Alcohol use: No  . Drug use: No    Allergies: No Known Allergies  Medications Prior to Admission  Medication Sig Dispense Refill Last Dose  . Cholecalciferol 25 MCG (1000 UT) capsule Take 2 capsules (2,000 Units total) by mouth daily. 180 capsule 3   . ketorolac (TORADOL) 10 MG tablet Take 1 tablet (10 mg total) by mouth every 6 (six) hours as needed for moderate pain. (Patient not taking: Reported on 12/11/2018) 12 tablet 0   . lidocaine (XYLOCAINE) 2 % solution Use as directed 15 mLs in the mouth or throat every 6 (six) hours as needed (throat pain). (Patient not taking: Reported on 12/11/2018) 100 mL 0   . metroNIDAZOLE (METROGEL) 0.75 % vaginal gel Place 1 Applicatorful vaginally at bedtime. Apply one applicatorful to vagina at bedtime for 5 days (Patient not taking: Reported on 12/11/2018) 70 g 1   . ondansetron (ZOFRAN) 4 MG tablet Take 1 tablet (4 mg total) by  mouth every 6 (six) hours. (Patient not taking: Reported on 12/11/2018) 12 tablet 0   . oseltamivir (TAMIFLU) 75 MG capsule Take 1 capsule (75 mg total) by mouth every 12 (twelve) hours. (Patient not taking: Reported on 01/05/2019) 10 capsule 0     Review of Systems  Constitutional: Negative for chills and fever.  Gastrointestinal: Positive for abdominal pain and nausea. Negative for constipation, diarrhea and vomiting.  Genitourinary: Negative for vaginal bleeding and vaginal discharge.   Physical Exam   Blood pressure (!) 130/45, pulse 60, temperature 98.3 F (36.8 C), temperature source Oral, resp. rate 17, last menstrual period 11/28/2018, SpO2 100 %.  Physical Exam  Nursing note and vitals reviewed. Constitutional: She is oriented to person, place, and time. She appears well-developed and well-nourished. No distress.  HENT:  Head: Normocephalic and atraumatic.  Neck: Normal range of motion.  Cardiovascular: Normal rate.  Respiratory: Effort normal. No respiratory distress.  GI: Soft. She exhibits no distension and no mass. There is no abdominal tenderness. There is no rebound and no guarding.  Genitourinary:  Genitourinary Comments: External: no lesions or erythema Vagina: rugated, pink, moist, scant thin white discharge Uterus: non enlarged, anteverted, non tender, no CMT Adnexae: no masses, no tenderness left, no tenderness right Cervix closed    Musculoskeletal: Normal range of motion.  Neurological: She is alert and oriented to person, place, and time.  Skin: Skin is warm and dry.  Psychiatric: She has a normal mood and affect.   Results for orders placed or performed during the hospital encounter of 01/05/19 (from the past 24 hour(s))  Pregnancy, urine POC     Status: Abnormal   Collection Time: 01/05/19  8:38 PM  Result Value Ref Range   Preg Test, Ur POSITIVE (A) NEGATIVE  Wet prep, genital     Status: Abnormal   Collection Time: 01/05/19  8:59 PM  Result Value  Ref Range   Yeast Wet Prep HPF POC NONE SEEN NONE SEEN   Trich, Wet Prep NONE SEEN NONE SEEN   Clue Cells Wet Prep HPF POC NONE SEEN NONE SEEN   WBC, Wet Prep HPF POC FEW (A) NONE SEEN   Sperm NONE SEEN   CBC     Status: Abnormal   Collection Time: 01/05/19  9:18 PM  Result Value Ref Range   WBC 3.9 (L) 4.0 - 10.5 K/uL   RBC 3.83 (L) 3.87 - 5.11 MIL/uL   Hemoglobin 11.9 (L) 12.0 - 15.0 g/dL   HCT 96.034.5 (L) 45.436.0 - 09.846.0 %   MCV 90.1 80.0 - 100.0 fL   MCH 31.1 26.0 - 34.0 pg   MCHC 34.5 30.0 - 36.0 g/dL   RDW 11.912.5 14.711.5 - 82.915.5 %   Platelets 188 150 - 400 K/uL   nRBC 0.0 0.0 - 0.2 %   [place] Ob Less Than 14 Weeks With Ob Transvaginal  Result Date: 01/05/2019 CLINICAL DATA:  23 year old pregnant female with abdominal pain. LMP: 11/28/2018 corresponding to an estimated gestational age of [redacted] weeks, 3 days. EXAM: OBSTETRIC <14 WK US AND TRANSVAGINAL OB US TECHNIQUE: Both transabdominal and transvaginal ultrasound examinations were performed for complete evaluation of the gestation as well as the maternal uterus, adnexal regions, and pelvic cul-de-sac. Transvaginal technique was performed to assess early pregnancy. COMPARISON:  None. FINDINGS: Intrauterine gestational sac: Single intrauterine gestational sac. Yolk sac:  Seen Embryo:  Not present MSD: 12 mm   5 w   6 d Subchorionic hemorrhage:  None visualized. Maternal uterus/adnexae: The maternal ovaries are unremarkable. Corpus luteum is noted in the left ovary. No significant free fluid within the pelvis. IMPRESSION: Single intrauterine gestational sac with an estimated gestational age of [redacted] weeks, 6 days. No fetal pole identified at this time. Follow-up with ultrasound in 7-11 days, or earlier if clinically indicated, recommended. Electronically Signed   By: Elgie CollardArash  Radparvar M.D.   On: 01/05/2019 21:46   MAU Course  Procedures Orders Placed This Encounter  Procedures  . Wet prep, genital    Standing Status:   Standing    Number of Occurrences:   1     Order Specific Question:   Patient immune status    Answer:   Normal  . US OB LESS THAN 14 WEEKS WITH OB TRANSVAGINAL    Standing Status:   Standing    Number of Occurrences:   1    Order Specific Question:   Symptom/Reason for Exam    Answer:   Abdominal pain in pregnancy [[id number]][[id number]]  . CBC    Standing Status:   Standing  Number of Occurrences:   1  . hCG, quantitative, pregnancy    Standing Status:   Standing    Number of Occurrences:   1  . Pregnancy, urine POC    Standing Status:   Standing    Number of Occurrences:   1  . Discharge patient    Order Specific Question:   Discharge disposition    Answer:   01-Home or Self Care [1]    Order Specific Question:   Discharge patient date    Answer:   01/05/2019   MDM Labs and [place] ordered and reviewed. IUGS and YS seen on [place], no FP. Pain improved with Tylenol. Pt reports nausea, will Rx Diclegis and Phenergan. Stable for discharge home.   Assessment and Plan   1. Early stage of pregnancy   2. Abdominal pain in pregnancy   3. Morning sickness    Discharge home Follow up at [place] to start care SAB precautions  Allergies as of 01/05/2019   No Known Allergies     Medication List    STOP taking these medications   Cholecalciferol 25 MCG (1000 UT) capsule   ketorolac 10 MG tablet Commonly known as: TORADOL   lidocaine 2 % solution Commonly known as: XYLOCAINE   metroNIDAZOLE 0.75 % vaginal gel Commonly known as: METROGEL   ondansetron 4 MG tablet Commonly known as: ZOFRAN   oseltamivir 75 MG capsule Commonly known as: TAMIFLU     TAKE these medications   Doxylamine-Pyridoxine 10-10 MG Tbec Take 2 tablets by mouth at bedtime. May also take 1 tab in am and 1 tab in afternoon   Prenatal Vitamin 27-0.8 MG Tabs Take 1 tablet by mouth daily.   promethazine 25 MG tablet Commonly known as: PHENERGAN Take 0.5-1 tablets (12.5-25 mg total) by mouth every 6 (six) hours as needed for nausea or vomiting.      Julianne Handler, CNM 01/05/2019, 10:05 PM

## 2019-01-05 NOTE — Progress Notes (Signed)
[place] CONSULT NOTE  Patient Care Team: Patient, No Pcp Per as PCP - General (General Practice)  CHIEF COMPLAINTS/PURPOSE OF CONSULTATION:   # 2014- low blood work [place]; No Bone marrow Biopsy]; 2020-July-absolute neutrophil count 1.1/hemoglobin 11.5/platelets normal.  Oncology History   No history exists.     HISTORY OF PRESENTING ILLNESS:  Kelli Soto 23 y.o.  female longstanding history of low blood counts has been referred to [place] for further evaluation recommendations.  Patient states that she had a similar work-up about 4 years ago at [facility] hematology department.  Patient did not have a bone marrow biopsy.  Patient denies any frequent infections.  Denies any pneumonias or hospitalizations or chronic diarrhea.  Otherwise appetite is good but no weight loss.   She notes to have a positive pregnancy recently.  Awaiting to see obstetrician.  Review of Systems  Constitutional: Negative for chills, diaphoresis, fever, malaise/fatigue and weight loss.  HENT: Negative for nosebleeds and sore throat.   Eyes: Negative for double vision.  Respiratory: Negative for cough, hemoptysis, sputum production, shortness of breath and wheezing.   Cardiovascular: Negative for chest pain, palpitations, orthopnea and leg swelling.  Gastrointestinal: Negative for abdominal pain, blood in stool, constipation, diarrhea, heartburn, melena, nausea and vomiting.  Genitourinary: Negative for dysuria, frequency and urgency.  Musculoskeletal: Negative for back pain and joint pain.  Skin: Negative.  Negative for itching and rash.  Neurological: Negative for dizziness, tingling, focal weakness, weakness and headaches.  Endo/Heme/Allergies: Does not bruise/bleed easily.  Psychiatric/Behavioral: Negative for depression. The patient is not nervous/anxious and does not have insomnia.      MEDICAL HISTORY:  Past Medical History:  Diagnosis Date  .  Bronchitis   . Chlamydia 02/19/2017  . Gonorrhea 2019  . Neutropenia ([place])     SURGICAL HISTORY: Past Surgical History:  Procedure Laterality Date  . NO PAST SURGERIES      SOCIAL HISTORY: Social History   Socioeconomic History  . Marital status: Single    Spouse name: Not on file  . Number of children: Not on file  . Years of education: Not on file  . Highest education level: Not on file  Occupational History  . Not on file  Social Needs  . Financial resource strain: Not on file  . Food insecurity    Worry: Not on file    Inability: Not on file  . Transportation needs    Medical: Not on file    Non-medical: Not on file  Tobacco Use  . Smoking status: Never Smoker  . Smokeless tobacco: Never Used  Substance and Sexual Activity  . Alcohol use: No  . Drug use: Not Currently    Types: Marijuana  . Sexual activity: Yes    Comment: pregnant  Lifestyle  . Physical activity    Days per week: Not on file    Minutes per session: Not on file  . Stress: Not on file  Relationships  . Social Herbalist on phone: Not on file    Gets together: Not on file    Attends religious service: Not on file    Active member of club or organization: Not on file    Attends meetings of clubs or organizations: Not on file    Relationship status: Not on file  . Intimate partner violence    Fear of current or ex partner: Not on file    Emotionally abused: Not on file  Physically abused: Not on file    Forced sexual activity: Not on file  Other Topics Concern  . Not on file  Social History Narrative   Lives in [place]; daughter 3 years; no smoking; no alcohol; used to be med Designer, multimedia.     FAMILY HISTORY: Family History  Problem Relation Age of Onset  . Hypertension Mother   . Diabetes Other     ALLERGIES:  has No Known Allergies.  MEDICATIONS:  Current Outpatient Medications  Medication Sig Dispense Refill  . Doxylamine-Pyridoxine 10-10 MG TBEC Take 2 tablets by  mouth at bedtime. May also take 1 tab in am and 1 tab in afternoon 100 tablet 1  . ondansetron (ZOFRAN ODT) 4 MG disintegrating tablet Take 1 tablet (4 mg total) by mouth every 8 (eight) hours as needed for nausea or vomiting. 20 tablet 0  . Prenatal Vit-Fe Fumarate-FA (PRENATAL VITAMIN) 27-0.8 MG TABS Take 1 tablet by mouth daily. 30 tablet 8  . promethazine (PHENERGAN) 25 MG tablet Take 0.5-1 tablets (12.5-25 mg total) by mouth every 6 (six) hours as needed for nausea or vomiting. 30 tablet 0   No current facility-administered medications for this visit.       Marland Kitchen  PHYSICAL EXAMINATION: ECOG PERFORMANCE STATUS: 0 - Asymptomatic  Vitals:   01/05/19 1132  BP: 100/66  Pulse: (!) 59  Resp: 16  Temp: 97.8 F (36.6 C)   Filed Weights   01/05/19 1132  Weight: 121 lb 12.8 oz (55.2 kg)    Physical Exam  Constitutional: She is oriented to person, place, and time and well-developed, well-nourished, and in no distress.  HENT:  Head: Normocephalic and atraumatic.  Mouth/Throat: Oropharynx is clear and moist. No oropharyngeal exudate.  Eyes: Pupils are equal, round, and reactive to light.  Neck: Normal range of motion. Neck supple.  Cardiovascular: Normal rate and regular rhythm.  Pulmonary/Chest: Effort normal and breath sounds normal. No respiratory distress. She has no wheezes.  Abdominal: Soft. Bowel sounds are normal. She exhibits no distension and no mass. There is no abdominal tenderness. There is no rebound and no guarding.  Musculoskeletal: Normal range of motion.        General: No tenderness or edema.  Neurological: She is alert and oriented to person, place, and time.  Skin: Skin is warm.  Psychiatric: Affect normal.     LABORATORY DATA:  I have reviewed the data as listed Lab Results  Component Value Date   WBC 4.4 01/10/2019   HGB 11.9 (L) 01/10/2019   HCT 35.9 (L) 01/10/2019   MCV 90.2 01/10/2019   PLT 207 01/10/2019   Recent Labs    07/10/18 1226  12/11/18 1512 01/10/19 1645  NA 139 140 136  K 3.9 4.2 3.6  CL 108 103 104  CO2 26 22 20*  GLUCOSE 93 87 82  BUN '7 8 10  ' CREATININE 0.62 0.71 0.57  CALCIUM 9.2 9.4 9.4  GFRNONAA >60 121 >60  GFRAA >60 140 >60  PROT 6.9 6.9  --   ALBUMIN 4.0 4.5  --   AST 21 16  --   ALT 15 15  --   ALKPHOS 41 49  --   BILITOT 1.2 0.5  --   BILIDIR 0.2  --   --   IBILI 1.0*  --   --     RADIOGRAPHIC STUDIES: I have personally reviewed the radiological images as listed and agreed with the findings in the report. US Ob Less Than 14 Weeks  With Ob Transvaginal  Result Date: 01/05/2019 CLINICAL DATA:  23 year old pregnant female with abdominal pain. LMP: 11/28/2018 corresponding to an estimated gestational age of [redacted] weeks, 3 days. EXAM: OBSTETRIC <14 WK [place] AND TRANSVAGINAL OB US TECHNIQUE: Both transabdominal and transvaginal ultrasound examinations were performed for complete evaluation of the gestation as well as the maternal uterus, adnexal regions, and pelvic cul-de-sac. Transvaginal technique was performed to assess early pregnancy. COMPARISON:  None. FINDINGS: Intrauterine gestational sac: Single intrauterine gestational sac. Yolk sac:  Seen Embryo:  Not present MSD: 12 mm   5 w   6 d Subchorionic hemorrhage:  None visualized. Maternal uterus/adnexae: The maternal ovaries are unremarkable. Corpus luteum is noted in the left ovary. No significant free fluid within the pelvis. IMPRESSION: Single intrauterine gestational sac with an estimated gestational age of [redacted] weeks, 6 days. No fetal pole identified at this time. Follow-up with ultrasound in 7-11 days, or earlier if clinically indicated, recommended. Electronically Signed   By: Anner Crete M.D.   On: 01/05/2019 21:46    ASSESSMENT & PLAN:   Neutropenia ([place]) # Neutropenia/Leucopenia-longstanding intermittent.  Asymptomatic.  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  Also reviewed the labs/work-up done at [facility].   #Mild  anemia-check iron studies ferritin LDH.  Folic acid  # Pregnancy test- positive; awaiting evaluation gynecology.  # DISPOSITION; # labs today [cbc/LDH/iron studies/ferritin/ folate/b12/HIV] # Follow up as needed- Dr.B  Addendum: Iron saturation 12% ferritin is 33; hemoglobin 92.0 folic acid slightly low at 5.4.  Recommend iron pill 1 every other day over-the-counter/folic acid once a day.  MyChart message sent.  All questions were answered. The patient knows to call the clinic with any problems, questions or concerns.     Cammie Sickle, MD 01/16/2019 12:53 PM

## 2019-01-06 LAB — HIV ANTIBODY (ROUTINE TESTING W REFLEX): HIV Screen 4th Generation wRfx: NONREACTIVE

## 2019-01-07 LAB — GC/CHLAMYDIA PROBE AMP (~~LOC~~) NOT AT ARMC
Chlamydia: NEGATIVE
Neisseria Gonorrhea: NEGATIVE

## 2019-01-08 ENCOUNTER — Ambulatory Visit: Payer: Medicaid Other | Admitting: Obstetrics & Gynecology

## 2019-01-10 ENCOUNTER — Other Ambulatory Visit: Payer: Self-pay

## 2019-01-10 ENCOUNTER — Ambulatory Visit (HOSPITAL_COMMUNITY)
Admission: EM | Admit: 2019-01-10 | Discharge: 2019-01-10 | Disposition: A | Discharge status: Home / Self Care | Payer: Medicaid Other | Attending: Emergency Medicine | Admitting: Emergency Medicine

## 2019-01-10 ENCOUNTER — Encounter (HOSPITAL_COMMUNITY): Payer: Self-pay | Admitting: *Deleted

## 2019-01-10 DIAGNOSIS — Z3A01 Less than 8 weeks gestation of pregnancy: Secondary | ICD-10-CM

## 2019-01-10 DIAGNOSIS — R112 Nausea with vomiting, unspecified: Secondary | ICD-10-CM | POA: Diagnosis present

## 2019-01-10 LAB — BASIC METABOLIC PANEL
Anion gap: 12 (ref 5–15)
BUN: 10 mg/dL (ref 6–20)
CO2: 20 mmol/L — ABNORMAL LOW (ref 22–32)
Calcium: 9.4 mg/dL (ref 8.9–10.3)
Chloride: 104 mmol/L (ref 98–111)
Creatinine, Ser: 0.57 mg/dL (ref 0.44–1.00)
GFR calc Af Amer: >60 mL/min (ref >60)
GFR calc non Af Amer: >60 mL/min (ref >60)
Glucose, Bld: 82 mg/dL (ref 70–99)
Potassium: 3.6 mmol/L (ref 3.5–5.1)
Sodium: 136 mmol/L (ref 135–145)

## 2019-01-10 LAB — POCT URINALYSIS DIP (DEVICE)
Glucose, UA: NEGATIVE mg/dL
Hgb urine dipstick: NEGATIVE
Ketones, ur: 80 mg/dL — AB
Leukocytes,Ua: NEGATIVE
Nitrite: NEGATIVE
Protein, ur: 30 mg/dL — AB
Specific Gravity, Urine: >=1.03 (ref 1.005–1.030)
Urobilinogen, UA: 1 mg/dL (ref 0.0–1.0)
pH: 6 (ref 5.0–8.0)

## 2019-01-10 LAB — CBC
HCT: 35.9 % — ABNORMAL LOW (ref 36.0–46.0)
Hemoglobin: 11.9 g/dL — ABNORMAL LOW (ref 12.0–15.0)
MCH: 29.9 pg (ref 26.0–34.0)
MCHC: 33.1 g/dL (ref 30.0–36.0)
MCV: 90.2 fL (ref 80.0–100.0)
Platelets: 207 10*3/uL (ref 150–400)
RBC: 3.98 MIL/uL (ref 3.87–5.11)
RDW: 12 % (ref 11.5–15.5)
WBC: 4.4 10*3/uL (ref 4.0–10.5)
nRBC: 0 % (ref 0.0–0.2)

## 2019-01-10 MED ORDER — ONDANSETRON HCL 4 MG/2ML IJ SOLN
INTRAMUSCULAR | Status: AC
  Filled 2019-01-10: qty 2

## 2019-01-10 MED ORDER — SODIUM CHLORIDE 0.9 % IV BOLUS
1000.0000 mL | Freq: Once | INTRAVENOUS | Status: AC
  Administered 2019-01-10: 17:00:00 1000 mL via INTRAVENOUS

## 2019-01-10 MED ORDER — ONDANSETRON HCL 4 MG/2ML IJ SOLN
4.0000 mg | Freq: Once | INTRAMUSCULAR | Status: AC
  Administered 2019-01-10: 16:00:00 4 mg via INTRAMUSCULAR

## 2019-01-10 MED ORDER — ONDANSETRON 4 MG PO TBDP: 4.0000 mg | ORAL_TABLET | Freq: Three times a day (TID) | ORAL | 0 refills | Status: DC | PRN

## 2019-01-10 NOTE — Discharge Instructions (Signed)
One-half of the 25 mg Unisom sleep tablet over-the-counter tablet or two chewable 5 mg tablets can be used off-label as an antiemetic. In addition, pyridoxine 25 mg, also available over-the-counter, is taken three or four times per day;This is a reasonable, less expensive substitute for combination tablets. OR May use may use zofran if the above not helping  Please follow up if symptoms persisting, not tolerating food, symptoms worsening

## 2019-01-10 NOTE — ED Triage Notes (Addendum)
Pt states she is [redacted] wks pregnant.  Started vomiting yesterday and is unable to keep anything down.  Denies any fevers, diarrhea, or any other sxs.  Pt actively vomiting during triage. States had pregnancy confirmed at [facility] approx 5 days ago - states went there due to abd pains; states no ectopic pregnancy; "sac was found, but too early to see baby".  Was given Rx for promethazine - states unable to keep down.

## 2019-01-11 NOTE — ED Provider Notes (Signed)
[facility]    CSN: [id number] Arrival date & time: 01/10/19  1435      History   Chief Complaint Chief Complaint  Patient presents with  . Emesis    HPI Kelli Soto is a 23 y.o. female approximately [redacted] weeks pregnant presenting today for evaluation of nausea and vomiting.  Patient states that for the past 2 days she has had persistent nausea and vomiting has been unable to keep even fluids down.  She has had some associated abdominal soreness, but feels this is related to the vomiting.  She was seen at [facility] approximately 5 days ago and intrauterine sac was found.  She was given promethazine and Diclegis.  She was unable to pick up the Diclegis due to cost.  She has been taking promethazine without relief of symptoms.  She is felt increasingly fatigued and weak.  Denies fevers chills or body aches.  Denies any URI symptoms of cough congestion or sore throat.  Denies known exposure to COVID.  HPI  Past Medical History:  Diagnosis Date  . Bronchitis   . Chlamydia 02/19/2017  . Gonorrhea 2019  . Neutropenia [facility](HCC)     Patient Active Problem List   Diagnosis Date Noted  . Low serum vitamin D 12/29/2018  . Neutropenia (HCC) 12/29/2018    Past Surgical History:  Procedure Laterality Date  . NO PAST SURGERIES      OB History    Gravida  2   Para  1   Term  1   Preterm      AB      Living  1     SAB      TAB      Ectopic      Multiple  0   Live Births  1            Home Medications    Prior to Admission medications   Medication Sig Start Date End Date Taking? Authorizing Provider  promethazine (PHENERGAN) 25 MG tablet Take 0.5-1 tablets (12.5-25 mg total) by mouth every 6 (six) hours as needed for nausea or vomiting. 01/05/19  Yes Donette LarryBhambri, Melanie, CNM  Doxylamine-Pyridoxine 10-10 MG TBEC Take 2 tablets by mouth at bedtime. May also take 1 tab in am and 1 tab in afternoon 01/05/19   Donette LarryBhambri, Melanie, CNM  ondansetron (ZOFRAN  ODT) 4 MG disintegrating tablet Take 1 tablet (4 mg total) by mouth every 8 (eight) hours as needed for nausea or vomiting. 01/10/19   Gatlin Kittell C, PA-C  Prenatal Vit-Fe Fumarate-FA (PRENATAL VITAMIN) 27-0.8 MG TABS Take 1 tablet by mouth daily. 01/05/19   Donette LarryBhambri, Melanie, CNM    Family History Family History  Problem Relation Age of Onset  . Hypertension Mother   . Diabetes Other     Social History Social History   Tobacco Use  . Smoking status: Never Smoker  . Smokeless tobacco: Never Used  Substance Use Topics  . Alcohol use: No  . Drug use: Not Currently    Types: Marijuana     Allergies   Patient has no known allergies.   Review of Systems Review of Systems  Constitutional: Negative for fever.  Respiratory: Negative for shortness of breath.   Cardiovascular: Negative for chest pain.  Gastrointestinal: Positive for nausea and vomiting. Negative for abdominal pain and diarrhea.  Genitourinary: Negative for dysuria, flank pain, genital sores, hematuria, menstrual problem, vaginal bleeding, vaginal discharge and vaginal pain.  Musculoskeletal: Negative for back pain.  Skin: Negative for rash.  Neurological: Negative for dizziness, light-headedness and headaches.     Physical Exam Triage Vital Signs ED Triage Vitals  Enc Vitals Group     BP 01/10/19 1550 (!) 113/52     Pulse Rate 01/10/19 1550 (!) 114     Resp --      Temp 01/10/19 1550 99.1 F (37.3 C)     Temp Source 01/10/19 1550 Oral     SpO2 01/10/19 1550 98 %     Weight --      Height --      Head Circumference --      Peak Flow --      Pain Score 01/10/19 1554 0     Pain Loc --      Pain Edu? --      Excl. in GC? --    No data found.  Updated Vital Signs BP (!) 113/52   Pulse 64 Comment: while at rest, not actively vomiting  Temp 99.1 F (37.3 C) (Oral)   LMP 11/28/2018 (Exact Date)   SpO2 98%   Visual Acuity Right Eye Distance:   Left Eye Distance:   Bilateral Distance:    Right  Eye Near:   Left Eye Near:    Bilateral Near:     Physical Exam Vitals signs and nursing note reviewed.  Constitutional:      General: She is not in acute distress.    Appearance: She is well-developed.  HENT:     Head: Normocephalic and atraumatic.     Mouth/Throat:     Comments: Oral mucosa pink and moist, no tonsillar enlargement or exudate. Posterior pharynx patent and nonerythematous, no uvula deviation or swelling. Normal phonation. Eyes:     Conjunctiva/sclera: Conjunctivae normal.  Neck:     Musculoskeletal: Neck supple.  Cardiovascular:     Rate and Rhythm: Normal rate and regular rhythm.     Heart sounds: No murmur.  Pulmonary:     Effort: Pulmonary effort is normal. No respiratory distress.     Breath sounds: Normal breath sounds.  Abdominal:     Palpations: Abdomen is soft.     Tenderness: There is no abdominal tenderness.     Comments: Soft, nondistended, generalized tenderness throughout abdomen, no focal tenderness, negative rebound, negative Rovsing, negative Murphy's, negative McBurney's  Skin:    General: Skin is warm and dry.  Neurological:     Mental Status: She is alert.      UC Treatments / Results  Labs (all labs ordered are listed, but only abnormal results are displayed) Labs Reviewed  BASIC METABOLIC PANEL - Abnormal; Notable for the following components:      Result Value   CO2 20 (*)    All other components within normal limits  CBC - Abnormal; Notable for the following components:   Hemoglobin 11.9 (*)    HCT 35.9 (*)    All other components within normal limits  POCT URINALYSIS DIP (DEVICE) - Abnormal; Notable for the following components:   Bilirubin Urine SMALL (*)    Ketones, ur 80 (*)    Protein, ur 30 (*)    All other components within normal limits    EKG   Radiology No results found.  Procedures Procedures (including critical care time)  Medications Ordered in UC Medications  ondansetron (ZOFRAN) injection 4 mg (4  mg Intramuscular Given 01/10/19 1609)  sodium chloride 0.9 % bolus 1,000 mL (1,000 mLs Intravenous New Bag/Given 01/10/19 1721)  ondansetron (  ZOFRAN) 4 MG/2ML injection (has no administration in time range)    Initial Impression / Assessment and Plan / UC Course  I have reviewed the triage vital signs and the nursing notes.  Pertinent labs & imaging results that were available during my care of the patient were reviewed by me and considered in my medical decision making (see chart for details).     Patient initially tachycardic while vomiting, heart rate improved.  Patient given 4 mg IM Zofran.  Nausea improved, was only able to tolerate a small amount of liquids.  Opted to go ahead and provide patient with 1 L fluids.  UA suggestive slight dehydration.  Discussed OTC medicines to use as alternative to diclegis, did provide Zofran to use if it is not helping.  Continue to monitor, do not suspect underlying abdominal emergency at this time.Discussed strict return precautions. Patient verbalized understanding and is agreeable with plan.  Final Clinical Impressions(s) / UC Diagnoses   Final diagnoses:  Nausea and vomiting, intractability of vomiting not specified, unspecified vomiting type     Discharge Instructions     One-half of the 25 mg Unisom sleep tablet over-the-counter tablet or two chewable 5 mg tablets can be used off-label as an antiemetic. In addition, pyridoxine 25 mg, also available over-the-counter, is taken three or four times per day;This is a reasonable, less expensive substitute for combination tablets. OR May use may use zofran if the above not helping  Please follow up if symptoms persisting, not tolerating food, symptoms worsening   ED Prescriptions    Medication Sig Dispense Auth. Provider   ondansetron (ZOFRAN ODT) 4 MG disintegrating tablet Take 1 tablet (4 mg total) by mouth every 8 (eight) hours as needed for nausea or vomiting. 20 tablet Marybeth Dandy, [place] C, PA-C      Controlled Substance Prescriptions [place] Controlled Substance Registry consulted? Not Applicable   Janith Lima, [place] 01/11/19 1013

## 2019-01-12 ENCOUNTER — Telehealth (HOSPITAL_COMMUNITY): Payer: Self-pay | Admitting: Emergency Medicine

## 2019-01-12 NOTE — Telephone Encounter (Signed)
Normal labs, pt contacted and made aware, all questions answered.

## 2019-01-20 ENCOUNTER — Telehealth: Payer: Self-pay

## 2019-01-20 NOTE — Telephone Encounter (Signed)
Received a call from patient stating she has been having diarrhea and vomiting for a couple of days. She reports being hot but has not taken temp. I have advised her to try to BRAT diet and drink small sips of ginger ale,sprite to see if she can tolerate this. Advised if the vomiting and diarrhea get worse she should report to the [facility] unit to be check out. Patient voice understanding at this time.

## 2019-01-29 ENCOUNTER — Other Ambulatory Visit: Payer: Self-pay | Admitting: *Deleted

## 2019-01-29 MED ORDER — ONDANSETRON 4 MG PO TBDP: 4.0000 mg | ORAL_TABLET | Freq: Three times a day (TID) | ORAL | 0 refills | Status: DC | PRN

## 2019-02-09 NOTE — Progress Notes (Signed)
Pap smear -12/11/2018 (ASC-US).Abnormal   Medical home form Requesting refill on Zofran  Decline flu

## 2019-02-10 ENCOUNTER — Ambulatory Visit (INDEPENDENT_AMBULATORY_CARE_PROVIDER_SITE_OTHER): Payer: Medicaid Other | Admitting: Obstetrics and Gynecology

## 2019-02-10 ENCOUNTER — Other Ambulatory Visit (HOSPITAL_COMMUNITY)
Admission: RE | Admit: 2019-02-10 | Discharge: 2019-02-10 | Disposition: A | Discharge status: Home / Self Care | Payer: Medicaid Other | Source: Ambulatory Visit | Attending: Obstetrics and Gynecology | Admitting: Obstetrics and Gynecology

## 2019-02-10 ENCOUNTER — Other Ambulatory Visit: Payer: Self-pay

## 2019-02-10 ENCOUNTER — Encounter: Payer: Self-pay | Admitting: Obstetrics and Gynecology

## 2019-02-10 VITALS — BP 96/58 | HR 63 | Wt 124.4 lb

## 2019-02-10 DIAGNOSIS — O30001 Twin pregnancy, unspecified number of placenta and unspecified number of amniotic sacs, first trimester: Secondary | ICD-10-CM | POA: Diagnosis not present

## 2019-02-10 DIAGNOSIS — O30039 Twin pregnancy, monochorionic/diamniotic, unspecified trimester: Secondary | ICD-10-CM | POA: Insufficient documentation

## 2019-02-10 DIAGNOSIS — E611 Iron deficiency: Secondary | ICD-10-CM | POA: Insufficient documentation

## 2019-02-10 DIAGNOSIS — Z3A10 10 weeks gestation of pregnancy: Secondary | ICD-10-CM | POA: Diagnosis not present

## 2019-02-10 DIAGNOSIS — O0990 Supervision of high risk pregnancy, unspecified, unspecified trimester: Secondary | ICD-10-CM | POA: Insufficient documentation

## 2019-02-10 DIAGNOSIS — R7989 Other specified abnormal findings of blood chemistry: Secondary | ICD-10-CM

## 2019-02-10 DIAGNOSIS — D708 Other neutropenia: Secondary | ICD-10-CM

## 2019-02-10 DIAGNOSIS — O0991 Supervision of high risk pregnancy, unspecified, first trimester: Secondary | ICD-10-CM

## 2019-02-10 DIAGNOSIS — Z87898 Personal history of other specified conditions: Secondary | ICD-10-CM

## 2019-02-10 MED ORDER — FUSION PLUS PO CAPS: 1.0000 | ORAL_CAPSULE | Freq: Every day | ORAL | 3 refills | Status: DC

## 2019-02-10 MED ORDER — POLYETHYLENE GLYCOL 3350 17 G PO PACK: 17.0000 g | PACK | Freq: Two times a day (BID) | ORAL | 1 refills | Status: DC | PRN

## 2019-02-10 MED ORDER — FOLIC ACID 1 MG PO TABS: 1.0000 mg | ORAL_TABLET | Freq: Every day | ORAL | 3 refills | Status: DC

## 2019-02-10 MED ORDER — ONDANSETRON 4 MG PO TBDP: 4.0000 mg | ORAL_TABLET | Freq: Three times a day (TID) | ORAL | 1 refills | Status: DC | PRN

## 2019-02-10 MED ORDER — DOXYLAMINE-PYRIDOXINE 10-10 MG PO TBEC: 2.0000 | DELAYED_RELEASE_TABLET | Freq: Every day | ORAL | 1 refills | Status: DC

## 2019-02-10 NOTE — Progress Notes (Signed)
New OB Note  02/10/2019   Clinic: [facility]  Chief Complaint: NOB  Transfer of Care Patient: no  History of Present Illness: Ms. Kelli Soto is a 23 y.o. G2P1001 @ 10/4 weeks (EDC 3/19, based on Patient's last menstrual period was 11/28/2018 (exact date).=5wk u/s).  Preg complicated by has Low serum vitamin D; Neutropenia (HCC); Low iron; Supervision of high risk pregnancy, antepartum; History of poor fetal growth; and Twin gestation in first trimester on their problem list.   Any events prior to today's visit: no She has Positive signs or symptoms of nausea/vomiting of pregnancy. Helped with zofran, phenergan didn't work She has Negative signs or symptoms of miscarriage or preterm labor  ROS: A 12-point review of systems was performed and negative, except as stated in the above HPI.  OBGYN History: As per HPI. OB History  Gravida Para Term Preterm AB Living  2 1 1     1   SAB TAB Ectopic Multiple Live Births        0 1    # Outcome Date GA Lbr Len/2nd Weight Sex Delivery Anes PTL Lv  2 Current           1 Term 05/13/15 2775w4d 10:55 / 00:27 5 lb 6.4 oz (2.45 kg) F Vag-Spont EPI  LIV     Birth Comments: none    Any issues with any prior pregnancies: borderline FGR Prior children are healthy, doing well, and without any problems or issues: yes History of pap smears: Yes. Last pap smear 2019 and results were negative  Past Medical History: Past Medical History:  Diagnosis Date  . Bronchitis   . Chlamydia 02/19/2017  . Gonorrhea 2019  . Neutropenia [facility](HCC)     Past Surgical History: Past Surgical History:  Procedure Laterality Date  . NO PAST SURGERIES      Family History:  Family History  Problem Relation Age of Onset  . Hypertension Mother   . Diabetes Other     Social History:  Social History   Socioeconomic History  . Marital status: Single    Spouse name: Not on file  . Number of children: Not on file  . Years of education: Not on file  .  Highest education level: Not on file  Occupational History  . Not on file  Social Needs  . Financial resource strain: Not on file  . Food insecurity    Worry: Not on file    Inability: Not on file  . Transportation needs    Medical: Not on file    Non-medical: Not on file  Tobacco Use  . Smoking status: Never Smoker  . Smokeless tobacco: Never Used  Substance and Sexual Activity  . Alcohol use: No  . Drug use: Not Currently    Types: Marijuana  . Sexual activity: Yes    Comment: pregnant  Lifestyle  . Physical activity    Days per week: Not on file    Minutes per session: Not on file  . Stress: Not on file  Relationships  . Social Musicianconnections    Talks on phone: Not on file    Gets together: Not on file    Attends religious service: Not on file    Active member of club or organization: Not on file    Attends meetings of clubs or organizations: Not on file    Relationship status: Not on file  . Intimate partner violence    Fear of current or ex partner: Not on  file    Emotionally abused: Not on file    Physically abused: Not on file    Forced sexual activity: Not on file  Other Topics Concern  . Not on file  Social History Narrative   Lives in [place]; daughter 3 years; no smoking; no alcohol; used to be med Designer, multimedia.      Allergy: No Known Allergies  Health Maintenance:  Mammogram Up to Date: not applicable  Current Outpatient Medications: PNV, zofran  Physical Exam:   BP (!) 96/58   Pulse 63   Wt 124 lb 6.4 oz (56.4 kg)   LMP 11/28/2018 (Exact Date)   BMI 22.04 kg/m  Body mass index is 22.04 kg/m.    Marland Kitchen Fundal height: not applicable FHTs: 696E/952W  General appearance: Well nourished, well developed female in no acute distress.  Cardiovascular: S1, S2 normal, no murmur, rub or gallop, regular rate and rhythm Respiratory:  Clear to auscultation bilateral. Normal respiratory effort Abdomen: positive bowel sounds and no masses, hernias; diffusely  non tender to palpation, non distended Breasts: patient denies any breast s/s. Neuro/Psych:  Normal mood and affect.  Skin:  Warm and dry.   Laboratory: none  Imaging:  Bedside u/s: twin pregnancy (A: 10/6wks, 150s; B: 10/4, 160s), appears di-di  Assessment: pt doing well  Plan: 1. Supervision of high risk pregnancy, antepartum Routine care. Panorama today. Fusion plus and extra FA sent in. Pt amenable to trying diclegis with prn zofran. Constipation likely from zofran. Once weans off it, it should get better; miralax sent in - Obstetric Panel, Including HIV - Urine Culture - GC/Chlamydia probe amp ([place])not at [place] - VITAMIN D 25 Hydroxy (Vit-D Deficiency, Fractures) - [place] MFM OB LIMITED; Future  2. Low serum vitamin D - VITAMIN D 25 Hydroxy (Vit-D Deficiency, Fractures)  3. Low iron See above  4. History of poor fetal growth Will getting serial growths due to twins  5. Twin gestation in first trimester, unspecified multiple gestation type Will set up for mfm limited  Problem list reviewed and updated.  Follow up in 3 weeks.  The nature of [facility] with multiple MDs and other Advanced Practice Providers was explained to patient; also emphasized that residents, students are part of our team.  >50% of 25 min visit spent on counseling and coordination of care.     Durene Romans MD Attending Center for [facility])

## 2019-02-10 NOTE — Patient Instructions (Signed)
Your Due Date is March 19th Start taking the new prenatal vitamin sent in Start taking the new folic acid sent in

## 2019-02-11 ENCOUNTER — Other Ambulatory Visit: Payer: Self-pay | Admitting: Obstetrics and Gynecology

## 2019-02-11 LAB — OBSTETRIC PANEL, INCLUDING HIV
Antibody Screen: NEGATIVE
Basophils Absolute: 0 10*3/uL (ref 0.0–0.2)
Basos: 0 %
EOS (ABSOLUTE): 0.1 10*3/uL (ref 0.0–0.4)
Eos: 1 %
HIV Screen 4th Generation wRfx: NONREACTIVE
Hematocrit: 38.3 % (ref 34.0–46.6)
Hemoglobin: 12.4 g/dL (ref 11.1–15.9)
Hepatitis B Surface Ag: NEGATIVE
Immature Grans (Abs): 0 10*3/uL (ref 0.0–0.1)
Immature Granulocytes: 0 %
Lymphocytes Absolute: 1.8 10*3/uL (ref 0.7–3.1)
Lymphs: 36 %
MCH: 30.1 pg (ref 26.6–33.0)
MCHC: 32.4 g/dL (ref 31.5–35.7)
MCV: 93 fL (ref 79–97)
Monocytes Absolute: 0.3 10*3/uL (ref 0.1–0.9)
Monocytes: 5 %
Neutrophils Absolute: 2.8 10*3/uL (ref 1.4–7.0)
Neutrophils: 58 %
Platelets: 238 10*3/uL (ref 150–450)
RBC: 4.12 x10E6/uL (ref 3.77–5.28)
RDW: 12.2 % (ref 11.7–15.4)
RPR Ser Ql: NONREACTIVE
Rh Factor: POSITIVE
Rubella Antibodies, IGG: <0.9 index — ABNORMAL LOW (ref >0.99)
WBC: 4.9 10*3/uL (ref 3.4–10.8)

## 2019-02-11 LAB — VITAMIN D 25 HYDROXY (VIT D DEFICIENCY, FRACTURES): Vit D, 25-Hydroxy: 26.8 ng/mL — ABNORMAL LOW (ref 30.0–100.0)

## 2019-02-11 LAB — URINE CULTURE: Organism ID, Bacteria: NO GROWTH

## 2019-02-11 LAB — GC/CHLAMYDIA PROBE AMP (~~LOC~~) NOT AT ARMC
Chlamydia: NEGATIVE
Neisseria Gonorrhea: NEGATIVE

## 2019-02-11 LAB — HEMOGLOBIN A1C
Est. average glucose Bld gHb Est-mCnc: 108 mg/dL
Hgb A1c MFr Bld: 5.4 % (ref 4.8–5.6)

## 2019-02-11 MED ORDER — VITAMIN D 50 MCG (2000 UT) PO TABS: 2000.0000 [IU] | ORAL_TABLET | Freq: Every day | ORAL | 6 refills | Status: DC

## 2019-02-13 ENCOUNTER — Inpatient Hospital Stay (HOSPITAL_COMMUNITY)
Admission: AD | Admit: 2019-02-13 | Discharge: 2019-02-13 | Disposition: A | Discharge status: Home / Self Care | Payer: Medicaid Other | Attending: Obstetrics and Gynecology | Admitting: Obstetrics and Gynecology

## 2019-02-13 ENCOUNTER — Encounter (HOSPITAL_COMMUNITY): Payer: Self-pay | Admitting: *Deleted

## 2019-02-13 ENCOUNTER — Other Ambulatory Visit: Payer: Self-pay

## 2019-02-13 DIAGNOSIS — O219 Vomiting of pregnancy, unspecified: Secondary | ICD-10-CM | POA: Diagnosis not present

## 2019-02-13 DIAGNOSIS — Z3A11 11 weeks gestation of pregnancy: Secondary | ICD-10-CM | POA: Diagnosis not present

## 2019-02-13 DIAGNOSIS — O26891 Other specified pregnancy related conditions, first trimester: Secondary | ICD-10-CM | POA: Insufficient documentation

## 2019-02-13 DIAGNOSIS — O210 Mild hyperemesis gravidarum: Secondary | ICD-10-CM | POA: Insufficient documentation

## 2019-02-13 DIAGNOSIS — K5903 Drug induced constipation: Secondary | ICD-10-CM | POA: Insufficient documentation

## 2019-02-13 LAB — URINALYSIS, ROUTINE W REFLEX MICROSCOPIC
Bacteria, UA: NONE SEEN
Bilirubin Urine: NEGATIVE
Glucose, UA: NEGATIVE mg/dL
Hgb urine dipstick: NEGATIVE
Ketones, ur: 80 mg/dL — AB
Leukocytes,Ua: NEGATIVE
Nitrite: NEGATIVE
Protein, ur: 30 mg/dL — AB
Specific Gravity, Urine: 1.029 (ref 1.005–1.030)
pH: 5 (ref 5.0–8.0)

## 2019-02-13 MED ORDER — METOCLOPRAMIDE HCL 10 MG PO TABS
10.0000 mg | ORAL_TABLET | Freq: Once | ORAL | Status: AC
  Administered 2019-02-13: 10 mg via ORAL
  Filled 2019-02-13: qty 1

## 2019-02-13 MED ORDER — METOCLOPRAMIDE HCL 10 MG PO TABS: 10.0000 mg | ORAL_TABLET | Freq: Four times a day (QID) | ORAL | 2 refills | Status: DC

## 2019-02-13 MED ORDER — DOCUSATE SODIUM 100 MG PO CAPS: 100.0000 mg | ORAL_CAPSULE | Freq: Two times a day (BID) | ORAL | 0 refills | Status: DC | PRN

## 2019-02-13 NOTE — Progress Notes (Signed)
SS enema administered by this RN.  Pt able to tolerate approx 700cc, unable to hold enema for long.  Held for approx 1 minutes prior to going to restroom.

## 2019-02-13 NOTE — Progress Notes (Signed)
Pt reports was able to pass some stool, reports "a little relief", states still feels like she needs to go.

## 2019-02-13 NOTE — MAU Provider Note (Addendum)
History     CSN: [id number]  Arrival date and time: 02/13/19 1023   First Provider Initiated Contact with Patient 02/13/19 1124      Chief Complaint  Patient presents with  . Constipation   Kelli Soto is a 23 y.o. 432P1001 F who is 5338w0d by LMP presenting with constipation. She states she has felt constipated for a month since she started taking zofran for nausea. Last bowel movement was 2 weeks ago and she describes it as "hard pellets." Has been having smaller bowel movements with 1-2 hard small pellets at a time since then, most recently yesterday. Of note, she saw her OBGYN 3 days ago and her nausea regiment was switched from zofran to diclegis with PRN zofran due to her constipation. She has been taking the diclegis since then but does not feel that it is helping her nausea/vomiting. Has not taken zofran for the past 3 days. She reports onset of associated "pressure" in her butt and lower belly yesterday morning. Pressure got "really bad" last night, so she decided to come to MAU today. She was also given Miralax by her OBGYN for the constipation but states she was not able to keep it down due to nausea and vomiting. She has been able to keep food down when she eats, but has been avoiding eating due to fear of vomiting from nausea. She also reports she has passing more gas recently.  Denies cramping. She does report yellow/green vaginal discharge for the past 10 days and tried using her BV cream for this. Had vaginal itching but states this resolved a week ago after using her BV cream.  Denies vaginal bleeding, fever, chills, or urinary symptoms.    OB History    Gravida  2   Para  1   Term  1   Preterm      AB      Living  1     SAB      TAB      Ectopic      Multiple  0   Live Births  1           Past Medical History:  Diagnosis Date  . Bronchitis   . Chlamydia 02/19/2017  . Gonorrhea 2019  . Neutropenia [facility](HCC)     Past Surgical History:  Procedure  Laterality Date  . NO PAST SURGERIES      Family History  Problem Relation Age of Onset  . Hypertension Mother   . Diabetes Other     Social History   Tobacco Use  . Smoking status: Never Smoker  . Smokeless tobacco: Never Used  Substance Use Topics  . Alcohol use: No  . Drug use: Not Currently    Types: Marijuana    Comment: last smoked 2 weeks from August 28th    Allergies: No Known Allergies  Medications Prior to Admission  Medication Sig Dispense Refill Last Dose  . Cholecalciferol (VITAMIN D) 50 MCG (2000 UT) tablet Take 1 tablet (2,000 Units total) by mouth daily. 60 tablet 6 Past Week at Unknown time  . polyethylene glycol (MIRALAX / GLYCOLAX) 17 g packet Take 17 g by mouth 2 (two) times daily as needed (constipation). 30 each 1 02/12/2019 at 1400  . Doxylamine-Pyridoxine 10-10 MG TBEC Take 2 tablets by mouth at bedtime. May also take 1 tab in am and 1 tab in afternoon 100 tablet 1   . folic acid (FOLVITE) 1 MG tablet Take 1 tablet (1 mg total)  by mouth daily. 60 tablet 3   . Iron-FA-B Cmp-C-Biot-Probiotic (FUSION PLUS) CAPS Take 1 tablet by mouth daily. 60 capsule 3   . ondansetron (ZOFRAN ODT) 4 MG disintegrating tablet Take 1 tablet (4 mg total) by mouth every 8 (eight) hours as needed for nausea or vomiting. 20 tablet 1 02/09/2019  . promethazine (PHENERGAN) 25 MG tablet Take 0.5-1 tablets (12.5-25 mg total) by mouth every 6 (six) hours as needed for nausea or vomiting. (Patient not taking: Reported on 02/10/2019) 30 tablet 0     Review of Systems  Constitutional: Negative for appetite change, chills and fever.  Gastrointestinal: Positive for abdominal pain, constipation, nausea and vomiting. Negative for blood in stool.  Genitourinary: Positive for vaginal discharge (green/yellow). Negative for difficulty urinating, dysuria, frequency, urgency and vaginal bleeding.   Physical Exam   Blood pressure 112/89, pulse 71, temperature 98.3 F (36.8 C), temperature source  Oral, resp. rate 20, height 5\' 4"  (1.626 m), weight 56.1 kg, last menstrual period 11/28/2018, SpO2 100 %.  Physical Exam  Constitutional: She is oriented to person, place, and time. She appears well-developed and well-nourished. No distress.  Cardiovascular: Normal rate, regular rhythm, normal heart sounds and intact distal pulses.  No murmur heard. Respiratory: Effort normal. No respiratory distress.  GI: Soft. Bowel sounds are normal. She exhibits no distension and no mass. There is no abdominal tenderness. There is no rebound and no guarding.  Musculoskeletal:        General: No edema.  Neurological: She is alert and oriented to person, place, and time.  Skin: She is not diaphoretic.    MAU Course  Procedures  MDM  UA: pending  Reglan given for nausea with improvement. Soap suds enema given with improvement. No evidence of acute abdomen. Constipation is likely due to zofran.  Assessment and Plan   1. Nausea/vomiting in pregnancy   2. Drug-induced constipation   3. [redacted] weeks gestation of pregnancy    Discharged home in stable condition. F/u with OBGYN as scheduled. Rx for reglan and colace given.  Told to continue trying Miralax. Advised to hold off on iron rx given Hgb 11.9 on 7/25 CBC and side effect of constipation. SAB return precautions given.  Jones Broom 02/13/2019, 11:40 AM   I confirm that I have verified the information documented in the medical student's note and that I have also personally reperformed the history, physical exam and all medical decision making activities of this service and have verified that all service and findings are accurately documented in this student's note.   Limited bedside US done. RN was unable to discern to 2 fetal heart rates with doppler.  Pt informed that the ultrasound is considered a limited OB ultrasound and is not intended to be a complete ultrasound exam.  Patient also informed that the ultrasound is not being completed  with the intent of assessing for fetal or placental anomalies or any pelvic abnormalities.  Explained that the purpose of today's ultrasound is to assess for  viability.  Patient acknowledges the purpose of the exam and the limitations of the study.   +FHT 150 +FHT [place], [place]  02/13/19  3:34 PM

## 2019-02-13 NOTE — Discharge Instructions (Signed)

## 2019-02-13 NOTE — MAU Note (Signed)
Presents c/o constipation.  States unsure when last "good" BM.  States causing lower abdominal pain. Took Miralax, but unable to keep down. Denies VB or LOF.

## 2019-02-16 ENCOUNTER — Encounter: Payer: Self-pay | Admitting: Radiology

## 2019-02-16 ENCOUNTER — Telehealth: Payer: Self-pay | Admitting: Radiology

## 2019-02-16 NOTE — Telephone Encounter (Signed)
Patient informed about Charlton Haws results

## 2019-02-25 ENCOUNTER — Other Ambulatory Visit: Payer: Self-pay

## 2019-02-25 ENCOUNTER — Other Ambulatory Visit (HOSPITAL_COMMUNITY): Payer: Self-pay | Admitting: *Deleted

## 2019-02-25 ENCOUNTER — Ambulatory Visit (HOSPITAL_COMMUNITY)
Admission: RE | Admit: 2019-02-25 | Discharge: 2019-02-25 | Disposition: A | Discharge status: Home / Self Care | Payer: Medicaid Other | Source: Ambulatory Visit | Attending: Obstetrics and Gynecology | Admitting: Obstetrics and Gynecology

## 2019-02-25 ENCOUNTER — Encounter (HOSPITAL_COMMUNITY): Payer: Self-pay

## 2019-02-25 ENCOUNTER — Other Ambulatory Visit: Payer: Self-pay | Admitting: Obstetrics and Gynecology

## 2019-02-25 ENCOUNTER — Ambulatory Visit (HOSPITAL_COMMUNITY): Payer: Medicaid Other | Admitting: *Deleted

## 2019-02-25 VITALS — BP 104/53 | HR 71 | Temp 98.3°F

## 2019-02-25 DIAGNOSIS — O30031 Twin pregnancy, monochorionic/diamniotic, first trimester: Secondary | ICD-10-CM | POA: Diagnosis not present

## 2019-02-25 DIAGNOSIS — O30091 Twin pregnancy, unable to determine number of placenta and number of amniotic sacs, first trimester: Secondary | ICD-10-CM | POA: Insufficient documentation

## 2019-02-25 DIAGNOSIS — O0990 Supervision of high risk pregnancy, unspecified, unspecified trimester: Secondary | ICD-10-CM | POA: Diagnosis not present

## 2019-02-25 DIAGNOSIS — O30039 Twin pregnancy, monochorionic/diamniotic, unspecified trimester: Secondary | ICD-10-CM

## 2019-02-25 DIAGNOSIS — Z3A12 12 weeks gestation of pregnancy: Secondary | ICD-10-CM | POA: Diagnosis not present

## 2019-02-25 DIAGNOSIS — O09291 Supervision of pregnancy with other poor reproductive or obstetric history, first trimester: Secondary | ICD-10-CM

## 2019-02-26 ENCOUNTER — Encounter: Payer: Self-pay | Admitting: Obstetrics and Gynecology

## 2019-02-26 ENCOUNTER — Other Ambulatory Visit: Payer: Self-pay | Admitting: Obstetrics and Gynecology

## 2019-03-03 ENCOUNTER — Ambulatory Visit (INDEPENDENT_AMBULATORY_CARE_PROVIDER_SITE_OTHER): Payer: Medicaid Other | Admitting: Obstetrics and Gynecology

## 2019-03-03 ENCOUNTER — Other Ambulatory Visit: Payer: Self-pay

## 2019-03-03 VITALS — BP 109/72 | HR 71 | Wt 129.4 lb

## 2019-03-03 DIAGNOSIS — O30031 Twin pregnancy, monochorionic/diamniotic, first trimester: Secondary | ICD-10-CM

## 2019-03-03 DIAGNOSIS — O0991 Supervision of high risk pregnancy, unspecified, first trimester: Secondary | ICD-10-CM

## 2019-03-03 DIAGNOSIS — Z3A13 13 weeks gestation of pregnancy: Secondary | ICD-10-CM

## 2019-03-03 DIAGNOSIS — O0990 Supervision of high risk pregnancy, unspecified, unspecified trimester: Secondary | ICD-10-CM

## 2019-03-03 MED ORDER — ASPIRIN EC 81 MG PO TBEC: 81.0000 mg | DELAYED_RELEASE_TABLET | Freq: Every day | ORAL | 2 refills | Status: DC

## 2019-03-03 NOTE — Progress Notes (Signed)
Prenatal Visit Note Date: 03/03/2019 Clinic: [facility]  Subjective:  Chalise Pe Tiedt is a 23 y.o. G2P1001 at [redacted]w[redacted]d being seen today for ongoing prenatal care.  She is currently monitored for the following issues for this high-risk pregnancy and has Low serum vitamin D; Neutropenia ([place]); Low iron; Supervision of high risk pregnancy, antepartum; History of poor fetal growth; and Twin gestation, monochorionic diamniotic on their problem list.  Patient reports fatigue.   Contractions: Not present. Vag. Bleeding: None.  Movement: Absent. Denies leaking of fluid.   The following portions of the patient's history were reviewed and updated as appropriate: allergies, current medications, past family history, past medical history, past social history, past surgical history and problem list. Problem list updated.  Objective:   Vitals:   03/03/19 1106  BP: 109/72  Pulse: 71  Weight: 129 lb 6.4 oz (58.7 kg)    Fetal Status: Fetal Heart Rate (bpm): 136/142   Movement: Absent     General:  Alert, oriented and cooperative. Patient is in no acute distress.  Skin: Skin is warm and dry. No rash noted.   Cardiovascular: Normal heart rate noted  Respiratory: Normal respiratory effort, no problems with respiration noted  Abdomen: Soft, gravid, appropriate for gestational age. Pain/Pressure: Absent     Pelvic:  Cervical exam deferred        Extremities: Normal range of motion.     Mental Status: Normal mood and affect. Normal behavior. Normal judgment and thought content.   Urinalysis:      Assessment and Plan:  Pregnancy: G2P1001 at [redacted]w[redacted]d  1. Supervision of high risk pregnancy, antepartum Routine care Offer afp nv  2. 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 Please refer to After Visit Summary for other counseling recommendations.  Return in about 1 month (around 04/02/2019) for high risk, in person.   Karalina Halim, MD

## 2019-03-03 NOTE — Progress Notes (Signed)
Patient reported some light headness  for the patient couple of days.

## 2019-03-18 ENCOUNTER — Encounter (HOSPITAL_COMMUNITY): Payer: Self-pay

## 2019-03-18 ENCOUNTER — Ambulatory Visit (HOSPITAL_COMMUNITY)
Admission: RE | Admit: 2019-03-18 | Discharge: 2019-03-18 | Disposition: A | Discharge status: Home / Self Care | Payer: Medicaid Other | Source: Ambulatory Visit | Attending: Obstetrics and Gynecology | Admitting: Obstetrics and Gynecology

## 2019-03-18 ENCOUNTER — Other Ambulatory Visit: Payer: Self-pay

## 2019-03-18 ENCOUNTER — Ambulatory Visit (HOSPITAL_COMMUNITY): Payer: Medicaid Other | Admitting: *Deleted

## 2019-03-18 VITALS — BP 101/57 | HR 67 | Temp 97.9°F

## 2019-03-18 DIAGNOSIS — O09292 Supervision of pregnancy with other poor reproductive or obstetric history, second trimester: Secondary | ICD-10-CM | POA: Diagnosis not present

## 2019-03-18 DIAGNOSIS — O30032 Twin pregnancy, monochorionic/diamniotic, second trimester: Secondary | ICD-10-CM

## 2019-03-18 DIAGNOSIS — Z3A15 15 weeks gestation of pregnancy: Secondary | ICD-10-CM | POA: Diagnosis not present

## 2019-03-18 DIAGNOSIS — O30039 Twin pregnancy, monochorionic/diamniotic, unspecified trimester: Secondary | ICD-10-CM

## 2019-03-31 ENCOUNTER — Ambulatory Visit (HOSPITAL_COMMUNITY)
Admission: RE | Admit: 2019-03-31 | Discharge: 2019-03-31 | Disposition: A | Discharge status: Home / Self Care | Payer: Medicaid Other | Source: Ambulatory Visit | Attending: Obstetrics and Gynecology | Admitting: Obstetrics and Gynecology

## 2019-03-31 ENCOUNTER — Ambulatory Visit (HOSPITAL_COMMUNITY): Payer: Medicaid Other | Admitting: *Deleted

## 2019-03-31 ENCOUNTER — Encounter (HOSPITAL_COMMUNITY): Payer: Self-pay

## 2019-03-31 ENCOUNTER — Other Ambulatory Visit: Payer: Self-pay

## 2019-03-31 ENCOUNTER — Ambulatory Visit (INDEPENDENT_AMBULATORY_CARE_PROVIDER_SITE_OTHER): Payer: Medicaid Other | Admitting: Obstetrics and Gynecology

## 2019-03-31 VITALS — BP 98/61 | HR 69 | Wt 139.0 lb

## 2019-03-31 VITALS — BP 109/47 | HR 70 | Temp 98.1°F

## 2019-03-31 DIAGNOSIS — Z3A17 17 weeks gestation of pregnancy: Secondary | ICD-10-CM

## 2019-03-31 DIAGNOSIS — O30032 Twin pregnancy, monochorionic/diamniotic, second trimester: Secondary | ICD-10-CM | POA: Insufficient documentation

## 2019-03-31 DIAGNOSIS — O0990 Supervision of high risk pregnancy, unspecified, unspecified trimester: Secondary | ICD-10-CM | POA: Insufficient documentation

## 2019-03-31 DIAGNOSIS — Z3686 Encounter for antenatal screening for cervical length: Secondary | ICD-10-CM | POA: Diagnosis not present

## 2019-03-31 DIAGNOSIS — O09292 Supervision of pregnancy with other poor reproductive or obstetric history, second trimester: Secondary | ICD-10-CM

## 2019-03-31 DIAGNOSIS — O30039 Twin pregnancy, monochorionic/diamniotic, unspecified trimester: Secondary | ICD-10-CM

## 2019-03-31 DIAGNOSIS — O0992 Supervision of high risk pregnancy, unspecified, second trimester: Secondary | ICD-10-CM

## 2019-03-31 NOTE — Progress Notes (Signed)
Pt. Expresses that lower abdomin is hurting and has been going on for 2-3 weeks now. Pt. States the pain comes and goes throughout the day but gets worse at night.

## 2019-03-31 NOTE — Progress Notes (Signed)
Prenatal Visit Note Date: 03/31/2019 Clinic: [facility]  Subjective:  Kelli Soto is a 23 y.o. G2P1001 at [redacted]w[redacted]d being seen today for ongoing prenatal care.  She is currently monitored for the following issues for this high-risk pregnancy and has Low serum vitamin D; Neutropenia ([place]); Low iron; Supervision of high risk pregnancy, antepartum; History of poor fetal growth; and Twin gestation, monochorionic diamniotic on their problem list.  Patient reports lower abdominal pain and pressure off and on for past few weeks. No dysuria, vag bleeding, leakage of fluid.  Contractions: Not present. Vag. Bleeding: None.  Movement: Present. Denies leaking of fluid.   The following portions of the patient's history were reviewed and updated as appropriate: allergies, current medications, past family history, past medical history, past social history, past surgical history and problem list. Problem list updated.  Objective:   Vitals:   03/31/19 1107  BP: 98/61  Pulse: 69  Weight: 139 lb (63 kg)    Fetal Status: Fetal Heart Rate (bpm): 143/145   Movement: Present     General:  Alert, oriented and cooperative. Patient is in no acute distress.  Skin: Skin is warm and dry. No rash noted.   Cardiovascular: Normal heart rate noted  Respiratory: Normal respiratory effort, no problems with respiration noted  Abdomen: Soft, gravid, appropriate for gestational age. Pain/Pressure: Present     Pelvic:  EGBUS: normal Vag vault: normal Cervix: visually closed, length appears normal. On bimanual, feels closed but about 2cm in length.   Extremities: Normal range of motion.     Mental Status: Normal mood and affect. Normal behavior. Normal judgment and thought content.   Urinalysis:      Assessment and Plan:  Pregnancy: G2P1001 at [redacted]w[redacted]d  1. Supervision of high risk pregnancy, antepartum Will have MFM do a transvag cervical length this afternoon for concern for short cervix on  exam - AFP, Serum, Open Spina Bifida  2. Monochorionic diamniotic twin gestation in second trimester Set up for u/s already  Preterm labor symptoms and general obstetric precautions including but not limited to vaginal bleeding, contractions, leaking of fluid and fetal movement were reviewed in detail with the patient. Please refer to After Visit Summary for other counseling recommendations.  Return in about 3 weeks (around 04/21/2019) for high risk, in person.   Lyllie Halim, MD

## 2019-04-01 ENCOUNTER — Encounter (HOSPITAL_COMMUNITY): Payer: Self-pay | Admitting: *Deleted

## 2019-04-01 ENCOUNTER — Other Ambulatory Visit (HOSPITAL_COMMUNITY): Payer: Self-pay | Admitting: *Deleted

## 2019-04-01 ENCOUNTER — Ambulatory Visit (HOSPITAL_COMMUNITY): Payer: Medicaid Other | Admitting: *Deleted

## 2019-04-01 ENCOUNTER — Ambulatory Visit (HOSPITAL_BASED_OUTPATIENT_CLINIC_OR_DEPARTMENT_OTHER)
Admission: RE | Admit: 2019-04-01 | Discharge: 2019-04-01 | Disposition: A | Discharge status: Home / Self Care | Payer: Medicaid Other | Source: Ambulatory Visit | Attending: Obstetrics and Gynecology | Admitting: Obstetrics and Gynecology

## 2019-04-01 VITALS — BP 97/58 | HR 64 | Temp 98.6°F

## 2019-04-01 DIAGNOSIS — O30039 Twin pregnancy, monochorionic/diamniotic, unspecified trimester: Secondary | ICD-10-CM | POA: Diagnosis not present

## 2019-04-01 DIAGNOSIS — O30032 Twin pregnancy, monochorionic/diamniotic, second trimester: Secondary | ICD-10-CM | POA: Diagnosis not present

## 2019-04-01 DIAGNOSIS — Z3A17 17 weeks gestation of pregnancy: Secondary | ICD-10-CM | POA: Diagnosis not present

## 2019-04-01 DIAGNOSIS — O09292 Supervision of pregnancy with other poor reproductive or obstetric history, second trimester: Secondary | ICD-10-CM

## 2019-04-02 LAB — AFP, SERUM, OPEN SPINA BIFIDA
AFP MoM: 1.7
AFP Value: 82.8 ng/mL
Gest. Age on Collection Date: 17.4 weeks
Maternal Age At EDD: 23.6 yr
OSBR Risk 1 IN: 6486
Test Results:: NEGATIVE
Weight: 129 [lb_av]

## 2019-04-15 ENCOUNTER — Ambulatory Visit (HOSPITAL_COMMUNITY): Payer: Medicaid Other | Admitting: *Deleted

## 2019-04-15 ENCOUNTER — Other Ambulatory Visit: Payer: Self-pay

## 2019-04-15 ENCOUNTER — Encounter (HOSPITAL_COMMUNITY): Payer: Self-pay

## 2019-04-15 ENCOUNTER — Ambulatory Visit (HOSPITAL_COMMUNITY)
Admission: RE | Admit: 2019-04-15 | Discharge: 2019-04-15 | Disposition: A | Discharge status: Home / Self Care | Payer: Medicaid Other | Source: Ambulatory Visit | Attending: Obstetrics and Gynecology | Admitting: Obstetrics and Gynecology

## 2019-04-15 VITALS — BP 107/54 | HR 63 | Temp 98.4°F

## 2019-04-15 DIAGNOSIS — O0990 Supervision of high risk pregnancy, unspecified, unspecified trimester: Secondary | ICD-10-CM

## 2019-04-15 DIAGNOSIS — O09299 Supervision of pregnancy with other poor reproductive or obstetric history, unspecified trimester: Secondary | ICD-10-CM

## 2019-04-15 DIAGNOSIS — Z3A19 19 weeks gestation of pregnancy: Secondary | ICD-10-CM

## 2019-04-15 DIAGNOSIS — O30032 Twin pregnancy, monochorionic/diamniotic, second trimester: Secondary | ICD-10-CM

## 2019-04-15 DIAGNOSIS — O30039 Twin pregnancy, monochorionic/diamniotic, unspecified trimester: Secondary | ICD-10-CM | POA: Diagnosis present

## 2019-04-21 ENCOUNTER — Ambulatory Visit (INDEPENDENT_AMBULATORY_CARE_PROVIDER_SITE_OTHER): Payer: Medicaid Other | Admitting: Obstetrics & Gynecology

## 2019-04-21 ENCOUNTER — Other Ambulatory Visit: Payer: Self-pay

## 2019-04-21 DIAGNOSIS — O0990 Supervision of high risk pregnancy, unspecified, unspecified trimester: Secondary | ICD-10-CM

## 2019-04-21 DIAGNOSIS — Z3A20 20 weeks gestation of pregnancy: Secondary | ICD-10-CM

## 2019-04-21 DIAGNOSIS — O0992 Supervision of high risk pregnancy, unspecified, second trimester: Secondary | ICD-10-CM

## 2019-04-21 MED ORDER — DOCUSATE SODIUM 100 MG PO CAPS: 100.0000 mg | ORAL_CAPSULE | Freq: Two times a day (BID) | ORAL | 0 refills | Status: DC | PRN

## 2019-04-21 MED ORDER — BLOOD PRESSURE MONITOR KIT: PACK | 0 refills | Status: DC

## 2019-04-21 NOTE — Progress Notes (Signed)
   PRENATAL VISIT NOTE  Subjective:  Kelli Soto is a 23 y.o. single G10P1001 (23 yo daughter) at [redacted]w[redacted]d being seen today for ongoing prenatal care.  She is currently monitored for the following issues for this high-risk pregnancy and has Low serum vitamin D; Neutropenia ([place]); Low iron; Supervision of high risk pregnancy, antepartum; History of poor fetal growth; and Twin gestation, monochorionic diamniotic on their problem list.  Patient reports no complaints.  Contractions: Not present. Vag. Bleeding: None.  Movement: Present. Denies leaking of fluid.   The following portions of the patient's history were reviewed and updated as appropriate: allergies, current medications, past family history, past medical history, past social history, past surgical history and problem list.   Objective:   Vitals:   04/21/19 1041  BP: 103/63  Pulse: 72    Fetal Status: Fetal Heart Rate (bpm): 138/129   Movement: Present     General:  Alert, oriented and cooperative. Patient is in no acute distress.  Skin: Skin is warm and dry. No rash noted.   Cardiovascular: Normal heart rate noted  Respiratory: Normal respiratory effort, no problems with respiration noted  Abdomen: Soft, gravid, appropriate for gestational age.  Pain/Pressure: Absent     Pelvic: Cervical exam deferred        Extremities: Normal range of motion.     Mental Status: Normal mood and affect. Normal behavior. Normal judgment and thought content.   Assessment and Plan:  Pregnancy: G2P1001 at [redacted]w[redacted]d 1. Supervision of high risk pregnancy, antepartum - anatomy u/s ordered - discussed watching her weight gain (risks of too much explained)  Preterm labor symptoms and general obstetric precautions including but not limited to vaginal bleeding, contractions, leaking of fluid and fetal movement were reviewed in detail with the patient. Please refer to After Visit Summary for other counseling recommendations.   Return in about 4 weeks  (around 05/19/2019) for virtual.  Future Appointments  Date Time Provider [place]  04/30/2019 10:15 AM WH-MFC [place] 4 WH-MFCUS MFC-US  04/30/2019 10:20 AM WH-MFC NURSE WH-MFC MFC-US    Emily Filbert, MD

## 2019-04-21 NOTE — Addendum Note (Signed)
Addended by: Phillip Heal, DEMETRICE A on: 04/21/2019 11:11 AM   Modules accepted: Orders

## 2019-04-21 NOTE — Progress Notes (Signed)
Needs refill on colace

## 2019-04-25 ENCOUNTER — Inpatient Hospital Stay (HOSPITAL_COMMUNITY)
Admission: AD | Admit: 2019-04-25 | Discharge: 2019-04-25 | Disposition: A | Discharge status: Home / Self Care | Payer: Medicaid Other | Attending: Obstetrics and Gynecology | Admitting: Obstetrics and Gynecology

## 2019-04-25 ENCOUNTER — Other Ambulatory Visit: Payer: Self-pay

## 2019-04-25 ENCOUNTER — Encounter (HOSPITAL_COMMUNITY): Payer: Self-pay

## 2019-04-25 DIAGNOSIS — D649 Anemia, unspecified: Secondary | ICD-10-CM | POA: Insufficient documentation

## 2019-04-25 DIAGNOSIS — R519 Headache, unspecified: Secondary | ICD-10-CM | POA: Diagnosis not present

## 2019-04-25 DIAGNOSIS — O30032 Twin pregnancy, monochorionic/diamniotic, second trimester: Secondary | ICD-10-CM | POA: Diagnosis not present

## 2019-04-25 DIAGNOSIS — Z3A21 21 weeks gestation of pregnancy: Secondary | ICD-10-CM | POA: Diagnosis not present

## 2019-04-25 DIAGNOSIS — O26892 Other specified pregnancy related conditions, second trimester: Secondary | ICD-10-CM | POA: Insufficient documentation

## 2019-04-25 DIAGNOSIS — O99012 Anemia complicating pregnancy, second trimester: Secondary | ICD-10-CM | POA: Insufficient documentation

## 2019-04-25 LAB — COMPREHENSIVE METABOLIC PANEL
ALT: 12 U/L (ref 0–44)
AST: 19 U/L (ref 15–41)
Albumin: 3.1 g/dL — ABNORMAL LOW (ref 3.5–5.0)
Alkaline Phosphatase: 53 U/L (ref 38–126)
Anion gap: 9 (ref 5–15)
BUN: 7 mg/dL (ref 6–20)
CO2: 20 mmol/L — ABNORMAL LOW (ref 22–32)
Calcium: 8.6 mg/dL — ABNORMAL LOW (ref 8.9–10.3)
Chloride: 106 mmol/L (ref 98–111)
Creatinine, Ser: 0.41 mg/dL — ABNORMAL LOW (ref 0.44–1.00)
GFR calc Af Amer: >60 mL/min (ref >60)
GFR calc non Af Amer: >60 mL/min (ref >60)
Glucose, Bld: 75 mg/dL (ref 70–99)
Potassium: 3.9 mmol/L (ref 3.5–5.1)
Sodium: 135 mmol/L (ref 135–145)
Total Bilirubin: 0.2 mg/dL — ABNORMAL LOW (ref 0.3–1.2)
Total Protein: 6.2 g/dL — ABNORMAL LOW (ref 6.5–8.1)

## 2019-04-25 LAB — URINALYSIS, ROUTINE W REFLEX MICROSCOPIC
Bilirubin Urine: NEGATIVE
Glucose, UA: NEGATIVE mg/dL
Hgb urine dipstick: NEGATIVE
Ketones, ur: NEGATIVE mg/dL
Nitrite: NEGATIVE
Protein, ur: NEGATIVE mg/dL
Specific Gravity, Urine: 1.019 (ref 1.005–1.030)
pH: 7 (ref 5.0–8.0)

## 2019-04-25 LAB — CBC
HCT: 29.9 % — ABNORMAL LOW (ref 36.0–46.0)
Hemoglobin: 9.9 g/dL — ABNORMAL LOW (ref 12.0–15.0)
MCH: 30.6 pg (ref 26.0–34.0)
MCHC: 33.1 g/dL (ref 30.0–36.0)
MCV: 92.3 fL (ref 80.0–100.0)
Platelets: 187 10*3/uL (ref 150–400)
RBC: 3.24 MIL/uL — ABNORMAL LOW (ref 3.87–5.11)
RDW: 12.2 % (ref 11.5–15.5)
WBC: 4.9 10*3/uL (ref 4.0–10.5)
nRBC: 0 % (ref 0.0–0.2)

## 2019-04-25 MED ORDER — METOCLOPRAMIDE HCL 5 MG/ML IJ SOLN
10.0000 mg | Freq: Once | INTRAMUSCULAR | Status: AC
  Administered 2019-04-25: 16:00:00 10 mg via INTRAVENOUS
  Filled 2019-04-25: qty 2

## 2019-04-25 MED ORDER — FERROUS FUMARATE 325 (106 FE) MG PO TABS: 1.0000 | ORAL_TABLET | Freq: Every day | ORAL | 5 refills | Status: DC

## 2019-04-25 MED ORDER — DIPHENHYDRAMINE HCL 50 MG/ML IJ SOLN
25.0000 mg | Freq: Once | INTRAMUSCULAR | Status: AC
  Administered 2019-04-25: 25 mg via INTRAVENOUS
  Filled 2019-04-25: qty 1

## 2019-04-25 MED ORDER — DEXAMETHASONE SODIUM PHOSPHATE 10 MG/ML IJ SOLN
10.0000 mg | Freq: Once | INTRAMUSCULAR | Status: AC
  Administered 2019-04-25: 10 mg via INTRAVENOUS
  Filled 2019-04-25: qty 1

## 2019-04-25 MED ORDER — BUTALBITAL-APAP-CAFFEINE 50-325-40 MG PO TABS: 1.0000 | ORAL_TABLET | Freq: Four times a day (QID) | ORAL | 0 refills | Status: DC | PRN

## 2019-04-25 MED ORDER — LACTATED RINGERS IV BOLUS (SEPSIS)
1000.0000 mL | Freq: Once | INTRAVENOUS | Status: AC
  Administered 2019-04-25: 1000 mL via INTRAVENOUS

## 2019-04-25 MED ORDER — FAMOTIDINE IN NACL 20-0.9 MG/50ML-% IV SOLN
20.0000 mg | Freq: Once | INTRAVENOUS | Status: AC
  Administered 2019-04-25: 20 mg via INTRAVENOUS
  Filled 2019-04-25: qty 50

## 2019-04-25 NOTE — MAU Note (Signed)
Kelli Soto is a 23 y.o. at [redacted]w[redacted]d here in MAU reporting: headache for 3 days and started having chest pain today. States the chest pain feels like a pulling sensation on the left side. Been feeling lightheaded and dizzy since the headache started. Took a 500mg  of tylenol this AM with no relief.   Onset of complaint: ongoing  Pain score: headache 8/10, chest pain 7/10  Vitals:   04/25/19 1441  BP: (!) 106/48  Pulse: 69  Resp: 16  Temp: 98.1 F (36.7 C)  SpO2: 100%     Lab orders placed from triage: UA

## 2019-04-25 NOTE — MAU Provider Note (Signed)
Chief Complaint: Headache, Chest Pain, and Dizziness   First Provider Initiated Contact with Patient 04/25/19 1520      SUBJECTIVE HPI: Kelli Soto is a 23 y.o. G2P1001 at 62w1dby LMP c/w early [place] twins pregnancy who presents to maternity admissions reporting headache x 3 days, dizziness 2-3 days, and onset of chest pain today. She reports her h/a is constant, dull, frontal, and was not relieved by Tylenol 500 mg she took this morning.  It is worsening since onset.  The chest pain is a pulling sensation, mostly in her left upper chest but some in the right also. The pain started while she was driving and eating in the car.  She reports frequent heartburn and has not been treating it during the pregnancy. She was planning to get some Tums but had not tried them yet.  There are no other symptoms. She has not tried any other treatments.    HPI  Past Medical History:  Diagnosis Date  . Bronchitis   . Chlamydia 02/19/2017  . Gonorrhea 2019  . Neutropenia ([facility]    Past Surgical History:  Procedure Laterality Date  . NO PAST SURGERIES     Social History   Socioeconomic History  . Marital status: Single    Spouse name: Not on file  . Number of children: Not on file  . Years of education: Not on file  . Highest education level: Not on file  Occupational History  . Not on file  Social Needs  . Financial resource strain: Not on file  . Food insecurity    Worry: Not on file    Inability: Not on file  . Transportation needs    Medical: Not on file    Non-medical: Not on file  Tobacco Use  . Smoking status: Never Smoker  . Smokeless tobacco: Never Used  Substance and Sexual Activity  . Alcohol use: No  . Drug use: Not Currently    Types: Marijuana    Comment: last smoked 2 weeks from August 28th  . Sexual activity: Yes    Comment: pregnant  Lifestyle  . Physical activity    Days per week: Not on file    Minutes per session: Not on file  . Stress: Not on file   Relationships  . Social [place] phone: Not on file    Gets together: Not on file    Attends religious service: Not on file    Active member of club or organization: Not on file    Attends meetings of clubs or organizations: Not on file    Relationship status: Not on file  . Intimate partner violence    Fear of current or ex partner: Not on file    Emotionally abused: Not on file    Physically abused: Not on file    Forced sexual activity: Not on file  Other Topics Concern  . Not on file  Social History Narrative   [place]; no smoking; no alcohol; used to be med tDesigner, multimedia    No current facility-administered medications on file prior to encounter.    Current Outpatient Medications on File Prior to Encounter  Medication Sig Dispense Refill  . aspirin EC 81 MG tablet Take 1 tablet (81 mg total) by mouth daily. 60 tablet 2  . Blood Pressure Monitor KIT Please check blood pressure 1-2 times per week 1 kit 0  . Cholecalciferol (VITAMIN D) 50 MCG (2000  UT) tablet Take 1 tablet (2,000 Units total) by mouth daily. 60 tablet 6  . docusate sodium (COLACE) 100 MG capsule Take 1 capsule (100 mg total) by mouth 2 (two) times daily as needed for mild constipation. 60 capsule 0  . Doxylamine-Pyridoxine 10-10 MG TBEC Take 2 tablets by mouth at bedtime. May also take 1 tab in am and 1 tab in afternoon (Patient not taking: Reported on 02/25/2019) 845 tablet 1  . folic acid (FOLVITE) 1 MG tablet Take 1 tablet (1 mg total) by mouth daily. (Patient not taking: Reported on 02/25/2019) 60 tablet 3  . metoCLOPramide (REGLAN) 10 MG tablet Take 1 tablet (10 mg total) by mouth every 6 (six) hours. 30 tablet 2  . polyethylene glycol (MIRALAX / GLYCOLAX) 17 g packet Take 17 g by mouth 2 (two) times daily as needed (constipation). 30 each 1  . Prenatal Vit-Fe Fumarate-FA (PRENATAL VITAMIN PO) Take by mouth.     No Known Allergies  ROS:  Review of Systems  Constitutional:  Negative for chills, fatigue and fever.  Respiratory: Negative for shortness of breath.   Cardiovascular: Positive for chest pain.  Gastrointestinal: Negative for nausea and vomiting.  Genitourinary: Negative for difficulty urinating, dysuria, flank pain, pelvic pain, vaginal bleeding, vaginal discharge and vaginal pain.  Neurological: Positive for dizziness and headaches.  Psychiatric/Behavioral: Negative.      I have reviewed patient's Past Medical Hx, Surgical Hx, Family Hx, Social Hx, medications and allergies.   Physical Exam   Patient Vitals for the past 24 hrs:  BP Temp Temp src Pulse Resp SpO2 Height Weight  04/25/19 1441 (!) 106/48 98.1 F (36.7 C) Oral 69 16 100 % - -  04/25/19 1436 - - - - - - '5\' 4"'  (1.626 m) 67.3 kg   Constitutional: Well-developed, well-nourished female in no acute distress.  HEART: normal rate, heart sounds, regular rhythm RESP: normal effort, lung sounds clear and equal bilaterally Chest: Chest pain reproducible to palpation of bilateral upper chest and epigastric area GI: Abd soft, non-tender. Pos BS x 4 MS: Extremities nontender, no edema, normal ROM Neurologic: Alert and oriented x 4.  GU: Neg CVAT.  PELVIC EXAM: Deferred  FHT 138 by doppler  LAB RESULTS Results for orders placed or performed during the hospital encounter of 04/25/19 (from the past 24 hour(s))  Urinalysis, Routine w reflex microscopic     Status: Abnormal   Collection Time: 04/25/19  2:56 PM  Result Value Ref Range   Color, Urine YELLOW YELLOW   APPearance CLOUDY (A) CLEAR   Specific Gravity, Urine 1.019 1.005 - 1.030   pH 7.0 5.0 - 8.0   Glucose, UA NEGATIVE NEGATIVE mg/dL   Hgb urine dipstick NEGATIVE NEGATIVE   Bilirubin Urine NEGATIVE NEGATIVE   Ketones, ur NEGATIVE NEGATIVE mg/dL   Protein, ur NEGATIVE NEGATIVE mg/dL   Nitrite NEGATIVE NEGATIVE   Leukocytes,Ua MODERATE (A) NEGATIVE   RBC / HPF 21-50 0 - 5 RBC/hpf   WBC, UA 0-5 0 - 5 WBC/hpf   Bacteria, UA RARE  (A) NONE SEEN   Squamous Epithelial / LPF 6-10 0 - 5   Mucus PRESENT    Amorphous Crystal PRESENT    Non Squamous Epithelial 0-5 (A) NONE SEEN  CBC     Status: Abnormal   Collection Time: 04/25/19  4:19 PM  Result Value Ref Range   WBC 4.9 4.0 - 10.5 K/uL   RBC 3.24 (L) 3.87 - 5.11 MIL/uL   Hemoglobin 9.9 (L) 12.0 -  15.0 g/dL   HCT 29.9 (L) 36.0 - 46.0 %   MCV 92.3 80.0 - 100.0 fL   MCH 30.6 26.0 - 34.0 pg   MCHC 33.1 30.0 - 36.0 g/dL   RDW 12.2 11.5 - 15.5 %   Platelets 187 150 - 400 K/uL   nRBC 0.0 0.0 - 0.2 %  Comprehensive metabolic panel     Status: Abnormal   Collection Time: 04/25/19  4:19 PM  Result Value Ref Range   Sodium 135 135 - 145 mmol/L   Potassium 3.9 3.5 - 5.1 mmol/L   Chloride 106 98 - 111 mmol/L   CO2 20 (L) 22 - 32 mmol/L   Glucose, Bld 75 70 - 99 mg/dL   BUN 7 6 - 20 mg/dL   Creatinine, Ser 0.41 (L) 0.44 - 1.00 mg/dL   Calcium 8.6 (L) 8.9 - 10.3 mg/dL   Total Protein 6.2 (L) 6.5 - 8.1 g/dL   Albumin 3.1 (L) 3.5 - 5.0 g/dL   AST 19 15 - 41 U/L   ALT 12 0 - 44 U/L   Alkaline Phosphatase 53 38 - 126 U/L   Total Bilirubin 0.2 (L) 0.3 - 1.2 mg/dL   GFR calc non Af Amer >60 >60 mL/min   GFR calc Af Amer >60 >60 mL/min   Anion gap 9 5 - 15    O/Positive/-- (08/25 1049)  IMAGING   MAU Management/MDM: Orders Placed This Encounter  Procedures  . Urinalysis, Routine w reflex microscopic  . CBC  . Comprehensive metabolic panel  . ED EKG  . EKG 12-Lead  . EKG 12-Lead  . Discharge patient Discharge disposition: 01-Home or Self Care; Discharge patient date: 04/25/2019    Meds ordered this encounter  Medications  . FOLLOWED BY Linked Order Group   . lactated ringers bolus 1,000 mL   . diphenhydrAMINE (BENADRYL) injection 25 mg   . metoCLOPramide (REGLAN) injection 10 mg   . dexamethasone (DECADRON) injection 10 mg  . famotidine (PEPCID) IVPB 20 mg premix  . ferrous fumarate (HEMOCYTE - 106 MG FE) 325 (106 Fe) MG TABS tablet    Sig: Take 1 tablet  (106 mg of iron total) by mouth daily.    Dispense:  30 tablet    Refill:  5    Order Specific Question:   Supervising Provider    Answer:   Sloan Leiter [[id number]]  . butalbital-acetaminophen-caffeine (FIORICET) 50-325-40 MG tablet    Sig: Take 1-2 tablets by mouth every 6 (six) hours as needed for headache.    Dispense:  20 tablet    Refill:  0    Order Specific Question:   Supervising Provider    Answer:   Sloan Leiter [[id number]]    Chest pain reproducible to palpation, heart and lung sounds clear and O2 saturation 100% on RA.  Unlikely cardiopulmonary cause for pain but EKG ordered.  Headache cocktail ordered with IV fluids, Reglan, Benadryl and Decadron, and add Pepcid IV for heartburn/GERD.  CBC, CMP pending.  Pt reports complete resolution of symptoms with IV fluids/medications.  CMP wnl, CBC with hgb 9.9, down from 12.4 in early pregnancy.  Anemia is likely cause of pt h/a and dizziness.  Chest pain c/w GERD.  Consult Dr Rosana Hoes with assessment and findings, offered pt IV Fereheme today in MAU.  Pt declined but agrees to outpatient Fereheme treatments.  Message sent to [facility] to set up IV treatments.  Rx for oral iron and Fioricet sent to pharmacy.  Pt to f/u in office as scheduled, return to MAU as needed.  Increase iron intake in foods and increase PO fluids. Pt discharged with strict return precautions.  ASSESSMENT 1. Headache in pregnancy, antepartum, second trimester   2. Anemia affecting pregnancy in second trimester   3. Monochorionic diamniotic twin gestation in second trimester     PLAN Discharge home Allergies as of 04/25/2019   No Known Allergies     Medication List    STOP taking these medications   Doxylamine-Pyridoxine 10-10 MG Tbec     TAKE these medications   aspirin EC 81 MG tablet Take 1 tablet (81 mg total) by mouth daily.   Blood Pressure Monitor Kit Please check blood pressure 1-2 times per week   butalbital-acetaminophen-caffeine 50-325-40 MG  tablet Commonly known as: FIORICET Take 1-2 tablets by mouth every 6 (six) hours as needed for headache.   docusate sodium 100 MG capsule Commonly known as: COLACE Take 1 capsule (100 mg total) by mouth 2 (two) times daily as needed for mild constipation.   ferrous fumarate 325 (106 Fe) MG Tabs tablet Commonly known as: HEMOCYTE - 106 mg FE Take 1 tablet (106 mg of iron total) by mouth daily.   folic acid 1 MG tablet Commonly known as: FOLVITE Take 1 tablet (1 mg total) by mouth daily.   metoCLOPramide 10 MG tablet Commonly known as: REGLAN Take 1 tablet (10 mg total) by mouth every 6 (six) hours.   polyethylene glycol 17 g packet Commonly known as: MIRALAX / GLYCOLAX Take 17 g by mouth 2 (two) times daily as needed (constipation).   PRENATAL VITAMIN PO Take by mouth.   Vitamin D 50 MCG (2000 UT) tablet Take 1 tablet (2,000 Units total) by mouth daily.      Follow-up [place] for [facility] Follow up.   Specialty: Obstetrics and Gynecology Why: As scheduled, the office will call you to set up IV iron infusions.   Contact information: [place] Certified Nurse-Midwife 04/25/2019  6:11 PM

## 2019-04-30 ENCOUNTER — Ambulatory Visit (HOSPITAL_COMMUNITY)
Admission: RE | Admit: 2019-04-30 | Discharge: 2019-04-30 | Disposition: A | Discharge status: Home / Self Care | Payer: Medicaid Other | Source: Ambulatory Visit | Attending: Obstetrics and Gynecology | Admitting: Obstetrics and Gynecology

## 2019-04-30 ENCOUNTER — Ambulatory Visit (HOSPITAL_COMMUNITY): Payer: Medicaid Other | Admitting: *Deleted

## 2019-04-30 ENCOUNTER — Other Ambulatory Visit: Payer: Self-pay

## 2019-04-30 ENCOUNTER — Encounter (HOSPITAL_COMMUNITY): Payer: Self-pay

## 2019-04-30 VITALS — BP 107/51 | HR 63 | Temp 98.6°F

## 2019-04-30 DIAGNOSIS — O09299 Supervision of pregnancy with other poor reproductive or obstetric history, unspecified trimester: Secondary | ICD-10-CM

## 2019-04-30 DIAGNOSIS — Z362 Encounter for other antenatal screening follow-up: Secondary | ICD-10-CM | POA: Diagnosis not present

## 2019-04-30 DIAGNOSIS — O30039 Twin pregnancy, monochorionic/diamniotic, unspecified trimester: Secondary | ICD-10-CM | POA: Insufficient documentation

## 2019-04-30 DIAGNOSIS — O30032 Twin pregnancy, monochorionic/diamniotic, second trimester: Secondary | ICD-10-CM | POA: Insufficient documentation

## 2019-04-30 DIAGNOSIS — Z3A21 21 weeks gestation of pregnancy: Secondary | ICD-10-CM

## 2019-05-01 ENCOUNTER — Other Ambulatory Visit (HOSPITAL_COMMUNITY): Payer: Self-pay | Admitting: *Deleted

## 2019-05-01 DIAGNOSIS — O30032 Twin pregnancy, monochorionic/diamniotic, second trimester: Secondary | ICD-10-CM

## 2019-05-12 ENCOUNTER — Ambulatory Visit (HOSPITAL_COMMUNITY)
Admission: RE | Admit: 2019-05-12 | Discharge: 2019-05-12 | Disposition: A | Discharge status: Home / Self Care | Payer: Medicaid Other | Source: Ambulatory Visit | Attending: Obstetrics | Admitting: Obstetrics

## 2019-05-12 ENCOUNTER — Other Ambulatory Visit: Payer: Self-pay

## 2019-05-12 ENCOUNTER — Ambulatory Visit (HOSPITAL_COMMUNITY): Payer: Medicaid Other | Admitting: *Deleted

## 2019-05-12 VITALS — BP 110/54 | HR 78 | Temp 96.9°F

## 2019-05-12 DIAGNOSIS — Z3A23 23 weeks gestation of pregnancy: Secondary | ICD-10-CM | POA: Diagnosis not present

## 2019-05-12 DIAGNOSIS — O30032 Twin pregnancy, monochorionic/diamniotic, second trimester: Secondary | ICD-10-CM | POA: Diagnosis not present

## 2019-05-12 DIAGNOSIS — O09299 Supervision of pregnancy with other poor reproductive or obstetric history, unspecified trimester: Secondary | ICD-10-CM | POA: Diagnosis not present

## 2019-05-20 ENCOUNTER — Other Ambulatory Visit: Payer: Self-pay

## 2019-05-20 ENCOUNTER — Telehealth (INDEPENDENT_AMBULATORY_CARE_PROVIDER_SITE_OTHER): Payer: Medicaid Other | Admitting: Family Medicine

## 2019-05-20 VITALS — BP 106/54 | HR 64 | Wt 152.2 lb

## 2019-05-20 DIAGNOSIS — O99012 Anemia complicating pregnancy, second trimester: Secondary | ICD-10-CM | POA: Diagnosis not present

## 2019-05-20 DIAGNOSIS — O0992 Supervision of high risk pregnancy, unspecified, second trimester: Secondary | ICD-10-CM

## 2019-05-20 DIAGNOSIS — O30032 Twin pregnancy, monochorionic/diamniotic, second trimester: Secondary | ICD-10-CM

## 2019-05-20 DIAGNOSIS — O402XX2 Polyhydramnios, second trimester, fetus 2: Secondary | ICD-10-CM

## 2019-05-20 DIAGNOSIS — Z87898 Personal history of other specified conditions: Secondary | ICD-10-CM

## 2019-05-20 DIAGNOSIS — O0990 Supervision of high risk pregnancy, unspecified, unspecified trimester: Secondary | ICD-10-CM

## 2019-05-20 DIAGNOSIS — O402XX1 Polyhydramnios, second trimester, fetus 1: Secondary | ICD-10-CM | POA: Diagnosis not present

## 2019-05-20 DIAGNOSIS — Z3A24 24 weeks gestation of pregnancy: Secondary | ICD-10-CM

## 2019-05-20 DIAGNOSIS — O409XX0 Polyhydramnios, unspecified trimester, not applicable or unspecified: Secondary | ICD-10-CM

## 2019-05-20 MED ORDER — FERROUS FUMARATE 325 (106 FE) MG PO TABS: 1.0000 | ORAL_TABLET | Freq: Two times a day (BID) | ORAL | 5 refills | Status: DC

## 2019-05-20 NOTE — Progress Notes (Signed)
   TELEHEALTH VIRTUAL OBSTETRICS VISIT ENCOUNTER NOTE  I connected with Kelli Soto on 05/20/19 at 10:00 AM EST by telephone at home and verified that I am speaking with the correct person using two identifiers.   I discussed the limitations, risks, security and privacy concerns of performing an evaluation and management service by telephone and the availability of in person appointments. I also discussed with the patient that there may be a patient responsible charge related to this service. The patient expressed understanding and agreed to proceed.  Subjective:  Kelli Soto is a 23 y.o. G2P1001 at [redacted]w[redacted]d being followed for ongoing prenatal care.  She is currently monitored for the following issues for this high-risk pregnancy and has Low serum vitamin D; Neutropenia ([place]); Low iron; Supervision of high risk pregnancy, antepartum; History of poor fetal growth; and Twin gestation, monochorionic diamniotic on their problem list.  Patient reports no complaints. Reports fetal movement. Denies any contractions, bleeding or leaking of fluid.   The following portions of the patient's history were reviewed and updated as appropriate: allergies, current medications, past family history, past medical history, past social history, past surgical history and problem list.   Objective:   General:  Alert, oriented and cooperative.   Mental Status: Normal mood and affect perceived. Normal judgment and thought content.  Rest of physical exam deferred due to type of encounter  Assessment and Plan:  Pregnancy: G2P1001 at [redacted]w[redacted]d  1. History of poor fetal growth Has regular [place] for mo-di twins  2. Monochorionic diamniotic twin gestation in second trimester Has MFM [place] scheduled  Feeling FM for both infants Mild Poly identified 11/24.  Added to problem list  Given her regular MFM [place] and likely Antenatal testing, most visits at [facility] can be virtual if BP monitoring is available.   3. Supervision of high  risk pregnancy, antepartum Up to date Updated pregnancy box Reviewed finding on [place] again regarding mono-di and let patient know about high risk pregnancy Discussed importance of MFM involvment.   Preterm labor symptoms and general obstetric precautions including but not limited to vaginal bleeding, contractions, leaking of fluid and fetal movement were reviewed in detail with the patient.  I discussed the assessment and treatment plan with the patient. The patient was provided an opportunity to ask questions and all were answered. The patient agreed with the plan and demonstrated an understanding of the instructions. The patient was advised to call back or seek an in-person office evaluation/go to MAU at [facility] for any urgent or concerning symptoms. Please refer to After Visit Summary for other counseling recommendations.   I provided 15 minutes of non-face-to-face time during this encounter.  Return in about 4 weeks (around 06/17/2019) for Routine prenatal care, 28 wk labs.  Future Appointments  Date Time Provider [place]  05/27/2019  9:40 AM Roxie Mathis MFC-US  05/27/2019  9:45 AM [place] 2 WH-MFCUS MFC-US    Caren Macadam, [place] for [facility], [place]

## 2019-05-20 NOTE — Progress Notes (Signed)
I connected with  Kelli Soto on 05/20/19 at 10:00 AM EST by telephone and verified that I am speaking with the correct person using two identifiers.   I discussed the limitations, risks, security and privacy concerns of performing an evaluation and management service by telephone and the availability of in person appointments. I also discussed with the patient that there may be a patient responsible charge related to this service. The patient expressed understanding and agreed to proceed.  Candela Krul Jeanella Anton, CMA 05/20/2019  10:14 AM

## 2019-05-24 DIAGNOSIS — O409XX0 Polyhydramnios, unspecified trimester, not applicable or unspecified: Secondary | ICD-10-CM | POA: Insufficient documentation

## 2019-05-25 ENCOUNTER — Inpatient Hospital Stay (HOSPITAL_COMMUNITY)
Admission: AD | Admit: 2019-05-25 | Discharge: 2019-05-25 | Disposition: A | Discharge status: Home / Self Care | Payer: Medicaid Other | Attending: Obstetrics and Gynecology | Admitting: Obstetrics and Gynecology

## 2019-05-25 ENCOUNTER — Encounter (HOSPITAL_COMMUNITY): Payer: Self-pay

## 2019-05-25 ENCOUNTER — Other Ambulatory Visit: Payer: Self-pay

## 2019-05-25 DIAGNOSIS — Z79899 Other long term (current) drug therapy: Secondary | ICD-10-CM | POA: Diagnosis not present

## 2019-05-25 DIAGNOSIS — B373 Candidiasis of vulva and vagina: Secondary | ICD-10-CM | POA: Diagnosis not present

## 2019-05-25 DIAGNOSIS — O98812 Other maternal infectious and parasitic diseases complicating pregnancy, second trimester: Secondary | ICD-10-CM | POA: Insufficient documentation

## 2019-05-25 DIAGNOSIS — O30032 Twin pregnancy, monochorionic/diamniotic, second trimester: Secondary | ICD-10-CM | POA: Diagnosis not present

## 2019-05-25 DIAGNOSIS — N939 Abnormal uterine and vaginal bleeding, unspecified: Secondary | ICD-10-CM | POA: Diagnosis present

## 2019-05-25 DIAGNOSIS — Z8249 Family history of ischemic heart disease and other diseases of the circulatory system: Secondary | ICD-10-CM | POA: Insufficient documentation

## 2019-05-25 DIAGNOSIS — Z3A25 25 weeks gestation of pregnancy: Secondary | ICD-10-CM

## 2019-05-25 DIAGNOSIS — Z7982 Long term (current) use of aspirin: Secondary | ICD-10-CM | POA: Insufficient documentation

## 2019-05-25 DIAGNOSIS — B3731 Acute candidiasis of vulva and vagina: Secondary | ICD-10-CM

## 2019-05-25 HISTORY — DX: Anemia, unspecified: D64.9

## 2019-05-25 LAB — URINALYSIS, ROUTINE W REFLEX MICROSCOPIC
Bilirubin Urine: NEGATIVE
Glucose, UA: NEGATIVE mg/dL
Ketones, ur: NEGATIVE mg/dL
Nitrite: NEGATIVE
Protein, ur: 30 mg/dL — AB
Specific Gravity, Urine: 1.018 (ref 1.005–1.030)
pH: 8 (ref 5.0–8.0)

## 2019-05-25 LAB — WET PREP, GENITAL
Clue Cells Wet Prep HPF POC: NONE SEEN
Sperm: NONE SEEN
Trich, Wet Prep: NONE SEEN

## 2019-05-25 MED ORDER — TERCONAZOLE 0.4 % VA CREA: 1.0000 | TOPICAL_CREAM | Freq: Every day | VAGINAL | 0 refills | Status: DC

## 2019-05-25 NOTE — MAU Note (Signed)
.   Kelli Soto is a 23 y.o. at [redacted]w[redacted]d here in MAU reporting: vaginal bleeding that began 1140. She states that it was brown in her underwear but it is pink and bright red when she wipes. Last sexual intercourse was last night. Denies LOF and + fetal movement.  Pain score: 0 Vitals:   05/25/19 1333  BP: (!) 118/53  Pulse: 66  Resp: 16  Temp: 98.3 F (36.8 C)  SpO2: 100%     FHT: A: 141          B: 143 Lab orders placed from triage:

## 2019-05-25 NOTE — Discharge Instructions (Signed)
Vaginal Yeast infection, Adult  Vaginal yeast infection is a condition that causes vaginal discharge as well as soreness, swelling, and redness (inflammation) of the vagina. This is a common condition. Some women get this infection frequently. What are the causes? This condition is caused by a change in the normal balance of the yeast (candida) and bacteria that live in the vagina. This change causes an overgrowth of yeast, which causes the inflammation. What increases the risk? The condition is more likely to develop in women who:  Take antibiotic medicines.  Have diabetes.  Take birth control pills.  Are pregnant.  Douche often.  Have a weak body defense system (immune system).  Have been taking steroid medicines for a long time.  Frequently wear tight clothing. What are the signs or symptoms? Symptoms of this condition include:  White, thick, creamy vaginal discharge.  Swelling, itching, redness, and irritation of the vagina. The lips of the vagina (vulva) may be affected as well.  Pain or a burning feeling while urinating.  Pain during sex. How is this diagnosed? This condition is diagnosed based on:  Your medical history.  A physical exam.  A pelvic exam. Your health care provider will examine a sample of your vaginal discharge under a microscope. Your health care provider may send this sample for testing to confirm the diagnosis. How is this treated? This condition is treated with medicine. Medicines may be over-the-counter or prescription. You may be told to use one or more of the following:  Medicine that is taken by mouth (orally).  Medicine that is applied as a cream (topically).  Medicine that is inserted directly into the vagina (suppository). Follow these instructions at home:  Lifestyle  Do not have sex until your health care provider approves. Tell your sex partner that you have a yeast infection. That person should go to his or her health care  provider and ask if they should also be treated.  Do not wear tight clothes, such as pantyhose or tight pants.  Wear breathable cotton underwear. General instructions  Take or apply over-the-counter and prescription medicines only as told by your health care provider.  Eat more yogurt. This may help to keep your yeast infection from returning.  Do not use tampons until your health care provider approves.  Try taking a sitz bath to help with discomfort. This is a warm water bath that is taken while you are sitting down. The water should only come up to your hips and should cover your buttocks. Do this 3-4 times per day or as told by your health care provider.  Do not douche.  If you have diabetes, keep your blood sugar levels under control.  Keep all follow-up visits as told by your health care provider. This is important. Contact a health care provider if:  You have a fever.  Your symptoms go away and then return.  Your symptoms do not get better with treatment.  Your symptoms get worse.  You have new symptoms.  You develop blisters in or around your vagina.  You have blood coming from your vagina and it is not your menstrual period.  You develop pain in your abdomen. Summary  Vaginal yeast infection is a condition that causes discharge as well as soreness, swelling, and redness (inflammation) of the vagina.  This condition is treated with medicine. Medicines may be over-the-counter or prescription.  Take or apply over-the-counter and prescription medicines only as told by your health care provider.  Do not douche.   Do not have sex or use tampons until your health care provider approves.  Contact a health care provider if your symptoms do not get better with treatment or your symptoms go away and then return. This information is not intended to replace advice given to you by your health care provider. Make sure you discuss any questions you have with your health care  provider. Document Released: 03/14/2005 Document Revised: 10/21/2017 Document Reviewed: 10/21/2017 Elsevier Patient Education  2020 Elsevier Inc.  

## 2019-05-25 NOTE — MAU Provider Note (Signed)
History     CSN: [id number]  Arrival date and time: 05/25/19 1310   First Provider Initiated Contact with Patient 05/25/19 1410      Chief Complaint  Patient presents with  . Vaginal Bleeding   23 y.o. G2P1001 '@25' .3 wks with mono-di twins presenting with spotting. First noticed this am. Describes as brown, pink, and dark red when she wipes. Denies LOF and ctx. +FM x2. Had IC last night, denied any pain.   OB History    Gravida  2   Para  1   Term  1   Preterm      AB      Living  1     SAB      TAB      Ectopic      Multiple  0   Live Births  1           Past Medical History:  Diagnosis Date  . Anemia   . Bronchitis   . Chlamydia 02/19/2017  . Gonorrhea 2019  . Neutropenia [facility])     Past Surgical History:  Procedure Laterality Date  . NO PAST SURGERIES      Family History  Problem Relation Age of Onset  . Hypertension Mother   . Diabetes Other     Social History   Tobacco Use  . Smoking status: Never Smoker  . Smokeless tobacco: Never Used  Substance Use Topics  . Alcohol use: No  . Drug use: Not Currently    Types: Marijuana    Comment: last smoked 2 weeks from August 28th    Allergies: No Known Allergies  Medications Prior to Admission  Medication Sig Dispense Refill Last Dose  . aspirin EC 81 MG tablet Take 1 tablet (81 mg total) by mouth daily. 60 tablet 2 05/24/2019 at Unknown time  . Blood Pressure Monitor KIT Please check blood pressure 1-2 times per week 1 kit 0 Past Week at Unknown time  . Cholecalciferol (VITAMIN D) 50 MCG (2000 UT) tablet Take 1 tablet (2,000 Units total) by mouth daily. 60 tablet 6 05/24/2019 at Unknown time  . docusate sodium (COLACE) 100 MG capsule Take 1 capsule (100 mg total) by mouth 2 (two) times daily as needed for mild constipation. 60 capsule 0 05/24/2019 at Unknown time  . ferrous fumarate (HEMOCYTE - 106 MG FE) 325 (106 Fe) MG TABS tablet Take 1 tablet (106 mg of iron total) by mouth 2 (two) times  daily. 30 tablet 5 05/24/2019 at Unknown time  . metoCLOPramide (REGLAN) 10 MG tablet Take 1 tablet (10 mg total) by mouth every 6 (six) hours. 30 tablet 2 05/24/2019 at Unknown time  . polyethylene glycol (MIRALAX / GLYCOLAX) 17 g packet Take 17 g by mouth 2 (two) times daily as needed (constipation). 30 each 1 Past Month at Unknown time  . Prenatal Vit-Fe Fumarate-FA (PRENATAL VITAMIN PO) Take by mouth.   05/24/2019 at Unknown time  . butalbital-acetaminophen-caffeine (FIORICET) 50-325-40 MG tablet Take 1-2 tablets by mouth every 6 (six) hours as needed for headache. (Patient not taking: Reported on 05/20/2019) 20 tablet 0   . folic acid (FOLVITE) 1 MG tablet Take 1 tablet (1 mg total) by mouth daily. (Patient not taking: Reported on 02/25/2019) 60 tablet 3     Review of Systems  Gastrointestinal: Negative for abdominal pain.  Genitourinary: Positive for vaginal bleeding. Negative for dysuria, frequency and urgency.   Physical Exam   Blood pressure (!) 118/53, pulse 66, temperature  98.3 F (36.8 C), temperature source Oral, resp. rate 16, height '5\' 4"'  (1.626 m), weight 69.5 kg, last menstrual period 11/28/2018, SpO2 100 %.  Physical Exam  Nursing note and vitals reviewed. Constitutional: She is oriented to person, place, and time. She appears well-developed and well-nourished.  HENT:  Head: Normocephalic and atraumatic.  Neck: Normal range of motion.  Cardiovascular: Normal rate.  Respiratory: Effort normal. No respiratory distress.  GI: Soft. She exhibits no distension. There is no abdominal tenderness.  gravid  Genitourinary:    Genitourinary Comments: External: no lesions or erythema Vagina: rugated, pink, moist, white curdy discharge, no blood SVE closed/long   Musculoskeletal: Normal range of motion.  Neurological: She is alert and oriented to person, place, and time.  Skin: Skin is warm and dry.  Psychiatric: She has a normal mood and affect.  EFM: 135 bpm, mod variability, no  accels, no decels EFM: 140 bpm, mod variability, + accels, no decels Toco: irritability  Results for orders placed or performed during the hospital encounter of 05/25/19 (from the past 24 hour(s))  Wet prep, genital     Status: Abnormal   Collection Time: 05/25/19  2:23 PM   Specimen: Cervix  Result Value Ref Range   Yeast Wet Prep HPF POC PRESENT (A) NONE SEEN   Trich, Wet Prep NONE SEEN NONE SEEN   Clue Cells Wet Prep HPF POC NONE SEEN NONE SEEN   WBC, Wet Prep HPF POC MANY (A) NONE SEEN   Sperm NONE SEEN   Urinalysis, Routine w reflex microscopic     Status: Abnormal   Collection Time: 05/25/19  2:41 PM  Result Value Ref Range   Color, Urine YELLOW YELLOW   APPearance CLOUDY (A) CLEAR   Specific Gravity, Urine 1.018 1.005 - 1.030   pH 8.0 5.0 - 8.0   Glucose, UA NEGATIVE NEGATIVE mg/dL   Hgb urine dipstick SMALL (A) NEGATIVE   Bilirubin Urine NEGATIVE NEGATIVE   Ketones, ur NEGATIVE NEGATIVE mg/dL   Protein, ur 30 (A) NEGATIVE mg/dL   Nitrite NEGATIVE NEGATIVE   Leukocytes,Ua LARGE (A) NEGATIVE   RBC / HPF 0-5 0 - 5 RBC/hpf   WBC, UA 21-50 0 - 5 WBC/hpf   Bacteria, UA FEW (A) NONE SEEN   Squamous Epithelial / LPF 11-20 0 - 5   Mucus PRESENT    Hyaline Casts, UA PRESENT    MAU Course  Procedures  MDM Labs ordered and reviewed. No evidence of PTL or abruption. Spotting likely d/t recent coitus and/or yeast inf. Stable for discharge home.  Assessment and Plan   1. [redacted] weeks gestation of pregnancy   2. Monochorionic diamniotic twin gestation in second trimester   3. Yeast vaginitis    Discharge home Follow up at [place] as scheduled Rx Terazol PTL precautions  Allergies as of 05/25/2019   No Known Allergies     Medication List    TAKE these medications   aspirin EC 81 MG tablet Take 1 tablet (81 mg total) by mouth daily.   Blood Pressure Monitor Kit Please check blood pressure 1-2 times per week   butalbital-acetaminophen-caffeine 50-325-40 MG  tablet Commonly known as: FIORICET Take 1-2 tablets by mouth every 6 (six) hours as needed for headache.   docusate sodium 100 MG capsule Commonly known as: COLACE Take 1 capsule (100 mg total) by mouth 2 (two) times daily as needed for mild constipation.   ferrous fumarate 325 (106 Fe) MG Tabs tablet Commonly known as: HEMOCYTE -  106 mg FE Take 1 tablet (106 mg of iron total) by mouth 2 (two) times daily.   folic acid 1 MG tablet Commonly known as: FOLVITE Take 1 tablet (1 mg total) by mouth daily.   metoCLOPramide 10 MG tablet Commonly known as: REGLAN Take 1 tablet (10 mg total) by mouth every 6 (six) hours.   polyethylene glycol 17 g packet Commonly known as: MIRALAX / GLYCOLAX Take 17 g by mouth 2 (two) times daily as needed (constipation).   PRENATAL VITAMIN PO Take by mouth.   terconazole 0.4 % vaginal cream Commonly known as: TERAZOL 7 Place 1 applicator vaginally at bedtime.   Vitamin D 50 MCG (2000 UT) tablet Take 1 tablet (2,000 Units total) by mouth daily.      Julianne Handler, CNM 05/25/2019, 3:18 PM

## 2019-05-26 LAB — GC/CHLAMYDIA PROBE AMP (~~LOC~~) NOT AT ARMC
Chlamydia: NEGATIVE
Comment: NEGATIVE
Comment: NORMAL
Neisseria Gonorrhea: NEGATIVE

## 2019-05-27 ENCOUNTER — Ambulatory Visit (HOSPITAL_COMMUNITY)
Admission: RE | Admit: 2019-05-27 | Discharge: 2019-05-27 | Disposition: A | Discharge status: Home / Self Care | Payer: Medicaid Other | Source: Ambulatory Visit | Attending: Obstetrics | Admitting: Obstetrics

## 2019-05-27 ENCOUNTER — Ambulatory Visit (HOSPITAL_COMMUNITY): Payer: Medicaid Other | Admitting: *Deleted

## 2019-05-27 ENCOUNTER — Other Ambulatory Visit: Payer: Self-pay

## 2019-05-27 ENCOUNTER — Other Ambulatory Visit (HOSPITAL_COMMUNITY): Payer: Self-pay | Admitting: *Deleted

## 2019-05-27 ENCOUNTER — Encounter (HOSPITAL_COMMUNITY): Payer: Self-pay

## 2019-05-27 DIAGNOSIS — O30032 Twin pregnancy, monochorionic/diamniotic, second trimester: Secondary | ICD-10-CM | POA: Insufficient documentation

## 2019-05-27 DIAGNOSIS — O30039 Twin pregnancy, monochorionic/diamniotic, unspecified trimester: Secondary | ICD-10-CM

## 2019-05-27 DIAGNOSIS — O409XX0 Polyhydramnios, unspecified trimester, not applicable or unspecified: Secondary | ICD-10-CM | POA: Insufficient documentation

## 2019-05-27 DIAGNOSIS — Z3A24 24 weeks gestation of pregnancy: Secondary | ICD-10-CM | POA: Diagnosis not present

## 2019-05-27 DIAGNOSIS — Z362 Encounter for other antenatal screening follow-up: Secondary | ICD-10-CM | POA: Diagnosis not present

## 2019-06-08 ENCOUNTER — Telehealth: Payer: Self-pay | Admitting: *Deleted

## 2019-06-08 ENCOUNTER — Ambulatory Visit: Payer: Medicaid Other | Attending: Internal Medicine

## 2019-06-08 DIAGNOSIS — Z20822 Contact with and (suspected) exposure to covid-19: Secondary | ICD-10-CM

## 2019-06-08 NOTE — Telephone Encounter (Signed)
Pt was tested for COVID this afternoon and called just to make sure she would get a call either way if she is negative or positive. Does not want to access MyChart. Reassured she would be notified. Fara Olden, RN-C

## 2019-06-09 LAB — NOVEL CORONAVIRUS, NAA: SARS-CoV-2, NAA: NOT DETECTED

## 2019-06-10 ENCOUNTER — Encounter (HOSPITAL_COMMUNITY): Payer: Self-pay

## 2019-06-10 ENCOUNTER — Inpatient Hospital Stay (HOSPITAL_COMMUNITY): Payer: Medicaid Other | Admitting: Anesthesiology

## 2019-06-10 ENCOUNTER — Inpatient Hospital Stay (HOSPITAL_COMMUNITY): Payer: Medicaid Other

## 2019-06-10 ENCOUNTER — Encounter (HOSPITAL_COMMUNITY): Payer: Self-pay | Admitting: Obstetrics and Gynecology

## 2019-06-10 ENCOUNTER — Inpatient Hospital Stay (HOSPITAL_COMMUNITY)
Admission: AD | Admit: 2019-06-10 | Discharge: 2019-06-13 | DRG: 788 | Disposition: A | Discharge status: Home / Self Care | Payer: Medicaid Other | Attending: Obstetrics and Gynecology | Admitting: Obstetrics and Gynecology

## 2019-06-10 ENCOUNTER — Other Ambulatory Visit: Payer: Self-pay

## 2019-06-10 ENCOUNTER — Encounter (HOSPITAL_COMMUNITY)
Admission: AD | Disposition: A | Discharge status: Home / Self Care | Payer: Self-pay | Source: Home / Self Care | Attending: Obstetrics and Gynecology

## 2019-06-10 ENCOUNTER — Ambulatory Visit (EMERGENCY_DEPARTMENT_HOSPITAL)
Admission: RE | Admit: 2019-06-10 | Discharge: 2019-06-10 | Disposition: A | Discharge status: Home / Self Care | Payer: Medicaid Other | Source: Ambulatory Visit | Attending: Obstetrics and Gynecology | Admitting: Obstetrics and Gynecology

## 2019-06-10 ENCOUNTER — Ambulatory Visit (HOSPITAL_COMMUNITY): Payer: Medicaid Other | Admitting: *Deleted

## 2019-06-10 DIAGNOSIS — R109 Unspecified abdominal pain: Secondary | ICD-10-CM

## 2019-06-10 DIAGNOSIS — O322XX2 Maternal care for transverse and oblique lie, fetus 2: Secondary | ICD-10-CM

## 2019-06-10 DIAGNOSIS — O9902 Anemia complicating childbirth: Secondary | ICD-10-CM | POA: Diagnosis present

## 2019-06-10 DIAGNOSIS — O30039 Twin pregnancy, monochorionic/diamniotic, unspecified trimester: Secondary | ICD-10-CM

## 2019-06-10 DIAGNOSIS — O30032 Twin pregnancy, monochorionic/diamniotic, second trimester: Secondary | ICD-10-CM

## 2019-06-10 DIAGNOSIS — O09292 Supervision of pregnancy with other poor reproductive or obstetric history, second trimester: Secondary | ICD-10-CM

## 2019-06-10 DIAGNOSIS — Z283 Underimmunization status: Secondary | ICD-10-CM

## 2019-06-10 DIAGNOSIS — O09899 Supervision of other high risk pregnancies, unspecified trimester: Secondary | ICD-10-CM

## 2019-06-10 DIAGNOSIS — O402XX2 Polyhydramnios, second trimester, fetus 2: Secondary | ICD-10-CM | POA: Diagnosis present

## 2019-06-10 DIAGNOSIS — O402XX1 Polyhydramnios, second trimester, fetus 1: Secondary | ICD-10-CM | POA: Diagnosis present

## 2019-06-10 DIAGNOSIS — O2340 Unspecified infection of urinary tract in pregnancy, unspecified trimester: Secondary | ICD-10-CM | POA: Diagnosis present

## 2019-06-10 DIAGNOSIS — D649 Anemia, unspecified: Secondary | ICD-10-CM | POA: Diagnosis present

## 2019-06-10 DIAGNOSIS — Z98891 History of uterine scar from previous surgery: Secondary | ICD-10-CM

## 2019-06-10 DIAGNOSIS — Z3A27 27 weeks gestation of pregnancy: Secondary | ICD-10-CM

## 2019-06-10 DIAGNOSIS — O328XX1 Maternal care for other malpresentation of fetus, fetus 1: Secondary | ICD-10-CM

## 2019-06-10 DIAGNOSIS — O409XX0 Polyhydramnios, unspecified trimester, not applicable or unspecified: Secondary | ICD-10-CM | POA: Insufficient documentation

## 2019-06-10 DIAGNOSIS — O26899 Other specified pregnancy related conditions, unspecified trimester: Secondary | ICD-10-CM

## 2019-06-10 DIAGNOSIS — B951 Streptococcus, group B, as the cause of diseases classified elsewhere: Secondary | ICD-10-CM | POA: Diagnosis present

## 2019-06-10 LAB — CBC
HCT: 33.4 % — ABNORMAL LOW (ref 36.0–46.0)
Hemoglobin: 11.2 g/dL — ABNORMAL LOW (ref 12.0–15.0)
MCH: 30.8 pg (ref 26.0–34.0)
MCHC: 33.5 g/dL (ref 30.0–36.0)
MCV: 91.8 fL (ref 80.0–100.0)
Platelets: 163 10*3/uL (ref 150–400)
RBC: 3.64 MIL/uL — ABNORMAL LOW (ref 3.87–5.11)
RDW: 12.4 % (ref 11.5–15.5)
WBC: 9.9 10*3/uL (ref 4.0–10.5)
nRBC: 0 % (ref 0.0–0.2)

## 2019-06-10 LAB — URINALYSIS, ROUTINE W REFLEX MICROSCOPIC
Bilirubin Urine: NEGATIVE
Glucose, UA: NEGATIVE mg/dL
Ketones, ur: NEGATIVE mg/dL
Nitrite: NEGATIVE
Protein, ur: NEGATIVE mg/dL
Specific Gravity, Urine: 1.015 (ref 1.005–1.030)
pH: 7 (ref 5.0–8.0)

## 2019-06-10 LAB — TYPE AND SCREEN
ABO/RH(D): O POS
Antibody Screen: NEGATIVE

## 2019-06-10 LAB — ABO/RH: ABO/RH(D): O POS

## 2019-06-10 SURGERY — Surgical Case
Anesthesia: Spinal

## 2019-06-10 MED ORDER — BUPIVACAINE IN DEXTROSE 0.75-8.25 % IT SOLN
INTRATHECAL | Status: DC | PRN
  Administered 2019-06-10: 1.5 mL via INTRATHECAL

## 2019-06-10 MED ORDER — NALBUPHINE HCL 10 MG/ML IJ SOLN
5.0000 mg | INTRAMUSCULAR | Status: DC | PRN
  Administered 2019-06-11: 5 mg via INTRAVENOUS
  Filled 2019-06-10: qty 1

## 2019-06-10 MED ORDER — ONDANSETRON HCL 4 MG/2ML IJ SOLN
INTRAMUSCULAR | Status: AC
  Filled 2019-06-10: qty 2

## 2019-06-10 MED ORDER — NALBUPHINE HCL 10 MG/ML IJ SOLN
INTRAMUSCULAR | Status: AC
  Filled 2019-06-10: qty 1

## 2019-06-10 MED ORDER — PHENYLEPHRINE HCL-NACL 20-0.9 MG/250ML-% IV SOLN
INTRAVENOUS | Status: DC | PRN
  Administered 2019-06-10: 60 ug/min via INTRAVENOUS

## 2019-06-10 MED ORDER — PROMETHAZINE HCL 25 MG/ML IJ SOLN: 6.2500 mg | INTRAMUSCULAR | Status: DC | PRN

## 2019-06-10 MED ORDER — DIPHENHYDRAMINE HCL 50 MG/ML IJ SOLN: 12.5000 mg | INTRAMUSCULAR | Status: DC | PRN

## 2019-06-10 MED ORDER — SUCCINYLCHOLINE CHLORIDE 200 MG/10ML IV SOSY
PREFILLED_SYRINGE | INTRAVENOUS | Status: AC
  Filled 2019-06-10: qty 10

## 2019-06-10 MED ORDER — DEXAMETHASONE SODIUM PHOSPHATE 10 MG/ML IJ SOLN
INTRAMUSCULAR | Status: DC | PRN
  Administered 2019-06-10: 10 mg via INTRAVENOUS

## 2019-06-10 MED ORDER — LACTATED RINGERS IV SOLN: INTRAVENOUS | Status: DC

## 2019-06-10 MED ORDER — PROPOFOL 10 MG/ML IV BOLUS
INTRAVENOUS | Status: AC
  Filled 2019-06-10: qty 20

## 2019-06-10 MED ORDER — FENTANYL CITRATE (PF) 100 MCG/2ML IJ SOLN
INTRAMUSCULAR | Status: DC | PRN
  Administered 2019-06-10: 15 ug via INTRAVENOUS

## 2019-06-10 MED ORDER — KETOROLAC TROMETHAMINE 30 MG/ML IJ SOLN
INTRAMUSCULAR | Status: AC
  Filled 2019-06-10: qty 1

## 2019-06-10 MED ORDER — BETAMETHASONE SOD PHOS & ACET 6 (3-3) MG/ML IJ SUSP
12.0000 mg | INTRAMUSCULAR | Status: DC
  Administered 2019-06-10: 12 mg via INTRAMUSCULAR
  Filled 2019-06-10: qty 5

## 2019-06-10 MED ORDER — ACETAMINOPHEN 500 MG PO TABS
1000.0000 mg | ORAL_TABLET | Freq: Four times a day (QID) | ORAL | Status: AC
  Administered 2019-06-11 (×2): 1000 mg via ORAL
  Filled 2019-06-10 (×2): qty 2

## 2019-06-10 MED ORDER — NALOXONE HCL 0.4 MG/ML IJ SOLN: 0.4000 mg | INTRAMUSCULAR | Status: DC | PRN

## 2019-06-10 MED ORDER — CEFAZOLIN SODIUM-DEXTROSE 2-4 GM/100ML-% IV SOLN: 2.0000 g | Freq: Once | INTRAVENOUS | Status: DC

## 2019-06-10 MED ORDER — MEPERIDINE HCL 25 MG/ML IJ SOLN: 6.2500 mg | INTRAMUSCULAR | Status: DC | PRN

## 2019-06-10 MED ORDER — MORPHINE SULFATE (PF) 0.5 MG/ML IJ SOLN
INTRAMUSCULAR | Status: AC
  Filled 2019-06-10: qty 10

## 2019-06-10 MED ORDER — FENTANYL CITRATE (PF) 100 MCG/2ML IJ SOLN
INTRAMUSCULAR | Status: AC
  Filled 2019-06-10: qty 2

## 2019-06-10 MED ORDER — PHENYLEPHRINE HCL-NACL 20-0.9 MG/250ML-% IV SOLN
INTRAVENOUS | Status: AC
  Filled 2019-06-10: qty 250

## 2019-06-10 MED ORDER — DEXAMETHASONE SODIUM PHOSPHATE 10 MG/ML IJ SOLN
INTRAMUSCULAR | Status: AC
  Filled 2019-06-10: qty 1

## 2019-06-10 MED ORDER — CEFAZOLIN SODIUM-DEXTROSE 2-4 GM/100ML-% IV SOLN
INTRAVENOUS | Status: AC
  Filled 2019-06-10: qty 100

## 2019-06-10 MED ORDER — NALBUPHINE HCL 10 MG/ML IJ SOLN: 5.0000 mg | Freq: Once | INTRAMUSCULAR | Status: AC | PRN

## 2019-06-10 MED ORDER — NALBUPHINE HCL 10 MG/ML IJ SOLN
5.0000 mg | INTRAMUSCULAR | Status: DC | PRN
  Administered 2019-06-11: 5 mg via SUBCUTANEOUS
  Filled 2019-06-10: qty 1

## 2019-06-10 MED ORDER — KETOROLAC TROMETHAMINE 30 MG/ML IJ SOLN: 30.0000 mg | Freq: Four times a day (QID) | INTRAMUSCULAR | Status: AC | PRN

## 2019-06-10 MED ORDER — DIPHENHYDRAMINE HCL 25 MG PO CAPS: 25.0000 mg | ORAL_CAPSULE | ORAL | Status: DC | PRN

## 2019-06-10 MED ORDER — SOD CITRATE-CITRIC ACID 500-334 MG/5ML PO SOLN
ORAL | Status: AC
  Administered 2019-06-10: 20:00:00 30 mL via ORAL
  Filled 2019-06-10: qty 30

## 2019-06-10 MED ORDER — KETOROLAC TROMETHAMINE 30 MG/ML IJ SOLN
30.0000 mg | Freq: Once | INTRAMUSCULAR | Status: AC | PRN
  Administered 2019-06-10: 30 mg via INTRAVENOUS

## 2019-06-10 MED ORDER — SCOPOLAMINE 1 MG/3DAYS TD PT72: 1.0000 | MEDICATED_PATCH | Freq: Once | TRANSDERMAL | Status: DC

## 2019-06-10 MED ORDER — SOD CITRATE-CITRIC ACID 500-334 MG/5ML PO SOLN: 30.0000 mL | Freq: Once | ORAL | Status: AC

## 2019-06-10 MED ORDER — HYDROMORPHONE HCL 1 MG/ML IJ SOLN: 0.2500 mg | INTRAMUSCULAR | Status: DC | PRN

## 2019-06-10 MED ORDER — MORPHINE SULFATE (PF) 0.5 MG/ML IJ SOLN
INTRAMUSCULAR | Status: DC | PRN
  Administered 2019-06-10: .15 mg via EPIDURAL

## 2019-06-10 MED ORDER — ONDANSETRON HCL 4 MG/2ML IJ SOLN
4.0000 mg | Freq: Three times a day (TID) | INTRAMUSCULAR | Status: DC | PRN
  Administered 2019-06-11: 4 mg via INTRAVENOUS
  Filled 2019-06-10: qty 2

## 2019-06-10 MED ORDER — CEFAZOLIN SODIUM-DEXTROSE 2-3 GM-%(50ML) IV SOLR
INTRAVENOUS | Status: DC | PRN
  Administered 2019-06-10: 2 g via INTRAVENOUS

## 2019-06-10 MED ORDER — SODIUM CHLORIDE 0.9% FLUSH: 3.0000 mL | INTRAVENOUS | Status: DC | PRN

## 2019-06-10 MED ORDER — ONDANSETRON HCL 4 MG/2ML IJ SOLN
INTRAMUSCULAR | Status: DC | PRN
  Administered 2019-06-10: 4 mg via INTRAVENOUS

## 2019-06-10 MED ORDER — LACTATED RINGERS IV BOLUS: 1000.0000 mL | Freq: Once | INTRAVENOUS | Status: DC

## 2019-06-10 MED ORDER — NALOXONE HCL 4 MG/10ML IJ SOLN: 1.0000 ug/kg/h | INTRAVENOUS | Status: DC | PRN

## 2019-06-10 MED ORDER — OXYTOCIN 40 UNITS IN NORMAL SALINE INFUSION - SIMPLE MED
INTRAVENOUS | Status: AC
  Filled 2019-06-10: qty 1000

## 2019-06-10 MED ORDER — OXYTOCIN 40 UNITS IN NORMAL SALINE INFUSION - SIMPLE MED
INTRAVENOUS | Status: DC | PRN
  Administered 2019-06-10: 500 mL via INTRAVENOUS

## 2019-06-10 MED ORDER — NALBUPHINE HCL 10 MG/ML IJ SOLN
5.0000 mg | Freq: Once | INTRAMUSCULAR | Status: AC | PRN
  Administered 2019-06-10: 5 mg via INTRAVENOUS

## 2019-06-10 SURGICAL SUPPLY — 32 items
BENZOIN TINCTURE PRP APPL 2/3 (GAUZE/BANDAGES/DRESSINGS) ×3 IMPLANT
CHLORAPREP W/TINT 26ML (MISCELLANEOUS) ×3 IMPLANT
CLAMP CORD UMBIL (MISCELLANEOUS) IMPLANT
CLOSURE WOUND 1/2 X4 (GAUZE/BANDAGES/DRESSINGS) ×1
DRSG OPSITE POSTOP 4X10 (GAUZE/BANDAGES/DRESSINGS) ×3 IMPLANT
ELECT REM PT RETURN 9FT ADLT (ELECTROSURGICAL) ×3
ELECTRODE REM PT RTRN 9FT ADLT (ELECTROSURGICAL) ×1 IMPLANT
EXTRACTOR VACUUM M CUP 4 TUBE (SUCTIONS) IMPLANT
EXTRACTOR VACUUM M CUP 4' TUBE (SUCTIONS)
GLOVE BIOGEL PI IND STRL 6.5 (GLOVE) ×1 IMPLANT
GLOVE BIOGEL PI IND STRL 7.0 (GLOVE) ×1 IMPLANT
GLOVE BIOGEL PI INDICATOR 6.5 (GLOVE) ×2
GLOVE BIOGEL PI INDICATOR 7.0 (GLOVE) ×2
GLOVE SURG SS PI 6.0 STRL IVOR (GLOVE) ×3 IMPLANT
GOWN STRL REUS W/TWL LRG LVL3 (GOWN DISPOSABLE) ×6 IMPLANT
KIT ABG SYR 3ML LUER SLIP (SYRINGE) IMPLANT
NEEDLE HYPO 25X5/8 SAFETYGLIDE (NEEDLE) IMPLANT
NS IRRIG 1000ML POUR BTL (IV SOLUTION) ×3 IMPLANT
PACK C SECTION WH (CUSTOM PROCEDURE TRAY) ×3 IMPLANT
PAD ABD 7.5X8 STRL (GAUZE/BANDAGES/DRESSINGS) ×3 IMPLANT
PAD OB MATERNITY 4.3X12.25 (PERSONAL CARE ITEMS) ×3 IMPLANT
PENCIL SMOKE EVAC W/HOLSTER (ELECTROSURGICAL) ×3 IMPLANT
RTRCTR C-SECT PINK 25CM LRG (MISCELLANEOUS) IMPLANT
SEPRAFILM MEMBRANE 5X6 (MISCELLANEOUS) IMPLANT
SPONGE GAUZE 4X4 12PLY STER LF (GAUZE/BANDAGES/DRESSINGS) ×6 IMPLANT
STRIP CLOSURE SKIN 1/2X4 (GAUZE/BANDAGES/DRESSINGS) ×2 IMPLANT
SUT PLAIN 0 NONE (SUTURE) IMPLANT
SUT VIC AB 0 CT1 36 (SUTURE) ×12 IMPLANT
SUT VIC AB 4-0 KS 27 (SUTURE) ×3 IMPLANT
TOWEL OR 17X24 6PK STRL BLUE (TOWEL DISPOSABLE) ×3 IMPLANT
TRAY FOLEY W/BAG SLVR 14FR LF (SET/KITS/TRAYS/PACK) ×3 IMPLANT
WATER STERILE IRR 1000ML POUR (IV SOLUTION) ×6 IMPLANT

## 2019-06-10 NOTE — H&P (Addendum)
Kelli Soto is a 23 y.o. female at [redacted]w[redacted]d with mono-di twins presenting for preterm contractions. Patient states she has been painfully contracting since 2pm today. She states she was seen for an ultrasound around 245pm today and was told her babies were both breech. She endorses perineal pressure but denies urge to push. She denies vaginal bleeding, leaking of fluid, decreased fetal movement, fever, falls, or recent illness.   Patient states she has been NPO since 1pm due to worsening discomfort.  OB History     Gravida  2   Para  1   Term  1   Preterm      AB      Living  1      SAB      TAB      Ectopic      Multiple  0   Live Births  1          Past Medical History:  Diagnosis Date   Anemia    Bronchitis    Chlamydia 02/19/2017   Gonorrhea 2019   Neutropenia ([place])    Past Surgical History:  Procedure Laterality Date   NO PAST SURGERIES     Family History: family history includes Diabetes in an other family member; Hypertension in her mother. Social History:  reports that she has never smoked. She has never used smokeless tobacco. She reports previous drug use. Drug: Marijuana. She reports that she does not drink alcohol.  Patient Active Problem List   Diagnosis Date Noted   Polyhydramnios affecting pregnancy 05/24/2019   Low iron 02/10/2019   Supervision of high risk pregnancy, antepartum 02/10/2019   History of poor fetal growth 02/10/2019   Twin gestation, monochorionic diamniotic 02/10/2019   Low serum vitamin D 12/29/2018   Neutropenia ([place]) 12/29/2018     Maternal Diabetes: No Genetic Screening: Normal Maternal Ultrasounds/Referrals: Other: Polyhydramnios noted at 24 weeks Fetal Ultrasounds or other Referrals: Serial MFM mono-di twins Maternal Substance Abuse:  No Significant Maternal Medications:  None Significant Maternal Lab Results:  Other:  GBS unknown Other Comments:  None  Review of Systems  Gastrointestinal: Positive for  abdominal pain.  Genitourinary: Positive for vaginal pain.  Musculoskeletal: Positive for back pain.  All other systems reviewed and are negative.  Blood pressure (!) 121/53, pulse 73, temperature 97.7 F (36.5 C), temperature source Oral, last menstrual period 11/28/2018.   Physical Exam  Nursing note and vitals reviewed. Constitutional: She is oriented to person, place, and time. She appears well-developed and well-nourished.  Cardiovascular: Normal rate.  Respiratory: Effort normal and breath sounds normal.  GI: She exhibits no distension. There is no abdominal tenderness. There is no rebound and no guarding.  Gravid  Neurological: She is alert and oriented to person, place, and time.  Skin: Skin is dry.  Psychiatric: She has a normal mood and affect. Her behavior is normal. Judgment and thought content normal.    Prenatal labs: ABO, Rh: O/Positive/-- (08/25 1049) Antibody: Negative (08/25 1049) Rubella: <0.90 (08/25 1049) RPR: Non Reactive (08/25 1049)  HBsAg: Negative (08/25 1049)  HIV: Non Reactive (08/25 1049)  GBS:   Unknown COVID negative two days ago  Assessment/Plan: --Bulging membranes and complete cervical dilation noted on speculum exam --Drs Rip Harbour and Kirsti Mcalpine notified of labor status, pending bedside ultrasound --Dr. Elly Modena inbound to hospital --Reassuring fetal status via EFM and bedside ultrasound, 1 minute bradycardia noted --Baby A footling breech, Baby B transverse with head to maternal right per formal bedside scan --  Dr. Jolayne Panther, Faculty Backup Attending given update of labor status and fetal positioning --Patient escorted to OR as Dr Jolayne Panther arrived to [facility].  --Patient escorted to Periop hallway by MAU staff, House Coverage present throughout  Calvert Cantor, [place] 06/10/2019, 9:00 PM   Risks, benefits and alternatives were explained including but not limited to risks of bleeding, infection and damage to adjacent organs. Patient verbalized  understanding and all questions were answered. Consent signed. Patient undecided on contraception method

## 2019-06-10 NOTE — Transfer of Care (Signed)
Immediate Anesthesia Transfer of Care Note  Patient: Kelli Soto  Procedure(s) Performed: CESAREAN SECTION (N/A )  Patient Location: PACU  Anesthesia Type:Spinal  Level of Consciousness: awake, alert  and oriented  Airway & Oxygen Therapy: Patient Spontanous Breathing  Post-op Assessment: Report given to RN and Post -op Vital signs reviewed and stable  Post vital signs: Reviewed and stable  Last Vitals:  Vitals Value Taken Time  BP    Temp    Pulse    Resp    SpO2      Last Pain:  Vitals:   06/10/19 1936  TempSrc: Oral  PainSc:       Patients Stated Pain Goal: 0 (11/94/17 4081)  Complications: No apparent anesthesia complications

## 2019-06-10 NOTE — MAU Note (Signed)
OB OR Museum/gallery curator.  NICU charge nurse notified.  Charge Nurse notified. OB Anesthesiologist notified.  Dr. Elly Modena notified.    [place] confirmed baby A is breech and B is transverse.

## 2019-06-10 NOTE — Consult Note (Signed)
Neonatology Note:   Attendance at C-section:    I was asked by Dr. Roland Rack to attend this urgent C/S at 27.5 weeks with mono-di twin girls for PTL with decels. The mother is a G2P1001, GBS unnk with good prenatal care.  Prior h/o THC use and Gon/Chlamydia.   Twin A:  ROM at delivery.  Fluid clear.  Feet first.  Infant not vigorous nor with good spontaneous cry and tone so cord immediately cut and infant brought to warming bag in Resuscitation room. Dried, stimulated and HR checked at ~60 bpm.  PPV initiated with good response.  Resp response gradually improved so able to switch to CPAP 6cm and titrate fio2 according to [place].  Ap 4/8. Lungs clearing to ausc in DR, infant pink, on ~40% fio2. Mother and MGF updated. Transported via shuttle without issues.    Monia Sabal Katherina Mires, MD Neonatologist 06/10/2019, 9:29 PM

## 2019-06-10 NOTE — Progress Notes (Signed)
Pt to have bedside u/s to determine fetal presentation. If fetal presentation of baby A vertex, pt to be admitted to L&D. If presentation of baby A not vertex, pt to go to OR for cesarean section delivery. Pt aware of plan of care. L&D charge RN and OR charge RN notified of plan of care.

## 2019-06-10 NOTE — MAU Note (Signed)
Patient reports to MAU via EMS c/o a constant abdominal pain and pelvic pressure. Pt reports the pain is a 10/10. At [place] she noticed some bright red spotting. Pt reports a DFM since the pain started. No LOF.

## 2019-06-10 NOTE — Anesthesia Preprocedure Evaluation (Signed)
Anesthesia Evaluation  Patient identified by MRN, date of birth, ID band Patient awake    Reviewed: Allergy & Precautions, H&P , NPO status , Patient's Chart, lab work & pertinent test results  Airway Mallampati: II  TM Distance: >3 FB Neck ROM: full    Dental no notable dental hx. (+) Teeth Intact   Pulmonary neg pulmonary ROS,    Pulmonary exam normal breath sounds clear to auscultation       Cardiovascular negative cardio ROS Normal cardiovascular exam Rhythm:regular Rate:Normal     Neuro/Psych negative neurological ROS  negative psych ROS   GI/Hepatic negative GI ROS, Neg liver ROS,   Endo/Other  negative endocrine ROS  Renal/GU negative Renal ROS  negative genitourinary   Musculoskeletal negative musculoskeletal ROS (+)   Abdominal (+) + obese,   Peds  Hematology  (+) Blood dyscrasia, anemia ,   Anesthesia Other Findings   Reproductive/Obstetrics (+) Pregnancy                             Anesthesia Physical Anesthesia Plan  ASA: II and emergent  Anesthesia Plan: Spinal   Post-op Pain Management:    Induction:   PONV Risk Score and Plan: 3 and Ondansetron, Dexamethasone and Scopolamine patch - Pre-op  Airway Management Planned: Nasal Cannula, Natural Airway and Simple Face Mask  Additional Equipment: None  Intra-op Plan:   Post-operative Plan:   Informed Consent: I have reviewed the patients History and Physical, chart, labs and discussed the procedure including the risks, benefits and alternatives for the proposed anesthesia with the patient or authorized representative who has indicated his/her understanding and acceptance.       Plan Discussed with: CRNA  Anesthesia Plan Comments:         Anesthesia Quick Evaluation

## 2019-06-10 NOTE — Op Note (Signed)
Kelli Soto PROCEDURE DATE: 06/10/2019  PREOPERATIVE DIAGNOSIS: Intrauterine pregnancy at  [redacted]w[redacted]d weeks gestation; mono-di twin pregnancy in labor with fetal malpresentation  POSTOPERATIVE DIAGNOSIS: The same  PROCEDURE:     Cesarean Section  SURGEON:  Dr. Mora Bellman  ASSISTANT: Dr. Rip Harbour  INDICATIONS: Kelli Soto is a 23 y.o. G2P1001 at [redacted]w[redacted]d scheduled for cesarean section secondary to mono-di twin in labor with fetal malpresentation.  The risks of cesarean section discussed with the patient included but were not limited to: bleeding which may require transfusion or reoperation; infection which may require antibiotics; injury to bowel, bladder, ureters or other surrounding organs; injury to the fetus; need for additional procedures including hysterectomy in the event of a life-threatening hemorrhage; placental abnormalities wth subsequent pregnancies, incisional problems, thromboembolic phenomenon and other postoperative/anesthesia complications. The patient concurred with the proposed plan, giving informed written consent for the procedure.    FINDINGS:  Viable female infants in breech presentation x 2 .  Apgars and weight not available at the time of the note.  Clear amniotic fluid x 2.  Intact placenta, three vessel cord x 2.  Normal uterus, fallopian tubes and ovaries bilaterally.  ANESTHESIA:    Spinal INTRAVENOUS FLUIDS:2500 ml ESTIMATED BLOOD LOSS: 709 ml URINE OUTPUT:  200 ml SPECIMENS: Placenta sent to pathology/L&D COMPLICATIONS: None immediate  PROCEDURE IN DETAIL:  The patient received intravenous antibiotics and had sequential compression devices applied to her lower extremities while in the preoperative area.  She was then taken to the operating room where anesthesia was induced and was found to be adequate. A foley catheter was placed into her bladder and attached to [name] gravity. She was then placed in a dorsal supine position with a leftward tilt, and prepped  and draped in a sterile manner. After an adequate timeout was performed, a Pfannenstiel skin incision was made with scalpel and carried through to the underlying layer of fascia. The fascia was incised in the midline and this incision was extended bilaterally using the Mayo scissors. Kocher clamps were applied to the superior aspect of the fascial incision and the underlying rectus muscles were dissected off bluntly. A similar process was carried out on the inferior aspect of the facial incision. The rectus muscles were separated in the midline bluntly and the peritoneum was entered bluntly. The Alexis self-retaining retractor was introduced into the abdominal cavity. Attention was turned to the lower uterine segment where a  transverse hysterotomy was made with a scalpel and extended bilaterally bluntly. The infants were successfully delivered, and cord was clamped and cut and infants were handed over to awaiting neonatology team. Uterine massage was then administered and the placenta delivered intact with three-vessel cord. The uterus was cleared of clot and debris.  The hysterotomy was closed with 0 Vicryl in a running locked fashion, and an imbricating layer was also placed with a 0 Vicryl. Overall, excellent hemostasis was noted. The pelvis copiously irrigated and cleared of all clot and debris. Hemostasis was confirmed on all surfaces.  The peritoneum and the muscles were reapproximated using 0 vicryl interrupted stitches. The fascia was then closed using 0 Vicryl in a running fashion.  The skin was closed in a subcuticular fashion using 3.0 Vicryl. The patient tolerated the procedure well. Sponge, lap, instrument and needle counts were correct x 2. She was taken to the recovery room in stable condition.    Pavel Gadd ConstantMD  06/10/2019 9:21 PM

## 2019-06-10 NOTE — Anesthesia Procedure Notes (Signed)
Spinal  Patient location during procedure: OR Start time: 06/10/2019 8:35 PM End time: 06/10/2019 8:38 PM Staffing Performed: anesthesiologist  Anesthesiologist: Lyn Hollingshead, MD Preanesthetic Checklist Completed: patient identified, IV checked, site marked, risks and benefits discussed, surgical consent, monitors and equipment checked, pre-op evaluation and timeout performed Spinal Block Patient position: sitting Prep: DuraPrep and site prepped and draped Patient monitoring: continuous pulse ox and blood pressure Approach: midline Location: L3-4 Injection technique: single-shot Needle Needle type: Pencan  Needle gauge: 24 G Needle length: 10 cm Needle insertion depth: 5 cm Assessment Sensory level: T4

## 2019-06-11 ENCOUNTER — Encounter (HOSPITAL_COMMUNITY): Payer: Self-pay | Admitting: Obstetrics and Gynecology

## 2019-06-11 DIAGNOSIS — Z98891 History of uterine scar from previous surgery: Secondary | ICD-10-CM

## 2019-06-11 LAB — CBC
HCT: 28.4 % — ABNORMAL LOW (ref 36.0–46.0)
Hemoglobin: 9.7 g/dL — ABNORMAL LOW (ref 12.0–15.0)
MCH: 30.5 pg (ref 26.0–34.0)
MCHC: 34.2 g/dL (ref 30.0–36.0)
MCV: 89.3 fL (ref 80.0–100.0)
Platelets: 158 10*3/uL (ref 150–400)
RBC: 3.18 MIL/uL — ABNORMAL LOW (ref 3.87–5.11)
RDW: 12.2 % (ref 11.5–15.5)
WBC: 16.1 10*3/uL — ABNORMAL HIGH (ref 4.0–10.5)
nRBC: 0 % (ref 0.0–0.2)

## 2019-06-11 LAB — RPR: RPR Ser Ql: NONREACTIVE

## 2019-06-11 LAB — CREATININE, SERUM
Creatinine, Ser: 0.55 mg/dL (ref 0.44–1.00)
GFR calc Af Amer: >60 mL/min (ref >60)
GFR calc non Af Amer: >60 mL/min (ref >60)

## 2019-06-11 MED ORDER — OXYTOCIN 40 UNITS IN NORMAL SALINE INFUSION - SIMPLE MED: 2.5000 [IU]/h | INTRAVENOUS | Status: AC

## 2019-06-11 MED ORDER — DIPHENHYDRAMINE HCL 25 MG PO CAPS
25.0000 mg | ORAL_CAPSULE | Freq: Four times a day (QID) | ORAL | Status: DC | PRN
  Administered 2019-06-11 (×2): 25 mg via ORAL
  Filled 2019-06-11 (×2): qty 1

## 2019-06-11 MED ORDER — ZOLPIDEM TARTRATE 5 MG PO TABS: 5.0000 mg | ORAL_TABLET | Freq: Every evening | ORAL | Status: DC | PRN

## 2019-06-11 MED ORDER — SENNOSIDES-DOCUSATE SODIUM 8.6-50 MG PO TABS
2.0000 | ORAL_TABLET | ORAL | Status: DC
  Administered 2019-06-11 – 2019-06-13 (×2): 2 via ORAL
  Filled 2019-06-11 (×2): qty 2

## 2019-06-11 MED ORDER — SIMETHICONE 80 MG PO CHEW: 80.0000 mg | CHEWABLE_TABLET | ORAL | Status: DC | PRN

## 2019-06-11 MED ORDER — COCONUT OIL OIL
1.0000 "application " | TOPICAL_OIL | Status: DC | PRN
  Administered 2019-06-12: 1 via TOPICAL

## 2019-06-11 MED ORDER — MENTHOL 3 MG MT LOZG: 1.0000 | LOZENGE | OROMUCOSAL | Status: DC | PRN

## 2019-06-11 MED ORDER — PRENATAL MULTIVITAMIN CH
1.0000 | ORAL_TABLET | Freq: Every day | ORAL | Status: DC
  Administered 2019-06-11 – 2019-06-13 (×3): 1 via ORAL
  Filled 2019-06-11 (×3): qty 1

## 2019-06-11 MED ORDER — SIMETHICONE 80 MG PO CHEW
80.0000 mg | CHEWABLE_TABLET | ORAL | Status: DC
  Administered 2019-06-11 – 2019-06-13 (×2): 80 mg via ORAL
  Filled 2019-06-11 (×2): qty 1

## 2019-06-11 MED ORDER — TETANUS-DIPHTH-ACELL PERTUSSIS 5-2.5-18.5 LF-MCG/0.5 IM SUSP: 0.5000 mL | Freq: Once | INTRAMUSCULAR | Status: DC

## 2019-06-11 MED ORDER — ENOXAPARIN SODIUM 40 MG/0.4ML ~~LOC~~ SOLN
40.0000 mg | SUBCUTANEOUS | Status: DC
  Administered 2019-06-11 – 2019-06-12 (×2): 40 mg via SUBCUTANEOUS
  Filled 2019-06-11 (×2): qty 0.4

## 2019-06-11 MED ORDER — OXYCODONE-ACETAMINOPHEN 5-325 MG PO TABS
1.0000 | ORAL_TABLET | ORAL | Status: DC | PRN
  Administered 2019-06-11: 1 via ORAL
  Filled 2019-06-11: qty 1

## 2019-06-11 MED ORDER — WITCH HAZEL-GLYCERIN EX PADS: 1.0000 "application " | MEDICATED_PAD | CUTANEOUS | Status: DC | PRN

## 2019-06-11 MED ORDER — SIMETHICONE 80 MG PO CHEW
80.0000 mg | CHEWABLE_TABLET | Freq: Three times a day (TID) | ORAL | Status: DC
  Administered 2019-06-11 – 2019-06-13 (×8): 80 mg via ORAL
  Filled 2019-06-11 (×8): qty 1

## 2019-06-11 MED ORDER — OXYCODONE HCL 5 MG PO TABS
5.0000 mg | ORAL_TABLET | Freq: Four times a day (QID) | ORAL | Status: DC | PRN
  Administered 2019-06-11 – 2019-06-13 (×6): 5 mg via ORAL
  Filled 2019-06-11 (×6): qty 1

## 2019-06-11 MED ORDER — DIBUCAINE (PERIANAL) 1 % EX OINT: 1.0000 "application " | TOPICAL_OINTMENT | CUTANEOUS | Status: DC | PRN

## 2019-06-11 MED ORDER — IBUPROFEN 800 MG PO TABS
800.0000 mg | ORAL_TABLET | Freq: Three times a day (TID) | ORAL | Status: DC
  Administered 2019-06-11 – 2019-06-13 (×8): 800 mg via ORAL
  Filled 2019-06-11 (×8): qty 1

## 2019-06-11 MED ORDER — GABAPENTIN 100 MG PO CAPS
100.0000 mg | ORAL_CAPSULE | Freq: Three times a day (TID) | ORAL | Status: DC
  Administered 2019-06-11 – 2019-06-13 (×7): 100 mg via ORAL
  Filled 2019-06-11 (×7): qty 1

## 2019-06-11 MED ORDER — LACTATED RINGERS IV SOLN: INTRAVENOUS | Status: DC

## 2019-06-11 NOTE — Progress Notes (Signed)
Subjective: Postpartum Day 1: Cesarean Delivery for Premature Labor, Breech Baby A,  Patient reports tolerating PO.  Rates pain a zero at present. Notes itching, on Benadryl  Objective: Vital signs in last 24 hours: Temp:  [96.1 F (35.6 C)-98.3 F (36.8 C)] 98.3 F (36.8 C) (12/24 0603) Pulse Rate:  [56-95] 71 (12/24 0626) Resp:  [13-19] 18 (12/24 0603) BP: (90-123)/(37-81) 123/54 (12/24 0626) SpO2:  [94 %-100 %] 100 % (12/24 0603) Weight:  [69.4 kg] 69.4 kg (12/24 0626)  Physical Exam:  General: alert, cooperative and no distress Lochia: appropriate Uterine Fundus: firm Incision: not able to see , a pressure dressing in place, no blood seen, nontender lower abdomen DVT Evaluation: No evidence of DVT seen on physical exam. No cords or calf tenderness.  Recent Labs    06/10/19 2007 06/11/19 0516  HGB 11.2* 9.7*  HCT 33.4* 28.4*    Assessment/Plan: Status post Cesarean section. Doing well postoperatively.  D/c foley and Saline lock IV.Jonnie Kind 06/11/2019, 7:36 AM

## 2019-06-11 NOTE — Lactation Note (Addendum)
This note was copied from a baby's chart. Lactation Consultation Note  Patient Name: Kelli Soto Kelli Soto Date: 06/11/2019   Twins 20 hours old in NICU.  [redacted]w[redacted]d. Mother states she bf her first child for 2 mos. Mother states she unable to visit infants in [place] 14 days due to FOB being COVID +. Mother has been set up w/ DEBP.  Observed pumping, provided labels, colostrum containers and NICU booklet. Reviewed hand expressed drops bilaterally. #24 flange size appropriate. Reviewed cleaning and milk storage. Mom made aware of O/P services, breastfeeding support groups, community resources, and our phone # for post-discharge questions.  Mother has personal DEBP but unsure if it will be strong enough for twins. Byron faxed [place] referral.  Encouraged mother to pump at least 8 times per day. Recommend 2-2.5 hours during the day and q 4 hours at night.        Maternal Data    Feeding    LATCH Score                   Interventions    Lactation Tools Discussed/Used     Consult Status      Carlye Grippe 06/11/2019, 11:37 AM

## 2019-06-11 NOTE — Anesthesia Postprocedure Evaluation (Signed)
Anesthesia Post Note  Patient: Kelli Soto  Procedure(s) Performed: CESAREAN SECTION (N/A )     Patient location during evaluation: PACU Anesthesia Type: Spinal Level of consciousness: awake Pain management: pain level controlled Vital Signs Assessment: post-procedure vital signs reviewed and stable Respiratory status: spontaneous breathing Cardiovascular status: stable Postop Assessment: no headache, no backache, spinal receding, patient able to bend at knees and no apparent nausea or vomiting Anesthetic complications: no    Last Vitals:  Vitals:   06/11/19 0831 06/11/19 1546  BP: 127/64 (!) 111/59  Pulse: 83 61  Resp: 18 18  Temp: 36.8 C 36.7 C  SpO2: 96% 97%    Last Pain:  Vitals:   06/11/19 1641  TempSrc:   PainSc: 7    Pain Goal: Patients Stated Pain Goal: 3 (06/11/19 1641)                 Huston Foley

## 2019-06-11 NOTE — Progress Notes (Signed)
Pt. Set up with DEBP. She has prior history of breast feeding and was secure in usage. Reminded her to pump at least every 3 hrs. Discussed pump usage, breast milk storage.

## 2019-06-12 MED ORDER — ACETAMINOPHEN 500 MG PO TABS
1000.0000 mg | ORAL_TABLET | Freq: Four times a day (QID) | ORAL | Status: DC | PRN
  Administered 2019-06-12 – 2019-06-13 (×3): 1000 mg via ORAL
  Filled 2019-06-12 (×3): qty 2

## 2019-06-12 NOTE — Progress Notes (Signed)
I introduced spiritual care services yesterday to pt when she found that she was not able to visit her babies due to FOB being covid positive.  She is having a hard time with that, but is coping as best she can.  She does have good support from her father and from friends and family by phone.  I offered follow up support today.    [place], [place] Pager, [phone] 3:48 PM

## 2019-06-12 NOTE — Lactation Note (Signed)
This note was copied from a baby's chart. Lactation Consultation Note  Patient Name: SHANETRA BLUMENSTOCK Bieser [id number] Date: 06/12/2019 Reason for consult: Follow-up assessment;NICU baby;Infant < 6lbs;Preterm <34wks;Other (Comment)(multiple gestation)  F/U visit with P2 mom who delivered twins @ 27wks; babies are currently NPO in NICU.  LC entered room and mom immediately states she just pumped and nothing came out. Mom states she is very tired and wants to take a nap. Mom states she is pumping about every hour as directed but nothing comes out.  Praised mom for pumping efforts. Pump pieces remain intact at bedside with drops of moisture noted in collection bottle. Reviewed pumping q 2-3 hours for 15-20 mins on initiation setting instead of every hour, as that could become exhausting for mom. Reviewed proper flange fit and mom states the last pumping session was a bit uncomfortable but stated there was space between her nipple and the flange. Mom states she becomes emotional at times due to the holiday and not being able to see her babies; also states she is exhausted. Mom unable to remain awake during interaction but mom denies any questions or concerns; encouraged to call for any assistance.  Interventions Interventions: DEBP;Expressed milk  Lactation Tools Discussed/Used Pump Review: Setup, frequency, and cleaning   Consult Status Consult Status: Follow-up Date: 06/13/19 Follow-up type: In-patient    Cranston Neighbor 06/12/2019, 11:30 AM

## 2019-06-12 NOTE — Progress Notes (Signed)
Subjective: Postpartum Day 2: Cesarean Delivery Patient reports incisional pain, tolerating PO, + flatus and no problems voiding.    Objective: Vital signs in last 24 hours: Temp:  [98.1 F (36.7 C)-98.4 F (36.9 C)] 98.1 F (36.7 C) (12/25 0622) Pulse Rate:  [56-83] 56 (12/25 0622) Resp:  [18] 18 (12/25 0622) BP: (106-127)/(52-67) 114/52 (12/25 0622) SpO2:  [96 %-100 %] 100 % (12/25 0622)  Physical Exam:  General: alert Lochia: appropriate Uterine Fundus: firm Honeycomb dressing: clean, dry, and intact DVT Evaluation: No evidence of DVT seen on physical exam.  Recent Labs    06/10/19 2007 06/11/19 0516  HGB 11.2* 9.7*  HCT 33.4* 28.4*    Assessment/Plan: Status post Cesarean section. Doing well postoperatively.  Continue current care I offered her discharge home today, but she prefers to go home tomorrow.  Kelli Soto C Kelli Soto 06/12/2019, 7:00 AM

## 2019-06-13 MED ORDER — OXYCODONE HCL 5 MG PO TABS: 5.0000 mg | ORAL_TABLET | Freq: Four times a day (QID) | ORAL | 0 refills | Status: DC | PRN

## 2019-06-13 MED ORDER — MEASLES, MUMPS & RUBELLA VAC IJ SOLR
0.5000 mL | Freq: Once | INTRAMUSCULAR | Status: AC
  Administered 2019-06-13: 0.5 mL via SUBCUTANEOUS
  Filled 2019-06-13: qty 0.5

## 2019-06-13 MED ORDER — IBUPROFEN 600 MG PO TABS: 600.0000 mg | ORAL_TABLET | Freq: Four times a day (QID) | ORAL | 2 refills | Status: AC | PRN

## 2019-06-13 MED ORDER — BISACODYL 10 MG RE SUPP
10.0000 mg | Freq: Once | RECTAL | Status: AC
  Administered 2019-06-13: 09:00:00 10 mg via RECTAL
  Filled 2019-06-13: qty 1

## 2019-06-13 NOTE — Discharge Instructions (Signed)

## 2019-06-13 NOTE — Discharge Summary (Signed)
Postpartum Discharge Summary     Patient Name: Kelli Soto DOB: 04/14/1996 MRN: [id number]  Date of admission: 06/10/2019 Delivering Provider:    Danyele, Smejkal [[id number]]  CONSTANT, [place]    T[place]ha, Free [[id number]]  CONSTANT, [place]    Date of discharge: 06/13/2019  Admitting diagnosis: S/P cesarean section [Z98.891] Intrauterine pregnancy: [redacted]w[redacted]d    Secondary diagnosis:  Principal Problem:   Twin gestation, monochorionic diamniotic Active Problems:   Rubella non-immune status, antepartum   Group B streptococcus bacteria in urine complicating pregnancy   S/P cesarean section  Additional problems: preterm labor     Discharge diagnosis: Preterm Pregnancy Delivered and Anemia                                                                                                Post partum procedures:none  Complications: None  Hospital course:  Onset of Labor With Unplanned C/S  23y.o. yo G2P1103 at 23w5das [facility]. Patient was taken to the OR for malpresentation of both babies being breech.    The patient [facility],   Kelli Soto, Kelli Soto[[id number]]06/10/2019    Kelli Soto, Kelli Soto[[id number]]06/10/2019   Details of operation can be found in separate operative note. Patient had an uncomplicated postpartum course.  She is ambulating,tolerating a regular diet, passing flatus, and urinating well.  Patient is discharged home in stable condition 06/13/19. Delivery time:    Kelli Soto, Kelli Soto[[id number]]8:56 PM    Kelli Soto, Kelli Soto[[id number]]8:57 PM    Magnesium Sulfate received: No BMZ received: Yes Rhophylac:N/A MMR:Yes Transfusion:No  Physical exam  Vitals:   06/12/19 1500 06/12/19 1920 06/12/19 2330 06/13/19 0435  BP: (!) 117/55 (!) 109/49 (!) 105/54 112/61  Pulse: 77 78 84 63  Resp: '18 18  19  ' Temp: 98.1 F (36.7 C) 98.2  F (36.8 C) 98.3 F (36.8 C) 98 F (36.7 C)  TempSrc: Oral Oral Oral Oral  SpO2: 100% 100% 100% 100%  Weight:      Height:       General: alert, cooperative and no distress Lochia: appropriate Uterine Fundus: firm Incision: Healing well with no significant drainage, No significant erythema, Dressing is clean, dry, and intact DVT Evaluation: No evidence of DVT seen on physical exam. Negative Homan's sign. No cords or calf tenderness. Labs: Lab Results  Component Value Date   WBC 16.1 (H) 06/11/2019   HGB 9.7 (L) 06/11/2019   HCT 28.4 (L) 06/11/2019   MCV 89.3 06/11/2019   PLT 158 06/11/2019   CMP Latest Ref Rng & Units 06/11/2019  Glucose 70 - 99 mg/dL -  BUN 6 - 20 mg/dL -  Creatinine 0.44 - 1.00 mg/dL 0.55  Sodium 135 - 145 mmol/L -  Potassium 3.5 - 5.1 mmol/L -  Chloride 98 - 111 mmol/L -  CO2 22 - 32 mmol/L -  Calcium 8.9 - 10.3 mg/dL -  Total Protein 6.5 - 8.1 g/dL -  Total Bilirubin 0.3 -  1.2 mg/dL -  Alkaline Phos 38 - 126 U/L -  AST 15 - 41 U/L -  ALT 0 - 44 U/L -    Discharge instruction: per After Visit Summary and "Baby and Me Booklet".  After visit meds:  Allergies as of 06/13/2019   No Known Allergies     Medication List    STOP taking these medications   Blood Pressure Monitor Kit   folic acid 1 MG tablet Commonly known as: FOLVITE   metoCLOPramide 10 MG tablet Commonly known as: REGLAN   terconazole 0.4 % vaginal cream Commonly known as: TERAZOL 7     TAKE these medications   aspirin EC 81 MG tablet Take 1 tablet (81 mg total) by mouth daily.   butalbital-acetaminophen-caffeine 50-325-40 MG tablet Commonly known as: FIORICET Take 1-2 tablets by mouth every 6 (six) hours as needed for headache.   docusate sodium 100 MG capsule Commonly known as: COLACE Take 1 capsule (100 mg total) by mouth 2 (two) times daily as needed for mild constipation.   ferrous fumarate 325 (106 Fe) MG Tabs tablet Commonly known as: HEMOCYTE - 106 mg  FE Take 1 tablet (106 mg of iron total) by mouth 2 (two) times daily.   ibuprofen 600 MG tablet Commonly known as: ADVIL Take 1 tablet (600 mg total) by mouth every 6 (six) hours as needed for moderate pain.   oxyCODONE 5 MG immediate release tablet Commonly known as: Oxy IR/ROXICODONE Take 1 tablet (5 mg total) by mouth every 6 (six) hours as needed for moderate pain or severe pain.   polyethylene glycol 17 g packet Commonly known as: MIRALAX / GLYCOLAX Take 17 g by mouth 2 (two) times daily as needed (constipation).   PRENATAL VITAMIN PO Take by mouth.   Vitamin D 50 MCG (2000 UT) tablet Take 1 tablet (2,000 Units total) by mouth daily.       Diet: routine diet  Activity: Advance as tolerated. Pelvic rest for 6 weeks.   Outpatient follow up:2 weeks Follow up Appt: Future Appointments  Date Time Provider [place]  06/17/2019  9:45 AM Caren Macadam, MD [facility]  06/24/2019 12:45 PM [place] 2 WH-MFCUS MFC-US  06/24/2019 12:50 PM [place] NURSE [place] MFC-US  07/07/2019 11:00 AM [place] 3 WH-MFCUS MFC-US  07/07/2019 11:10 AM WH-MFC NURSE WH-MFC MFC-US  07/14/2019 11:00 AM WH-MFC [place] 3 WH-MFCUS MFC-US  07/14/2019 11:10 AM WH-MFC NURSE [place] MFC-US   Follow up Visit: [place] for [employer] at [facility]. Schedule an appointment as soon as possible for a visit in 1 week(s).   Specialty: Obstetrics and Gynecology Why: for incision check Contact information: [place] [phone]            Newborn Data:   Hannie, Shoe [[id number]]  Live born female  Birth Weight: 2 lb 2.2 oz (970 g) APGAR: 4, 8  Newborn Delivery   Birth date/time: 06/10/2019 20:56:00 Delivery type: C-Section, Low Transverse Trial of labor: No C-section categorization: Primary       Taylynn, [place] [[id number]]  Live born female  Birth Weight: 2 lb 0.8 oz (930  g) APGAR: 4, 8  Newborn Delivery   Birth date/time: 06/10/2019 20:57:00 Delivery type: C-Section, Low Transverse Trial of labor: No C-section categorization: Primary      Baby Feeding: Bottle and Breast Disposition:NICU   06/13/2019 Truett Mainland, DO

## 2019-06-13 NOTE — Clinical Social Work Maternal (Signed)
CLINICAL SOCIAL WORK MATERNAL/CHILD NOTE  Patient Details  Name: Kelli Soto MRN: [id number] Date of Birth: September 12, 1995  Date:  06/13/2019  Clinical Social Worker Initiating Note:  Madilyn Fireman, MSW, LCSW-A Date/Time: Initiated:  06/13/19/1004     Child's Name:  Kelli Soto and Kelli Soto Parents:  Mother, Father   Need for Interpreter:  None   Reason for Referral:  (EDPS score of 12)   Address:  [address] [place] [address]    Phone number:  [phone] (home)     Additional phone number:   Household Members/Support Persons (HM/SP):   Household Member/Support Person 1   HM/SP Name Relationship DOB or Age  HM/SP -1 Kelli Soto FOB 21  HM/SP -2        HM/SP -3        HM/SP -4        HM/SP -5        HM/SP -6        HM/SP -7        HM/SP -8          Natural Supports (not living in the home):  Friends, Immediate Family, Extended Family   Professional Supports: None   Employment: Unemployed   Type of Work:     Education:  Programmer, systems   Homebound arranged:    Museum/gallery curator Resources:  Medicaid   Other Resources:  Physicist, medical , [place] Considerations Which May Impact Care:  Non-demoninational  Strengths:  Ability to meet basic needs , Compliance with medical plan , Home prepared for child    Psychotropic Medications:         Pediatrician:       Pediatrician List:   [place]      Pediatrician Fax Number:    Risk Factors/Current Problems:      Cognitive State:  Alert    Mood/Affect:  Calm , Happy , Interested    CSW Assessment: CSW received consult for [name] due to her EPDS score of 12. CSW spoke with [name] to complete an assessment. [name] stated that she was anxious after delivering twin girls at 29 weeks. [name] reports these are her second and third child, her oldest is 23 years old. [name] reports she has been  in a good mood since delivery. [name] reports feeling anxious now about not being able to see the infants in person due to FOB's positive COVID result. [name] reports she is able to see the twins via camera but that's it. [name] denies any thoughts or feelings of hurting herself or anyone else. [name] states she does not feel the need for counseling or medication at this time.  [name] states the newborn's names are Kelli Soto and Kelli Soto. [name] reports that FOB is Kelli Soto. [name] reports receiving WIC, Food Stamps, and Medicaid. [name] reports she is pumping breast milk and sending it to the NICU. [name] reports her father has been with her at [facility] since Christmas Eve. [name] reports she lives in a safe and stable home with her father, FOB, and child. [name] reports she is unemployed but a Programmer, systems. [name] reports that her mother is obtaining car seats for the newborns. [name] reports she has a baby shower planned for January to obtain all items needed for newborn care. [name] reports that FOB is quarantining at  his mother's house, due to her positive result as well. [name] reports her oldest child receives pediatric care at [facility], but is interested in changing practices to [facility] for the twins and older child due to location.  CSW explained to [name] that ongoing support from [facility] staff would occur while newborns are in NICU, [name] expressed gratitude for support.   CSW Plan/Description:  Psychosocial Support and Ongoing Assessment of Needs    Inis Sizer, LCSW 06/13/2019, 10:10 AM

## 2019-06-13 NOTE — Lactation Note (Signed)
This note was copied from a baby's chart. Lactation Consultation Note  Patient Name: Kelli Soto Kazee OJJKK'X Date: 06/13/2019 Reason for consult: Follow-up assessment;NICU baby   Mom states she hasn't pumped since 430 am.  She feels her breasts are filling up.  Navarino asked permission to assess breasts.  Full areas in breasts felt; nothing hard.    LC encouraged mom to hand express prior to pumping.  Mom says she is pumping an hour because it takes so long for the milk to start coming.    LC reviewed using initiate setting and when to switch to maintenance setting.  LC encouraged pumping 8x in 24 hours for 15-20 minutes.   LC explained hand expression prior to pumping would help milk flow sooner with pump use.    Mom was told the previous night not to use the coconut oil when pumping.  LC encouraged mom to use oil inside flange to reduce friction/discomfort when pumping if she felt it helped.  LC observed pumping; 24 flange fitting snuggly and mom c/o rubbing feeling.  LC replaced with 27 size flange and mom felt better and had collected approx. 20 ml when [place] left room; mom still pumping.  LC discussed [place] loaner.  Mom would like to try her [place] she has at home before getting a loaner.  LC reminded her to carry her pump parts after being DC and to utilize the DEBP in the NICU room when visiting her infants.  Mom understands and was encouraged to ask for assistance if further questions regarding pump/pump parts arise during infants NICU stay.  Maternal Data    Feeding Feeding Type: Breast Milk  LATCH Score                   Interventions Interventions: DEBP;Coconut oil  Lactation Tools Discussed/Used     Consult Status Consult Status: Follow-up Date: 06/14/19 Follow-up type: In-patient    Ferne Coe [facility] 06/13/2019, 10:59 AM

## 2019-06-14 ENCOUNTER — Ambulatory Visit: Payer: Self-pay

## 2019-06-14 NOTE — Lactation Note (Signed)
This note was copied from a baby's chart. Lactation Consultation Note  Patient Name: LADAWNA WALGREN Benecke [id number] Date: 06/14/2019   Twins in NICU 52 hours old.  Spoke to mother over the phone. She is unable to visit with her twins for 14 days since FOB is COVID+. Mother called complaining that her Avent pump is hurting her.  The suction is too strong and requested a [facility] loaner.  She has tried to adjust suction and flange size. Mother has not pumped since she pumped w/ Claiborne Billings LC last night. She described that her breasts are full and becoming hard. Mother states she does have a Medela hand pump. Suggest she pump with the Medela hand pump until she comes to hospital to pick up [place]. LC will meet mother in lobby when she arrives and calls to complete [facility] loaner paperwork/deposit.       Maternal Data    Feeding    LATCH Score                   Interventions    Lactation Tools Discussed/Used     Consult Status      Vivianne Master [facility] 06/14/2019, 8:26 AM

## 2019-06-14 NOTE — Lactation Note (Signed)
This note was copied from a baby's chart. Lactation Consultation Note  Patient Name: AMAIA LAVALLIE Nath POEUM'P Date: 06/14/2019 Reason for consult: Follow-up assessment;NICU baby;Pump rental;Multiple gestation  Mom came to pick up her [place], Symphony pump # [id number] was issued. Mom aware of agreement and that she needs to return the pump on or before 06/26/2019. Mom told LC that her Avent pump was hurting even though it had the "softer plastic parts". GOB came to help mom carry the pump, they reported all questions and concerns were answered, they're both aware of [place] OP services and will call PRN.  Maternal Data    Feeding Feeding Type: Breast Milk  LATCH Score                   Interventions Interventions: Breast feeding basics reviewed;DEBP  Lactation Tools Discussed/Used Date initiated:: 06/14/19   Consult Status Consult Status: Follow-up Date: 06/26/19 Follow-up type: In-patient    Tasean Mancha Francene Boyers 06/14/2019, 6:36 PM

## 2019-06-15 ENCOUNTER — Encounter (HOSPITAL_COMMUNITY): Payer: Self-pay | Admitting: Obstetrics and Gynecology

## 2019-06-15 LAB — SURGICAL PATHOLOGY

## 2019-06-16 ENCOUNTER — Ambulatory Visit: Payer: Medicaid Other | Attending: Internal Medicine

## 2019-06-16 DIAGNOSIS — Z20822 Contact with and (suspected) exposure to covid-19: Secondary | ICD-10-CM

## 2019-06-17 ENCOUNTER — Telehealth: Payer: Medicaid Other | Admitting: Family Medicine

## 2019-06-17 ENCOUNTER — Encounter: Payer: Medicaid Other | Admitting: Family Medicine

## 2019-06-17 LAB — NOVEL CORONAVIRUS, NAA: SARS-CoV-2, NAA: NOT DETECTED

## 2019-06-18 ENCOUNTER — Ambulatory Visit: Payer: Self-pay

## 2019-06-18 NOTE — Lactation Note (Signed)
This note was copied from a baby's chart. Lactation Consultation Note  Patient Name: Kelli Soto Date: 06/18/2019  the NICU secretary called this [facility] and informed her this mom was requesting to talk to [facility] . LC was busy with another patient and asked her to obtain her phone  number and LC would call her back.  LC called mom back and she requested to know where to return her [facility] loaner.  LC gave mom directions to Maternity admission and she would receive her $30.oo.     Maternal Data    Feeding Feeding Type: Breast Milk  LATCH Score                   Interventions    Lactation Tools Discussed/Used     Consult Status      Kelli Soto 06/18/2019, 12:13 PM

## 2019-06-22 ENCOUNTER — Other Ambulatory Visit: Payer: Self-pay

## 2019-06-22 ENCOUNTER — Ambulatory Visit (INDEPENDENT_AMBULATORY_CARE_PROVIDER_SITE_OTHER): Payer: Medicaid Other | Admitting: *Deleted

## 2019-06-22 VITALS — BP 121/78 | HR 60 | Wt 153.0 lb

## 2019-06-22 DIAGNOSIS — Z98891 History of uterine scar from previous surgery: Secondary | ICD-10-CM

## 2019-06-22 NOTE — Progress Notes (Signed)
Subjective:     Kelli Soto is a 24 y.o. female who presents to the clinic two  weeks status post c-section delivery for an incision check.      Objective:    LMP 11/28/2018 (Exact Date)  Incision:   healing well, no hernia, no seroma, no swelling, well approximated, no dehiscence, incision well approximated     Assessment:    Doing well postoperatively.   Plan:   1. Wound care discussed. 2.Follow up: at her postpartum visit or as needed.    Scheryl Marten, RN

## 2019-06-24 ENCOUNTER — Ambulatory Visit (HOSPITAL_COMMUNITY): Payer: Medicaid Other

## 2019-06-26 NOTE — Progress Notes (Signed)
I have reviewed this chart and agree with the RN/CMA assessment and management.    Carrolyn Hilmes C Quince Santana, MD, FACOG Attending Physician, Faculty Practice [facility]  

## 2019-07-01 ENCOUNTER — Ambulatory Visit: Payer: Self-pay

## 2019-07-01 NOTE — Lactation Note (Signed)
This note was copied from a baby's chart. Lactation Consultation Note  Patient Name: Kelli Soto Milosevic JRPZP'S Date: 07/01/2019   RN called requesting LC to come talk with Mom.  Mom states her milk supply has decreased in last 2 days.  She had been pumping over 4oz per pumping, and now she is getting 1-2 oz.  Mom admitted she had fallen off her schedule, but though that pumping less frequently would provide her more volume per pumping.  Mom encouraged to pump more often and also try "power pumping" on ce a day.  Reviewed how to do this.  Encouraged breast massage, and warm compresses and hand expression also.  Mom in recliner with baby STS on her chest.  Encouraged Mom to pump when baby goes back to isolette.  Reassured Mom that her milk supply can increase with increase in pumping.    Judee Clara 07/01/2019, 6:00 PM

## 2019-07-07 ENCOUNTER — Ambulatory Visit (HOSPITAL_COMMUNITY): Payer: Medicaid Other

## 2019-07-14 ENCOUNTER — Ambulatory Visit (HOSPITAL_COMMUNITY): Payer: Medicaid Other

## 2019-08-06 ENCOUNTER — Other Ambulatory Visit: Payer: Self-pay | Admitting: Obstetrics and Gynecology

## 2019-09-03 ENCOUNTER — Other Ambulatory Visit: Payer: Self-pay | Admitting: Obstetrics and Gynecology

## 2019-09-25 ENCOUNTER — Other Ambulatory Visit: Payer: Self-pay | Admitting: Obstetrics & Gynecology

## 2019-12-02 ENCOUNTER — Other Ambulatory Visit (HOSPITAL_COMMUNITY)
Admission: RE | Admit: 2019-12-02 | Discharge: 2019-12-02 | Disposition: A | Discharge status: Home / Self Care | Payer: Medicaid Other | Source: Ambulatory Visit | Attending: Family Medicine | Admitting: Family Medicine

## 2019-12-02 ENCOUNTER — Other Ambulatory Visit: Payer: Self-pay

## 2019-12-02 ENCOUNTER — Ambulatory Visit: Payer: Medicaid Other | Admitting: *Deleted

## 2019-12-02 VITALS — BP 107/68 | HR 61

## 2019-12-02 DIAGNOSIS — N898 Other specified noninflammatory disorders of vagina: Secondary | ICD-10-CM | POA: Diagnosis present

## 2019-12-02 DIAGNOSIS — N760 Acute vaginitis: Secondary | ICD-10-CM

## 2019-12-02 NOTE — Progress Notes (Signed)
SUBJECTIVE:  24 y.o. female complains of vaginal itching and irritation for 5 day(s). Denies abnormal vaginal bleeding or significant pelvic pain or fever. No UTI symptoms. Denies history of known exposure to STD.  No LMP recorded.  OBJECTIVE:  She appears well, afebrile. Urine dipstick: not done.  ASSESSMENT:  Vaginal Irritation    PLAN:  GC, chlamydia, trichomonas, BVAG, CVAG probe sent to lab. Treatment: To be determined once lab results are received ROV prn if symptoms persist or worsen.

## 2019-12-03 LAB — CERVICOVAGINAL ANCILLARY ONLY
Bacterial Vaginitis (gardnerella): POSITIVE — AB
Candida Glabrata: NEGATIVE
Candida Vaginitis: NEGATIVE
Chlamydia: NEGATIVE
Comment: NEGATIVE
Comment: NEGATIVE
Comment: NEGATIVE
Comment: NEGATIVE
Comment: NEGATIVE
Comment: NORMAL
Neisseria Gonorrhea: NEGATIVE
Trichomonas: NEGATIVE

## 2019-12-04 MED ORDER — METRONIDAZOLE 500 MG PO TABS: 500.0000 mg | ORAL_TABLET | Freq: Two times a day (BID) | ORAL | 0 refills | Status: DC

## 2019-12-04 NOTE — Addendum Note (Signed)
Addended by: Geanie Berlin on: 12/04/2019 08:55 AM   Modules accepted: Orders

## 2019-12-24 ENCOUNTER — Ambulatory Visit (INDEPENDENT_AMBULATORY_CARE_PROVIDER_SITE_OTHER): Payer: Medicaid Other | Admitting: Obstetrics and Gynecology

## 2019-12-24 ENCOUNTER — Other Ambulatory Visit: Payer: Self-pay

## 2019-12-24 ENCOUNTER — Encounter: Payer: Self-pay | Admitting: Obstetrics and Gynecology

## 2019-12-24 VITALS — BP 107/67 | HR 62 | Wt 172.0 lb

## 2019-12-24 DIAGNOSIS — Z3009 Encounter for other general counseling and advice on contraception: Secondary | ICD-10-CM | POA: Diagnosis not present

## 2019-12-24 DIAGNOSIS — G43009 Migraine without aura, not intractable, without status migrainosus: Secondary | ICD-10-CM | POA: Diagnosis not present

## 2019-12-24 MED ORDER — XULANE 150-35 MCG/24HR TD PTWK: 1.0000 | MEDICATED_PATCH | TRANSDERMAL | 6 refills | Status: DC

## 2019-12-24 MED ORDER — METRONIDAZOLE 0.75 % VA GEL: 1.0000 | Freq: Every day | VAGINAL | 1 refills | Status: DC

## 2019-12-24 NOTE — Progress Notes (Signed)
  Obstetrics and Gynecology Visit Return Patient Evaluation  Appointment Date: 12/24/2019  Primary Care Provider: Patient, No Pcp Per  OBGYN Clinic: [facility]  Chief Complaint: contraception management  History of Present Illness:  Kelli Soto is a 24 y.o. P3 (LMP: 6/30) with above CC. S/p 06/10/19 c/s for twins. Pt said period started late January and is qmonth, regular, one week. She was on depo provera but it caused weight gain, she was on nexplanon but had AUB refractory to management with combined OCPs.   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 Pt states she gets them now when tired and sometimes with her periods.   Review of Systems: as noted in the History of Present Illness. Medications:  None  Allergies: has No Known Allergies.  Physical Exam:  BP 107/67   Pulse 62   Wt 172 lb (78 kg)   LMP 12/16/2019 (Exact Date)   BMI 29.52 kg/m  Body mass index is 29.52 kg/m. General appearance: Well nourished, well developed female in no acute distress.  Neuro/Psych:  Normal mood and affect.    Assessment: pt doing well  Plan:  1. Encounter for other general counseling or advice on contraception D/w her re: options and she'd like to try the patch and if that doesn't work then come back for the ring or an IUD. Pt is borderline for twirla so will do xulane. D/w her re: potential risk with migraine hx and to call us if she has recurrence of HAs on the patch   RTC: PRN  Cornelia Copa MD Attending Center for [facility])

## 2019-12-24 NOTE — Progress Notes (Signed)
Would like to discuss the [facility] patch and depo

## 2020-02-11 ENCOUNTER — Other Ambulatory Visit: Payer: Self-pay | Admitting: *Deleted

## 2020-02-11 NOTE — Telephone Encounter (Signed)
erroneous

## 2020-02-15 ENCOUNTER — Ambulatory Visit
Admission: EM | Admit: 2020-02-15 | Discharge: 2020-02-15 | Disposition: A | Discharge status: Home / Self Care | Payer: Medicaid Other | Attending: Internal Medicine | Admitting: Internal Medicine

## 2020-02-15 ENCOUNTER — Other Ambulatory Visit: Payer: Self-pay

## 2020-02-15 DIAGNOSIS — Z0189 Encounter for other specified special examinations: Secondary | ICD-10-CM

## 2020-02-15 DIAGNOSIS — G43009 Migraine without aura, not intractable, without status migrainosus: Secondary | ICD-10-CM

## 2020-02-15 NOTE — Discharge Instructions (Signed)

## 2020-02-17 LAB — NOVEL CORONAVIRUS, NAA: SARS-CoV-2, NAA: NOT DETECTED

## 2020-05-23 ENCOUNTER — Other Ambulatory Visit: Payer: Self-pay

## 2020-05-23 ENCOUNTER — Other Ambulatory Visit (HOSPITAL_COMMUNITY)
Admission: RE | Admit: 2020-05-23 | Discharge: 2020-05-23 | Disposition: A | Discharge status: Home / Self Care | Payer: Medicaid Other | Source: Ambulatory Visit | Attending: Obstetrics & Gynecology | Admitting: Obstetrics & Gynecology

## 2020-05-23 ENCOUNTER — Ambulatory Visit (INDEPENDENT_AMBULATORY_CARE_PROVIDER_SITE_OTHER): Payer: Medicaid Other

## 2020-05-23 VITALS — BP 128/65

## 2020-05-23 DIAGNOSIS — N760 Acute vaginitis: Secondary | ICD-10-CM

## 2020-05-23 DIAGNOSIS — N898 Other specified noninflammatory disorders of vagina: Secondary | ICD-10-CM

## 2020-05-23 NOTE — Progress Notes (Signed)
SUBJECTIVE:  24 y.o. female complains of vaginal discharge for x7 days. Denies abnormal vaginal bleeding or significant pelvic pain or fever. No UTI symptoms. Denies history of known exposure to STD.  No LMP recorded.  OBJECTIVE:  She appears well, afebrile. Urine dipstick: not done.  ASSESSMENT:  Vaginal Discharge  Vaginal Odor   PLAN:  GC, chlamydia, trichomonas, BVAG, CVAG probe sent to lab. Treatment: To be determined once lab results are received ROV prn if symptoms persist or worsen.

## 2020-05-24 NOTE — Progress Notes (Signed)
Patient was assessed and managed by nursing staff during this encounter. I have reviewed the chart and agree with the documentation and plan. I have also made any necessary editorial changes.  Jaynie Collins, MD 05/24/2020 9:04 AM

## 2020-05-25 LAB — CERVICOVAGINAL ANCILLARY ONLY
Bacterial Vaginitis (gardnerella): POSITIVE — AB
Candida Glabrata: NEGATIVE
Candida Vaginitis: NEGATIVE
Chlamydia: NEGATIVE
Comment: NEGATIVE
Comment: NEGATIVE
Comment: NEGATIVE
Comment: NEGATIVE
Comment: NEGATIVE
Comment: NORMAL
Neisseria Gonorrhea: NEGATIVE
Trichomonas: NEGATIVE

## 2020-05-26 MED ORDER — TINIDAZOLE 500 MG PO TABS: 2.0000 g | ORAL_TABLET | Freq: Every day | ORAL | 2 refills | Status: DC

## 2020-05-26 NOTE — Addendum Note (Signed)
Addended by: Jaynie Collins A on: 05/26/2020 03:00 PM   Modules accepted: Orders

## 2020-05-27 ENCOUNTER — Encounter: Payer: Self-pay | Admitting: *Deleted

## 2020-05-28 IMAGING — US US MFM OB COMPLETE +14 WKS
1 series · 13 of 28 positions shown · non-contrast
Comparison: none

[Series 1: us mfm ob complete +14 wks · 51 acquisitions, 13 frames shown]
[im 2/51]
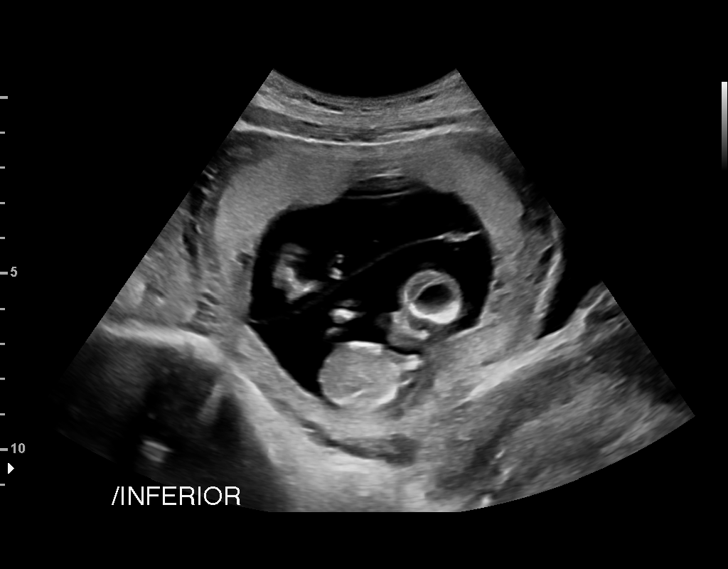
[im 6/51]
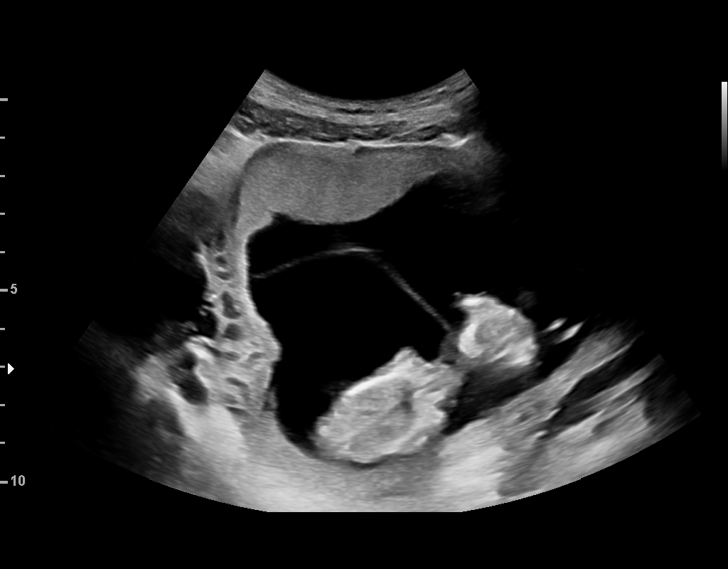
[im 10/51]
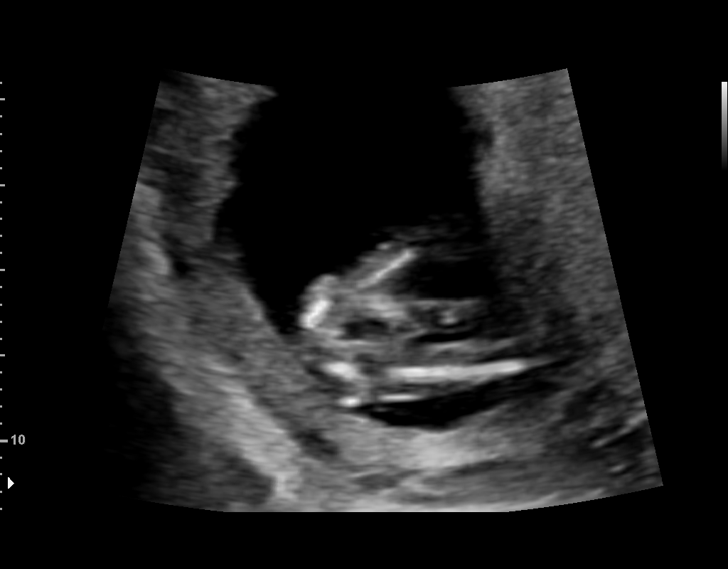
[im 13/51]
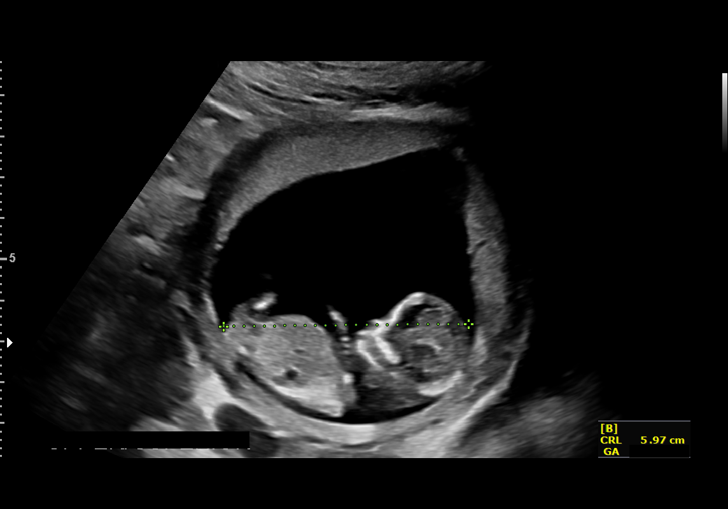
[im 17/51]
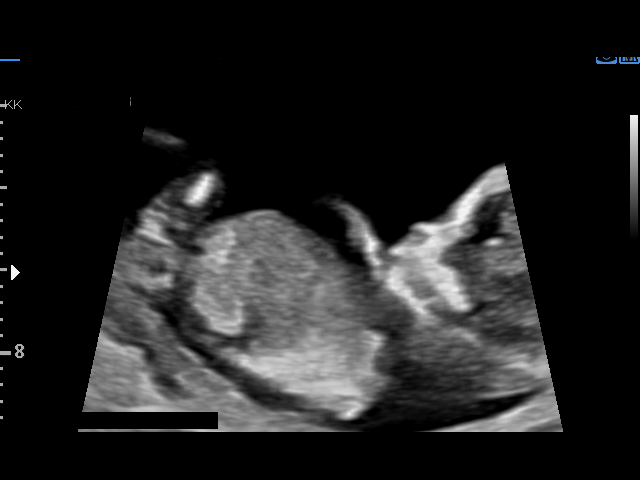
[im 21/51]
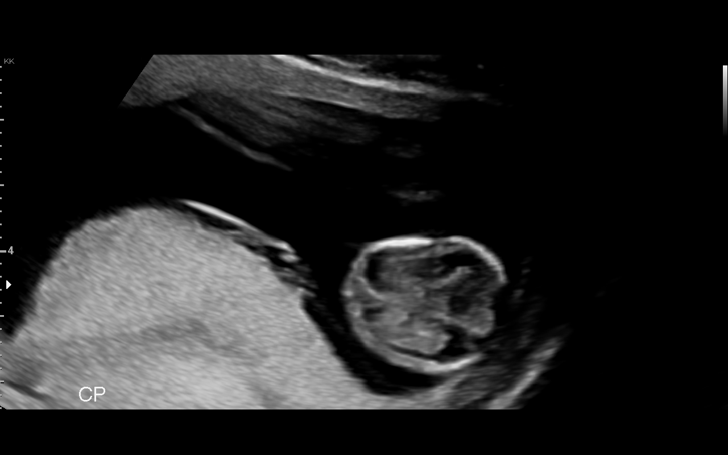
[im 26/51]
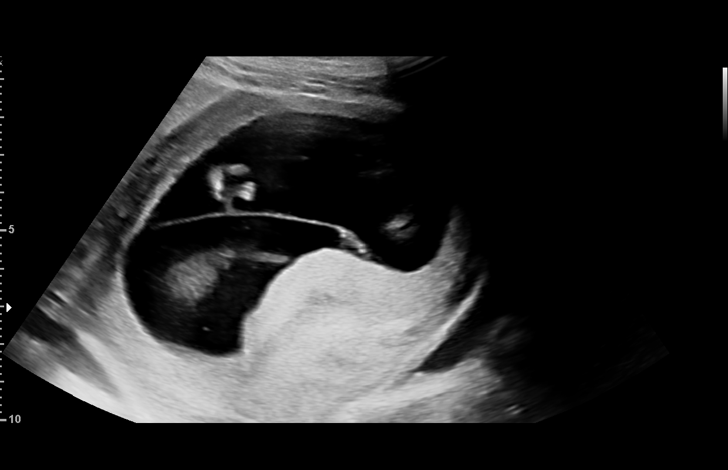
[im 30/51]
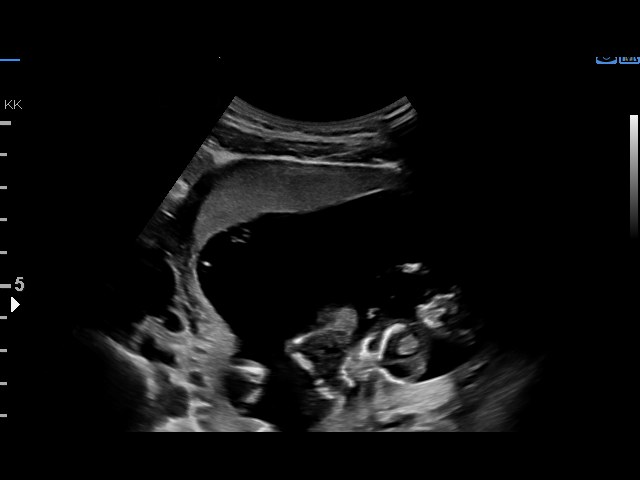
[im 34/51]
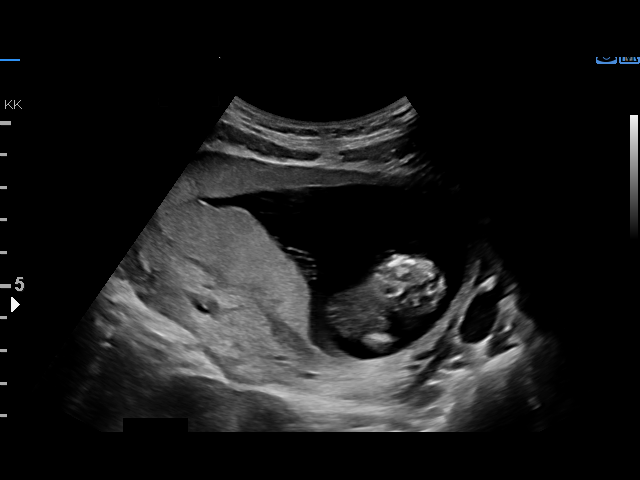
[im 38/51]
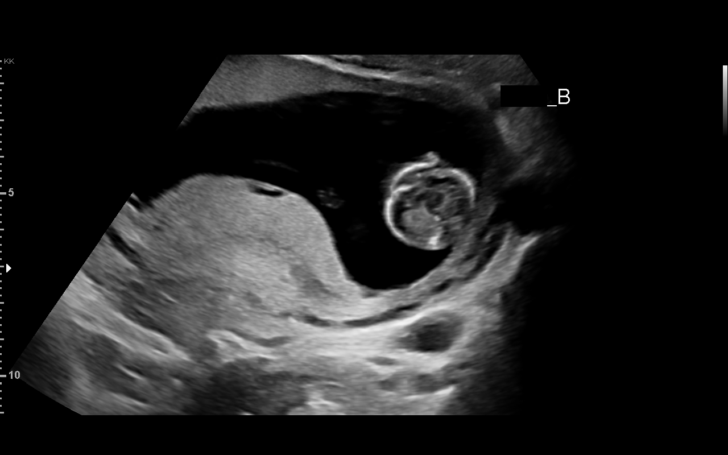
[im 41/51]
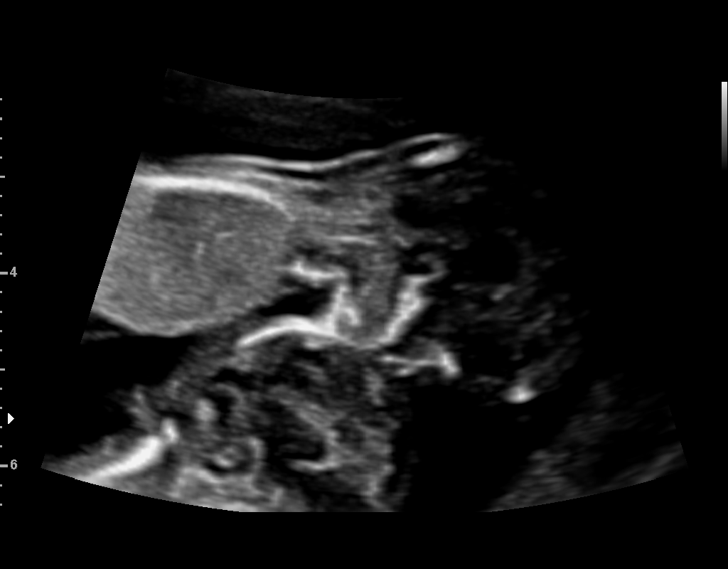
[im 45/51]
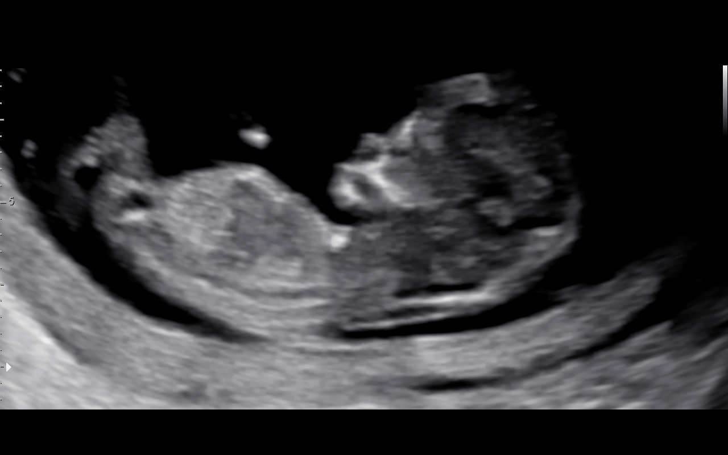
[im 49/51]
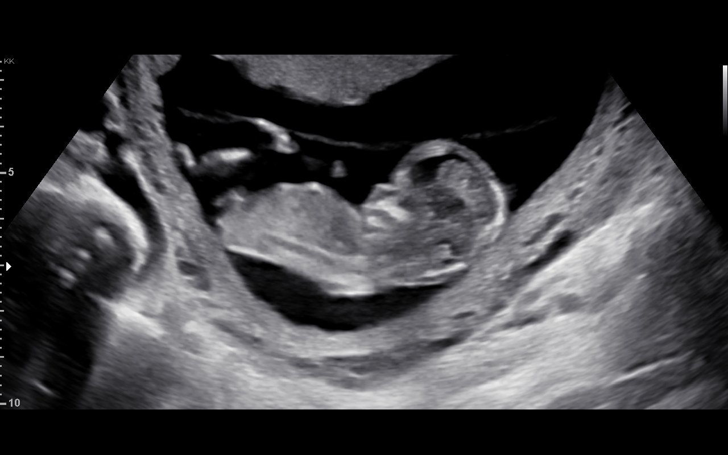

[13 of 28 positions shown; findings below may reference images not displayed]

Name:       KLEIN LOP                 Visit Date: 02/25/2019 [REDACTED]

  1  US MFM OB COMP LESS THAN             76801.4      MILTON ANIBAL LAMADRID
     14 WEEKS
  2  US MFM OB COMP EACH ADD              76802.1      MILTON ANIBAL LAMADRID
     LABELLE SALHA LESS 14 WEEKS
 ----------------------------------------------------------------------

 ----------------------------------------------------------------------
Indications

  Twin pregnancy, Albaro/Fish, first trimester
  Poor obstetric history: Previous fetal growth
  restriction (FGR) (57w5d 2450g)
  12 weeks gestation of pregnancy
  Low Risk NIPS
 ----------------------------------------------------------------------
Fetal Evaluation (Fetus A)

 Num Of Fetuses:         2
 Fetal Heart Rate(bpm):  153
 Cardiac Activity:       Observed
 Fetal Lie:              Lower Fetus
 Presentation:           Variable
 Placenta:               Anterior Fundal
 P. Cord Insertion:      Visualized
 Membrane Desc:      Dividing Membrane seen

 Amniotic Fluid
 AFI FV:      Within normal limits
Biometry (Fetus A)

 CRL:      62.2  mm     G. Age:  12w 3d                  EDD:   [DATE]
OB History
 Gravidity:    2         Term:   1        Prem:   0        SAB:   0
 TOP:          0       Ectopic:  0        Living: 1
Gestational Age (Fetus A)

 LMP:           12w 5d        Date:  09/06/19                 EDD:   11/28/18
 Best:          12w 5d     Det. By:  LMP  (09/04/19)          EDD:   11/28/18

Fetal Evaluation (Fetus B)

 Num Of Fetuses:         2
 Fetal Heart Rate(bpm):  149
 Cardiac Activity:       Observed
 Fetal Lie:              Upper Fetus
 Presentation:           Variable
 Placenta:               Anterior Fundal
 P. Cord Insertion:      Visualized
 Membrane Desc:      Dividing Membrane seen

 Amniotic Fluid
 AFI FV:      Within normal limits
Biometry (Fetus B)

 CRL:      60.1  mm     G. Age:  12w 2d                  EDD:   09/04/19
Gestational Age (Fetus B)

 LMP:           12w 5d        Date:  09/07/19                 EDD:   11/28/18
 Best:          12w 5d     Det. By:  LMP  (09/04/19)          EDD:   11/28/18
Cervix Uterus Adnexa

 Cervix
 Normal appearance by transabdominal scan.
Comments

 This patient was seen for a first trimester ultrasound due to a
 spontaneously conceived twin pregnancy. She denies any
 significant past medical history and denies any problems in
 her current pregnancy.

 The patient already had a cell free DNA test earlier in her
 pregnancy which indicated a low risk for trisomy 21, 18, and
 13.  A monozygotic or identical twin pregnancy was reported
 on the cell free DNA test. The patient did not want the fetal
 genders revealed.

 On today's exam, a single placenta along with a thin dividing
 membrane was noted separating the two fetuses indicating
 that these are monochorionic, diamniotic twins.
 The crown-rump lengths measured for both twin A and twin B
 are consistent with her gestational age.  There was normal
 amniotic fluid noted around both fetuses.  The nuchal
 translucencies appeared normal for both fetuses.
 The implications and management of monochorionic twins
 was discussed. The 10% to 15% risk of twin to twin
 transfusion syndrome seen in monochorionic, diamniotic
 twins was discussed today.  The implications and
 management of twin to twin transfusion syndrome (TTTS)
 should she develop this complication was also discussed.
 She was advised that we will continue to follow her closely
 with serial ultrasounds to assess for signs of TTTS.
 She was advised that management of twin pregnancies will
 involve frequent ultrasound exams to assess the fetal growth
 and amniotic fluid level.  A follow-up ultrasound was
 scheduled at around 16 weeks to assess for signs of the twin
 to twin transfusion syndrome.  Following that exam, we will
 continue to follow her with biweekly ultrasounds to assess for
 the twin to twin transfusion syndrome.  A fetal anatomy scan
 will be  scheduled in our office at around 18-19 weeks. Twice-
 weekly fetal testing should be started at around 32 weeks.
 Delivery for uncomplicated monochorionic twins is
 recommended at around 37 weeks.
 The increased risk of congenital malformations,
 preeclampsia, gestational diabetes, and preterm birth/labor
 associated with twin pregnancies was discussed.  She was
 advised that we will continue to follow her closely to assess
 for these conditions. As pregnancies with multiple gestations
 are at increased risk for developing preeclampsia, she should
 start taking a daily baby aspirin (81 mg per day) to decrease
 her risk of developing preeclampsia  as soon as possible.
 The patient was asked if she had any unanswered questions.
 Any questions she had were answered to the best of our
 ability.The patient stated she did not have any unanswered
 questions.

## 2020-05-31 ENCOUNTER — Telehealth: Payer: Self-pay

## 2020-05-31 NOTE — Telephone Encounter (Signed)
Pt called wanting know why Tindamax was called into pharmacy vs. Vaginal cream. Explained to pt Dr. Macon Large phone tindamax in for recurrent BV. Pt verbalizes understanding.

## 2020-07-16 IMAGING — US US MFM OB LIMITED
1 series · 14 of 28 positions shown · non-contrast
Comparison: none

[Series 1: us mfm ob limited · 14 of 42 slices shown]
[im 2/42]
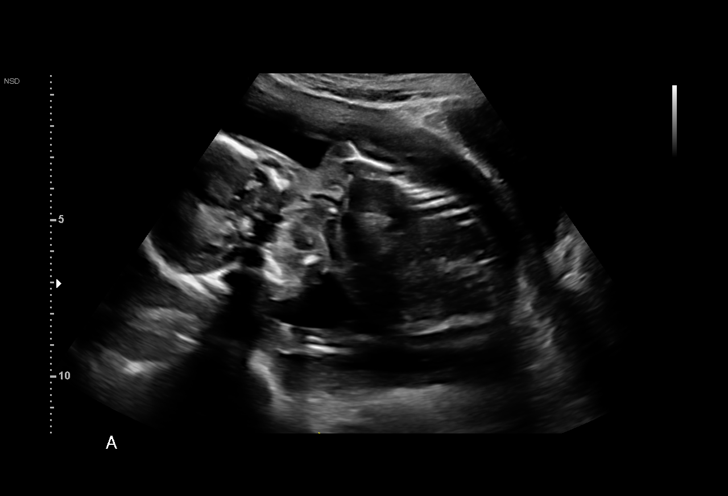
[im 5/42]
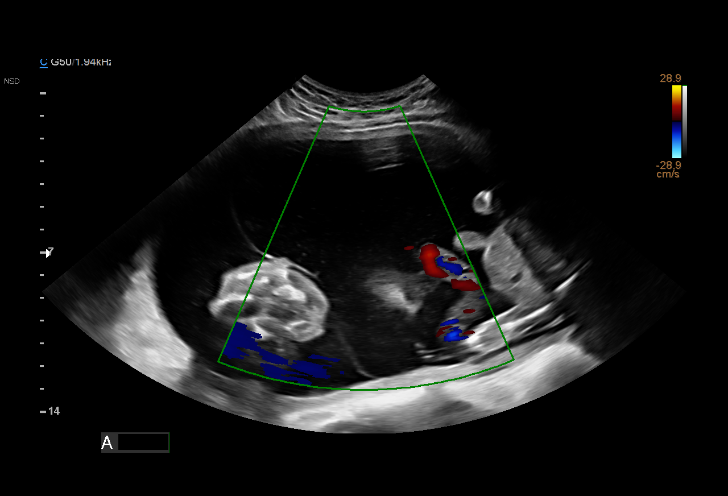
[im 8/42]
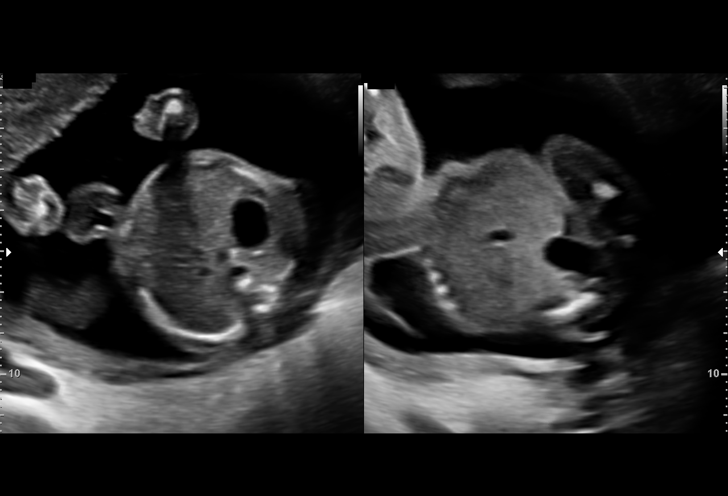
[im 11/42]
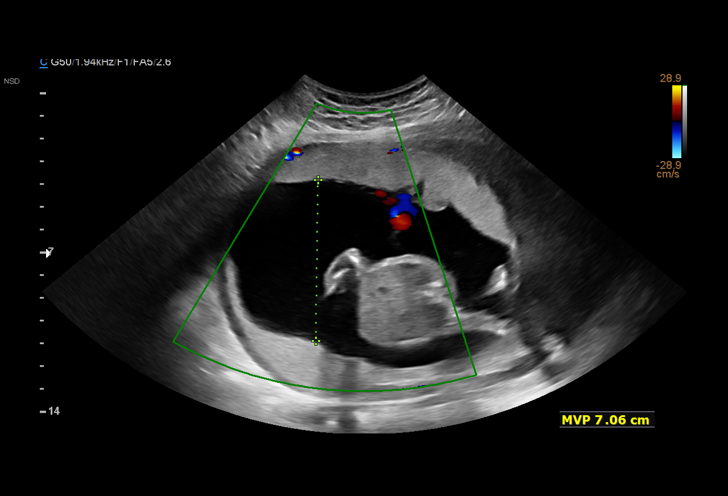
[im 14/42]
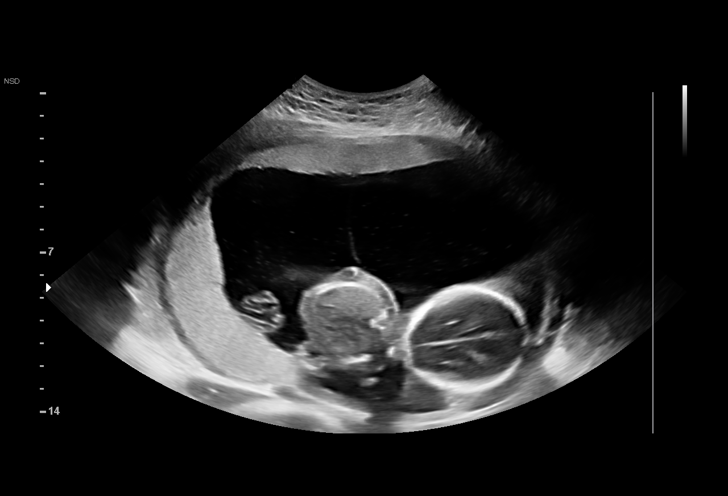
[im 17/42]
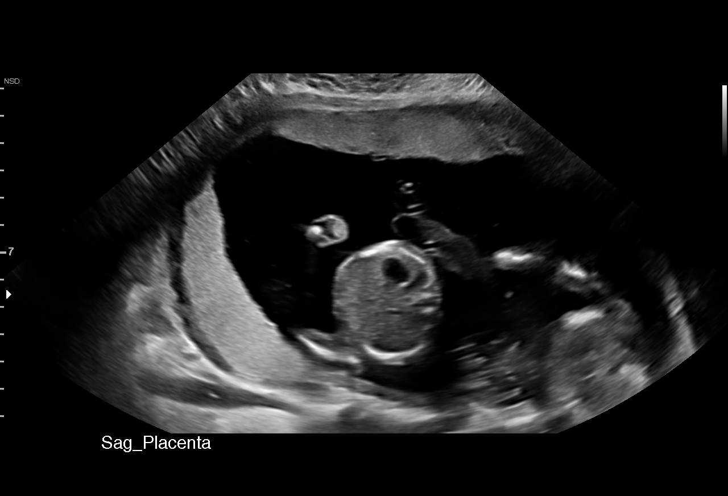
[im 20/42]
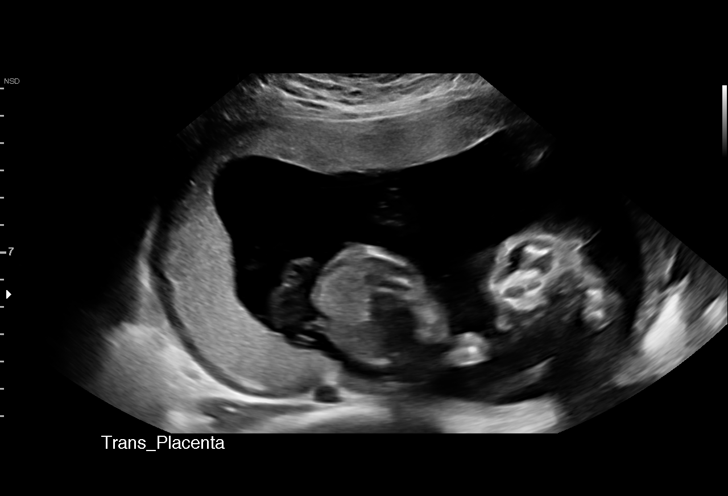
[im 23/42]
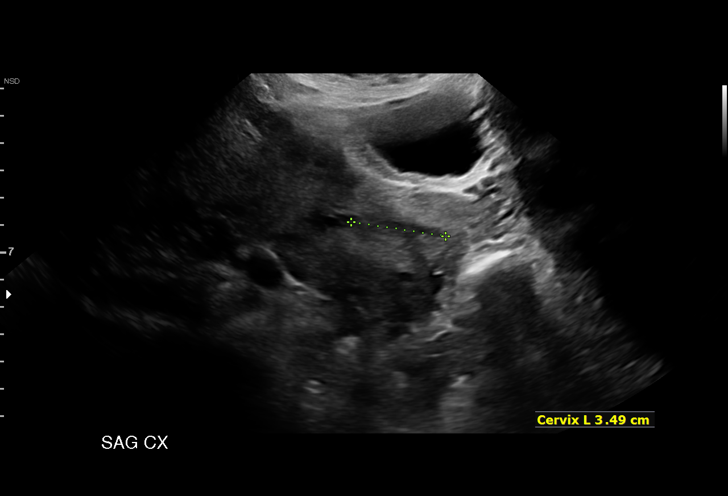
[im 26/42]
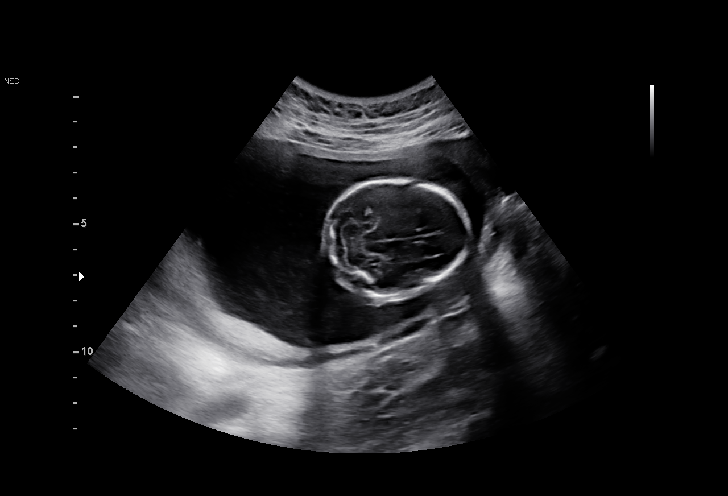
[im 29/42]
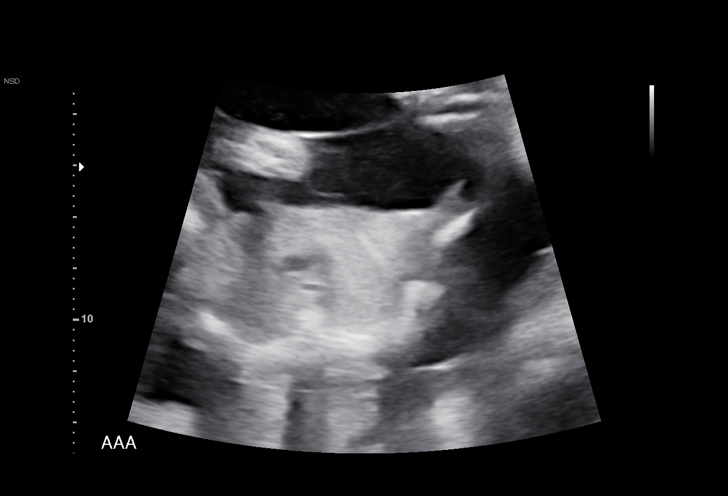
[im 32/42]
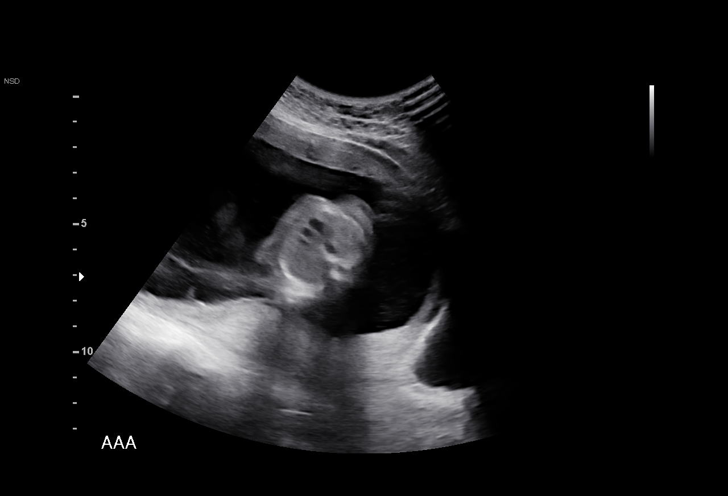
[im 35/42]
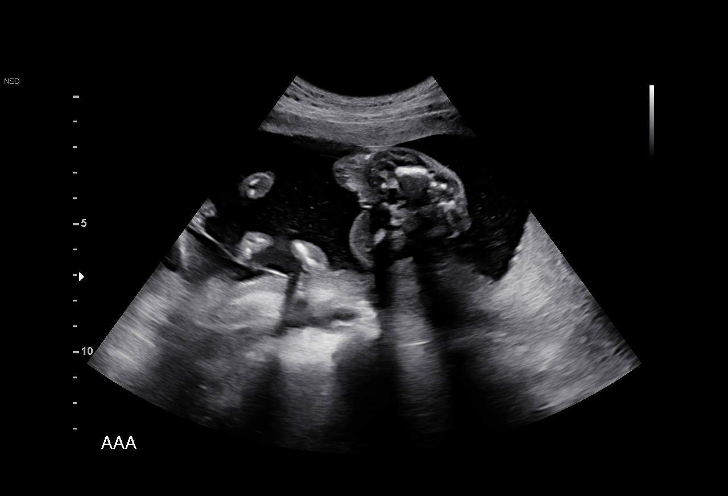
[im 38/42]
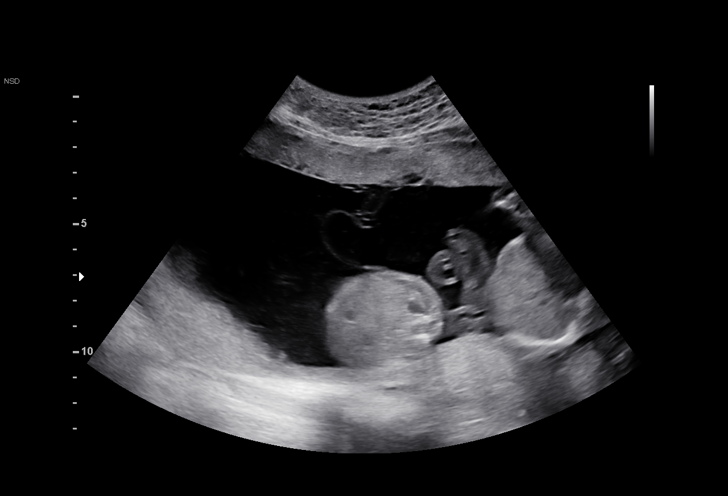
[im 42/42]
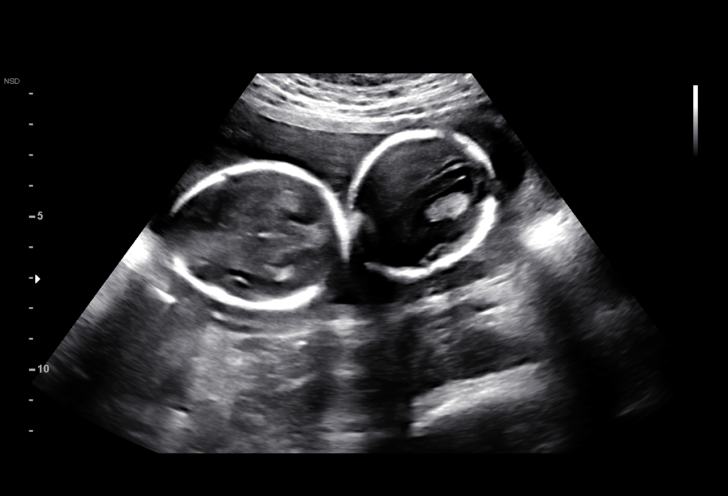

[14 of 28 positions shown; findings below may reference images not displayed]

----------------------------------------------------------------------

 ----------------------------------------------------------------------
Indications

  Twin pregnancy, Seth/Xigar, second trimester
  19 weeks gestation of pregnancy
  Poor obstetric history: Previous fetal growth
  restriction (FGR) (81w0d 2450g)
  Low Risk NIPS
  Encounter for antenatal screening for
  malformations
 ----------------------------------------------------------------------
Fetal Evaluation (Fetus A)

 Num Of Fetuses:         2
 Fetal Heart Rate(bpm):  134
 Cardiac Activity:       Observed
 Fetal Lie:              Lower Fetus Maternal left side
 Presentation:           Breech
 Placenta:               Fundal
 P. Cord Insertion:      Previously Visualized
 Membrane Desc:      Dividing Membrane seen - Monochorionic

 Amniotic Fluid
 AFI FV:      Subjectively increased

                             Largest Pocket(cm)

OB History

 Gravidity:    2         Term:   1        Prem:   0        SAB:   0
 TOP:          0       Ectopic:  0        Living: 1
Gestational Age (Fetus A)

 LMP:           19w 5d        Date:  11/28/18                 EDD:   09/04/19
 Best:          19w 5d     Det. By:  LMP  (11/28/18)          EDD:   09/04/19

Fetal Evaluation (Fetus B)

 Num Of Fetuses:         2
 Fetal Heart Rate(bpm):  134
 Cardiac Activity:       Observed
 Fetal Lie:              Upper Fetus
 Presentation:           Transverse, head to maternal left
 Placenta:               Fundal
 P. Cord Insertion:      Previously Visualized
 Membrane Desc:      Dividing Membrane seen - Monochorionic

 Amniotic Fluid
 AFI FV:      Within normal limits

                             Largest Pocket(cm)

Gestational Age (Fetus B)

 LMP:           19w 5d        Date:  11/28/18                 EDD:   09/04/19
 Best:          19w 5d     Det. By:  LMP  (11/28/18)          EDD:   09/04/19
Cervix Uterus Adnexa

 Cervix
 Length:           3.49  cm.
 Normal appearance by transabdominal scan.
Comments

 This patient was seen for a limited ultrasound today due to a
 monochorionic, diamniotic twin gestation.  The patient denies
 any problems since her last exam.

 Fetal movements were noted in both fetuses today.

 There were no signs of the twin to twin transfusion syndrome
 noted today.  A normal-appearing filled fetal bladder was
 noted in both fetuses.

 A follow-up exam was scheduled in 2 weeks.

## 2020-07-18 ENCOUNTER — Other Ambulatory Visit: Payer: Self-pay

## 2020-07-18 ENCOUNTER — Ambulatory Visit (INDEPENDENT_AMBULATORY_CARE_PROVIDER_SITE_OTHER): Payer: Medicaid Other | Admitting: Obstetrics & Gynecology

## 2020-07-18 ENCOUNTER — Encounter: Payer: Self-pay | Admitting: Obstetrics & Gynecology

## 2020-07-18 VITALS — BP 107/65 | HR 78 | Wt 157.0 lb

## 2020-07-18 DIAGNOSIS — L739 Follicular disorder, unspecified: Secondary | ICD-10-CM

## 2020-07-18 NOTE — Patient Instructions (Signed)

## 2020-07-18 NOTE — Progress Notes (Signed)
   GYNECOLOGY OFFICE VISIT NOTE  History:   Kelli Soto is a 25 y.o. [phone] here today for evaluation of bump on her vulva for a few days. Has started to get better. Happened after shaving. She denies any abnormal vaginal discharge, bleeding, pelvic pain or other concerns.    Past Medical History:  Diagnosis Date  . Anemia   . Bronchitis   . Chlamydia 02/19/2017  . Gonorrhea 2019  . Neutropenia [facility])     Past Surgical History:  Procedure Laterality Date  . CESAREAN SECTION MULTI-GESTATIONAL N/A 06/10/2019   Procedure: CESAREAN SECTION MULTI-GESTATIONAL;  Surgeon: Catalina Antigua, MD;  Location: [facility] LD ORS;  Service: Obstetrics;  Laterality: N/A;    The following portions of the patient's history were reviewed and updated as appropriate: allergies, current medications, past family history, past medical history, past social history, past surgical history and problem list.   Health Maintenance:  ASCUS pap and negative HRHPV on 12/11/2018   Review of Systems:  Pertinent items noted in HPI and remainder of comprehensive ROS otherwise negative.  Physical Exam:  BP 107/65   Pulse 78   Wt 157 lb (71.2 kg)   BMI 26.95 kg/m  CONSTITUTIONAL: Well-developed, well-nourished female in no acute distress.  HEENT:  Normocephalic, atraumatic. External right and left ear normal. No scleral icterus.  NECK: Normal range of motion, supple, no masses noted on observation SKIN: No rash noted. Not diaphoretic. No erythema. No pallor. MUSCULOSKELETAL: Normal range of motion. No edema noted. NEUROLOGIC: Alert and oriented to person, place, and time. Normal muscle tone coordination. No cranial nerve deficit noted. PSYCHIATRIC: Normal mood and affect. Normal behavior. Normal judgment and thought content. CARDIOVASCULAR: Normal heart rate noted RESPIRATORY: Effort and breath sounds normal, no problems with respiration noted ABDOMEN: No masses noted. No other overt distention noted.   PELVIC: Normal  appearing external genitalia, healing small folliculitis lesion at the border on superior left mons pubis with inner thigh. No erythema, no drainage.  Performed in the presence of a chaperone      Assessment and Plan:    1. Folliculitis of perineum Healing well, no intervention needed. Counseled patient about shaving her pubic hair, and possible consequences of ingrown hair. Alternatives discussed. Routine preventative health maintenance measures emphasized. Please refer to After Visit Summary for other counseling recommendations.   Return for any gynecologic concerns.    I spent 10 minutes dedicated to the care of this patient including pre-visit review of records, face to face time with the patient discussing her conditions and treatments and post visit ordering of testing.    Jaynie Collins, MD, FACOG Obstetrician & Gynecologist, [facility] for [employer], [facility]

## 2020-08-31 ENCOUNTER — Other Ambulatory Visit: Payer: Self-pay

## 2020-08-31 ENCOUNTER — Ambulatory Visit (INDEPENDENT_AMBULATORY_CARE_PROVIDER_SITE_OTHER): Payer: Medicaid Other

## 2020-08-31 ENCOUNTER — Other Ambulatory Visit (HOSPITAL_COMMUNITY)
Admission: RE | Admit: 2020-08-31 | Discharge: 2020-08-31 | Disposition: A | Discharge status: Home / Self Care | Payer: Medicaid Other | Source: Ambulatory Visit | Attending: Advanced Practice Midwife | Admitting: Advanced Practice Midwife

## 2020-08-31 VITALS — BP 127/66 | HR 92

## 2020-08-31 DIAGNOSIS — R109 Unspecified abdominal pain: Secondary | ICD-10-CM | POA: Insufficient documentation

## 2020-08-31 LAB — POCT URINALYSIS DIPSTICK
Leukocytes, UA: NEGATIVE
Nitrite, UA: NEGATIVE

## 2020-08-31 LAB — POCT URINE PREGNANCY: Preg Test, Ur: NEGATIVE

## 2020-08-31 NOTE — Progress Notes (Signed)
Patient was assessed and managed by nursing staff during this encounter. I have reviewed the chart and agree with the documentation and plan. I have also made any necessary editorial changes.  Jaynie Collins, MD 08/31/2020 12:10 PM

## 2020-08-31 NOTE — Progress Notes (Signed)
SUBJECTIVE:  25 y.o. female complains of abdominal pain. Denies abnormal vaginal bleeding or significant pelvic pain or fever. No UTI symptoms. Denies history of known exposure to STD.  No LMP recorded.  OBJECTIVE:  She appears well, afebrile. Urine dipstick: negative for all components.  ASSESSMENT:  Abdominal Pain      PLAN:  GC, chlamydia, trichomonas, BVAG, CVAG probe sent to lab. Treatment: To be determined once lab results are received ROV prn if symptoms persist or worsen.

## 2020-09-01 ENCOUNTER — Other Ambulatory Visit: Payer: Self-pay | Admitting: Obstetrics & Gynecology

## 2020-09-01 DIAGNOSIS — N760 Acute vaginitis: Secondary | ICD-10-CM

## 2020-09-01 DIAGNOSIS — B9689 Other specified bacterial agents as the cause of diseases classified elsewhere: Secondary | ICD-10-CM

## 2020-09-01 DIAGNOSIS — N898 Other specified noninflammatory disorders of vagina: Secondary | ICD-10-CM

## 2020-09-01 LAB — CERVICOVAGINAL ANCILLARY ONLY
Bacterial Vaginitis (gardnerella): POSITIVE — AB
Candida Glabrata: NEGATIVE
Candida Vaginitis: NEGATIVE
Chlamydia: NEGATIVE
Comment: NEGATIVE
Comment: NEGATIVE
Comment: NEGATIVE
Comment: NEGATIVE
Comment: NEGATIVE
Comment: NORMAL
Neisseria Gonorrhea: NEGATIVE
Trichomonas: NEGATIVE

## 2020-09-01 MED ORDER — TINIDAZOLE 500 MG PO TABS: 2.0000 g | ORAL_TABLET | Freq: Every day | ORAL | 2 refills | Status: DC

## 2020-09-10 IMAGING — US US MFM OB LIMITED
1 series · 14 of 27 positions shown · non-contrast
Comparison: none

[Series 1: us mfm ob limited · 27 acquisitions, 14 frames shown]
[im 1/27]
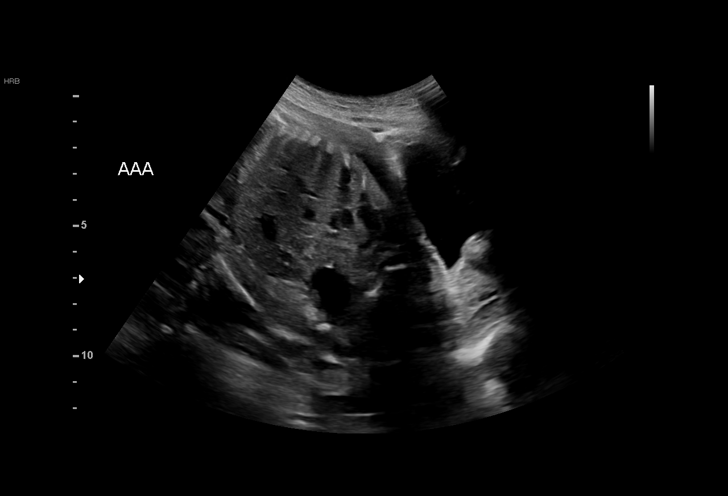
[im 3/27]
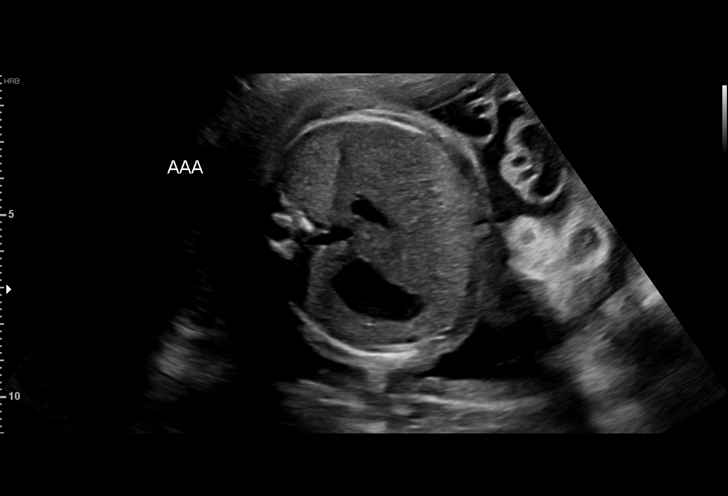
[im 5/27]
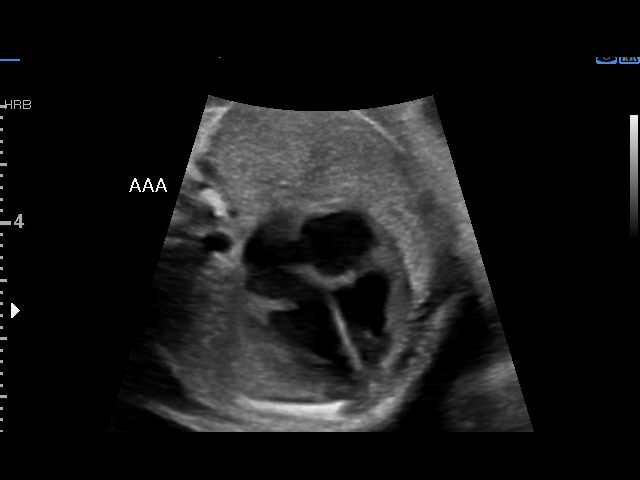
[im 7/27]
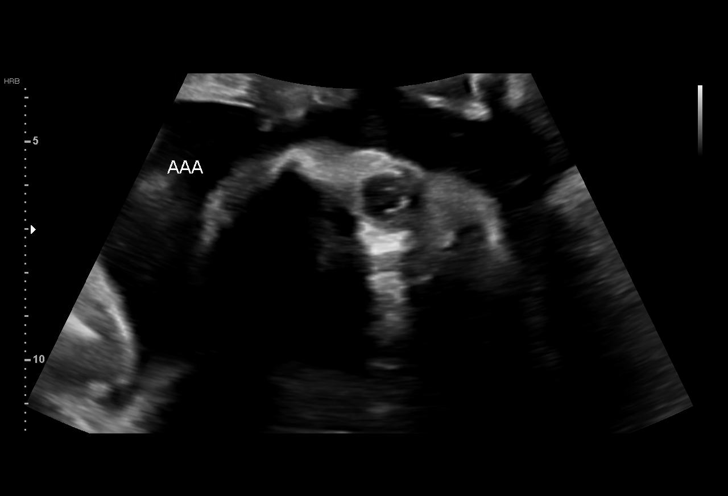
[im 9/27]
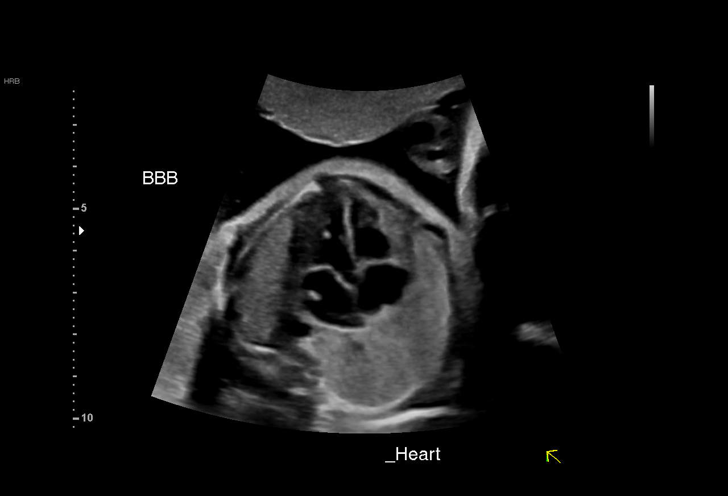
[im 11/27]
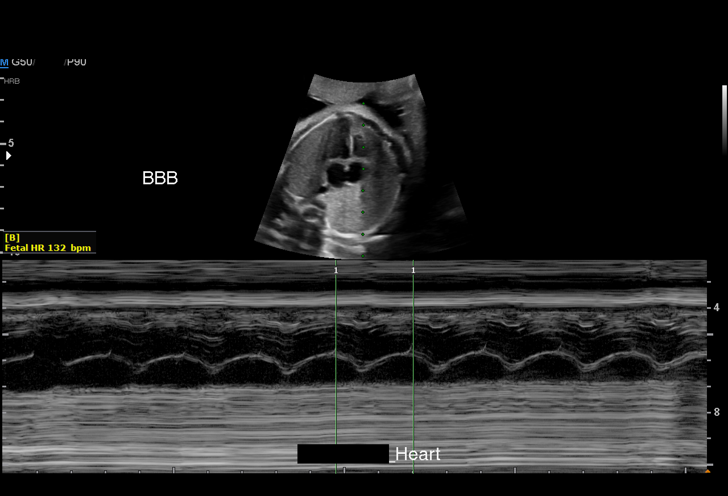
[im 13/27]
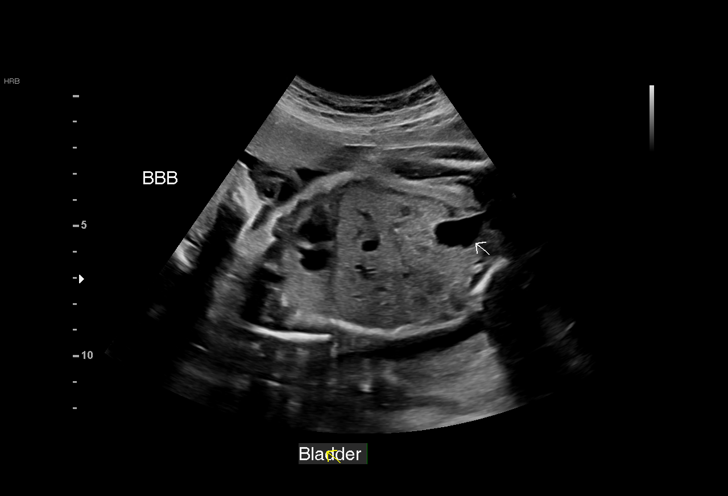
[im 15/27]
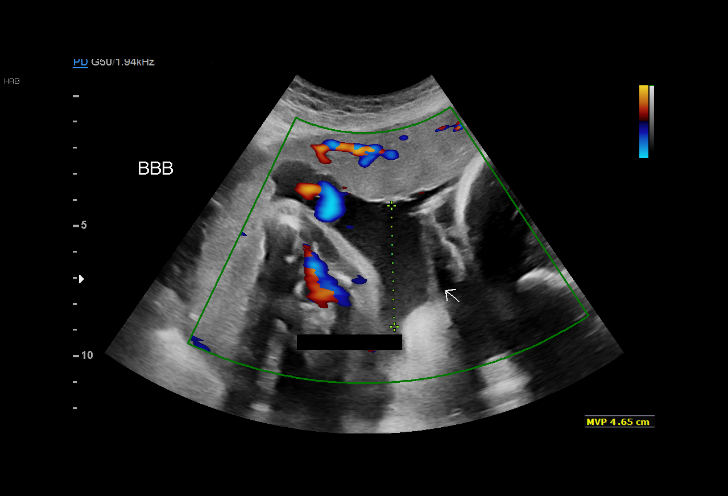
[im 17/27]
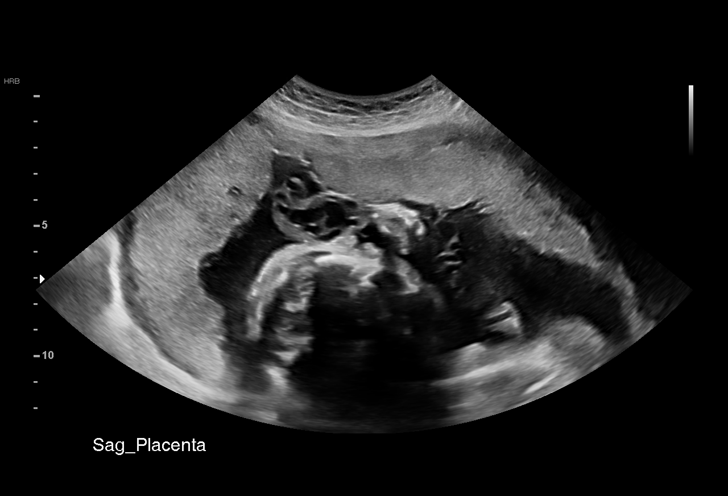
[im 19/27]
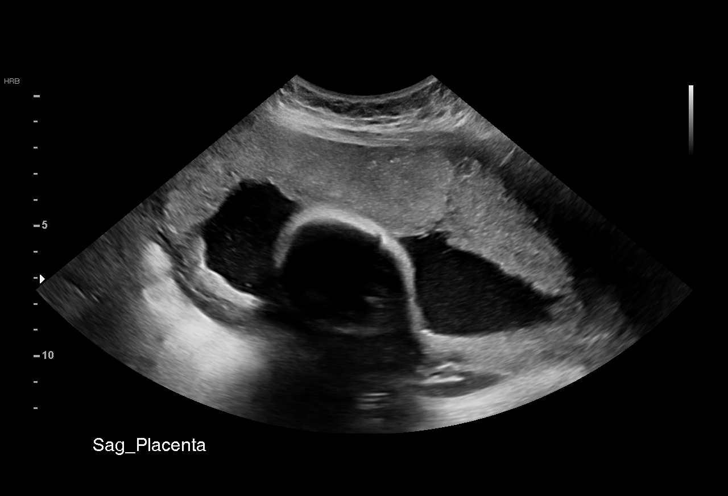
[im 21/27]
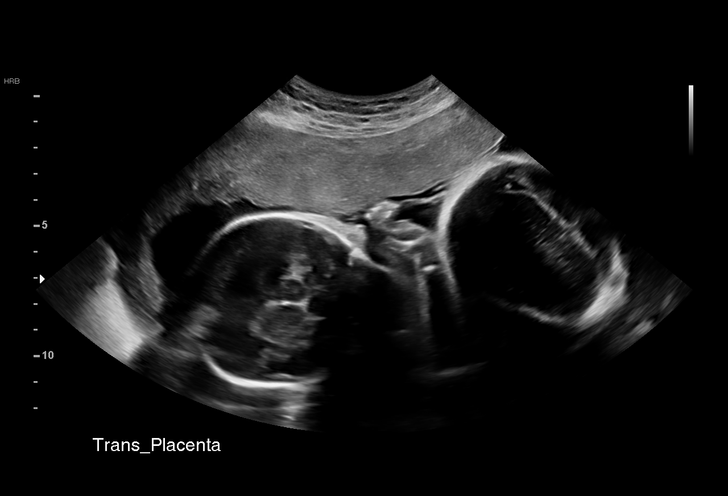
[im 23/27]
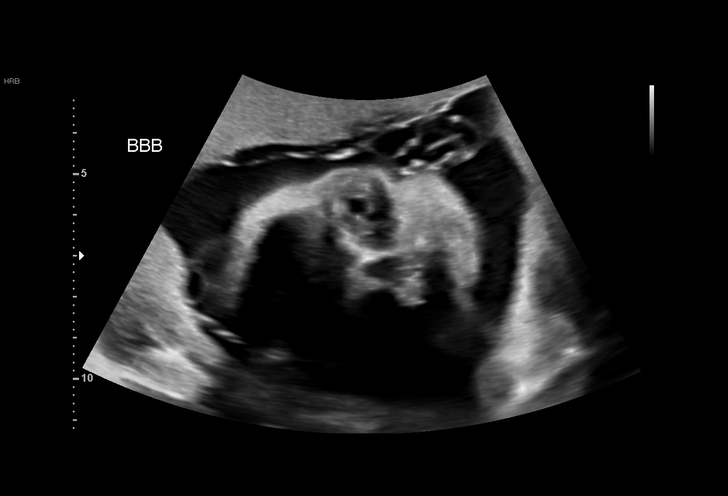
[im 25/27]
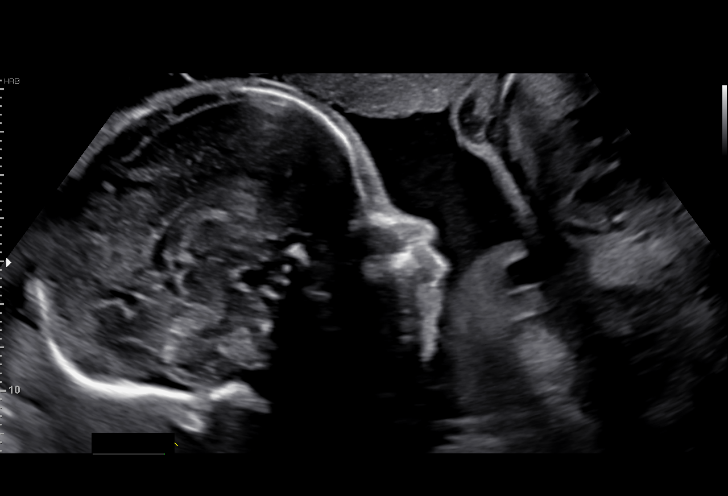
[im 27/27]
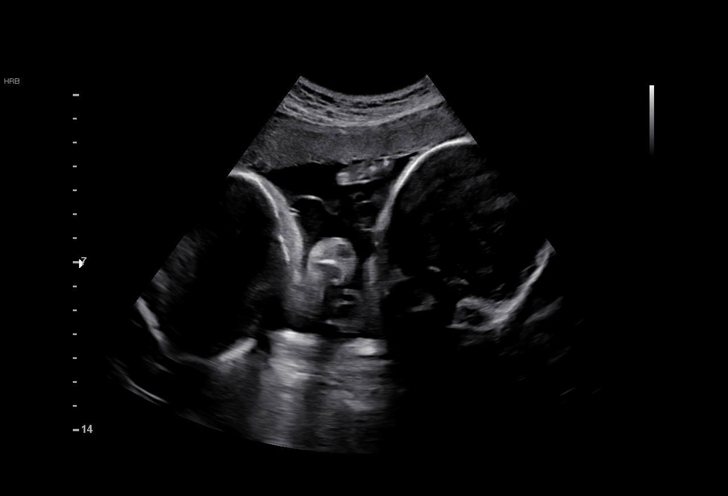

[14 of 27 positions shown; findings below may reference images not displayed]

Name:       KINTZIGER DANELUTTI                 Visit Date: 06/10/2019 [REDACTED]

 ----------------------------------------------------------------------

 ----------------------------------------------------------------------
Indications

  Twin pregnancy, Marevel/Nya, second trimester
  Encounter for other antenatal screening
  follow-up
  Poor obstetric history: Previous fetal growth
  restriction (FGR) (44w5d 2450g)
  Low Risk NIPS
  27 weeks gestation of pregnancy
 ----------------------------------------------------------------------
Vital Signs

 BMI:
Fetal Evaluation (Fetus A)

 Num Of Fetuses:         2
 Fetal Heart Rate(bpm):  129
 Cardiac Activity:       Observed
 Fetal Lie:              Lower Left
 Presentation:           Breech
 Placenta:               Anterior Fundal

 Amniotic Fluid
 AFI FV:      Within normal limits

                             Largest Pocket(cm)

OB History
 Gravidity:    2         Term:   1        Prem:   0        SAB:   0
 TOP:          0       Ectopic:  0        Living: 1
Gestational Age (Fetus A)

 LMP:           27w 5d        Date:  [DATE]                 EDD:   11/28/18
 Best:          27w 5d     Det. By:  LMP  (09/04/19)          EDD:   11/28/18
Anatomy (Fetus A)

 Thoracic:              Appears normal         Bladder:                Appears normal
 Stomach:               Appears normal, left
                        sided

Fetal Evaluation (Fetus B)

 Num Of Fetuses:         2
 Fetal Heart Rate(bpm):  132
 Cardiac Activity:       Observed
 Fetal Lie:              Upper Right
 Presentation:           Transverse, head to maternal right
 Placenta:               Anterior Fundal

 Amniotic Fluid
 AFI FV:      Within normal limits

                             Largest Pocket(cm)

Gestational Age (Fetus B)

 LMP:           27w 5d        Date:  09/04/19                 EDD:   11/28/18
 Best:          27w 5d     Det. By:  LMP  (09/04/19)          EDD:   11/28/18
Anatomy (Fetus B)

 Heart:                 Echogenic focus        Bladder:                Appears normal
                        in LV
 Stomach:               Appears normal, left
                        sided
Cervix Uterus Adnexa

 Cervix
 Not visualized (advanced GA >65wks)
Comments

 This patient has been followed due to a monochorionic,
 diamniotic twin gestation.  She denies any problems since
 her last exam.

 There was normal amniotic fluid noted around both twin A
 and twin B.

 There were no signs of the twin to twin transfusion syndrome
 noted today.
 She will return in 2 weeks for another ultrasound.

## 2020-10-13 ENCOUNTER — Other Ambulatory Visit: Payer: Self-pay

## 2020-10-13 ENCOUNTER — Ambulatory Visit
Admission: EM | Admit: 2020-10-13 | Discharge: 2020-10-13 | Disposition: A | Discharge status: Home / Self Care | Payer: Medicaid Other | Attending: Emergency Medicine | Admitting: Emergency Medicine

## 2020-10-13 DIAGNOSIS — B349 Viral infection, unspecified: Secondary | ICD-10-CM | POA: Diagnosis not present

## 2020-10-13 LAB — POCT RAPID STREP A (OFFICE): Rapid Strep A Screen: NEGATIVE

## 2020-10-13 MED ORDER — ONDANSETRON 4 MG PO TBDP: 4.0000 mg | ORAL_TABLET | Freq: Three times a day (TID) | ORAL | 0 refills | Status: DC | PRN

## 2020-10-13 NOTE — ED Provider Notes (Signed)
[name]    CSN: [id number] Arrival date & time: 10/13/20  1521      History   Chief Complaint Chief Complaint  Patient presents with  . Sore Throat    HPI Kelli Soto is a 25 y.o. female.   Patient presents with nausea, vomiting, diarrhea, abdominal pain, sore throat, nasal congestion, sinus pressure, shortness of breath x3 days.  She denies fever, rash, cough, or other symptoms.  2 episodes of emesis today.  No diarrhea today.  Treatment at home with OTC cold medication.  Her medical history includes anemia, neutropenia, migraine headaches.  LMP: current.  The history is provided by the patient and medical records.    Past Medical History:  Diagnosis Date  . Anemia   . Bronchitis   . Chlamydia 02/19/2017  . Gonorrhea 2019  . Neutropenia [facility])     Patient Active Problem List   Diagnosis Date Noted  . Migraine without aura and without status migrainosus, not intractable 12/24/2019  . Low iron 02/10/2019  . Low serum vitamin D 12/29/2018  . Neutropenia (HCC) 12/29/2018    Past Surgical History:  Procedure Laterality Date  . CESAREAN SECTION MULTI-GESTATIONAL N/A 06/10/2019   Procedure: CESAREAN SECTION MULTI-GESTATIONAL;  Surgeon: Catalina Antigua, MD;  Location: [facility] LD [facility]S;  Service: Obstetrics;  Laterality: N/A;    OB History    Gravida  2   Para  2   Term  1   Preterm  1   AB      Living  3     SAB      IAB      Ectopic      Multiple  1   Live Births  3            Home Medications    Prior to Admission medications   Medication Sig Start Date End Date Taking? Authorizing Provider  Cholecalciferol (VITAMIN D) 50 MCG (2000 UT) tablet Take 1 tablet (2,000 Units total) by mouth daily. 02/11/19   [place] Bing, MD  norelgestromin-ethinyl estradiol [employer]) 150-35 MCG/24HR transdermal patch Place 1 patch onto the skin once a week. For three weeks. Leave off for the 4th week to have your period 12/24/19   [place] Bing, MD   ondansetron (ZOFRAN ODT) 4 MG disintegrating tablet Take 1 tablet (4 mg total) by mouth every 8 (eight) hours as needed for nausea or vomiting. 10/13/20   Mickie Bail, NP  Prenatal Vit-Fe Fumarate-FA (PRENATAL VITAMIN PO) Take 1 tablet by mouth daily.     [provider]  tinidazole (TINDAMAX) 500 MG tablet Take 4 tablets (2,000 mg total) by mouth daily with breakfast. For two days 09/01/20   Anyanwu, Jethro Bastos, MD    Family History Family History  Problem Relation Age of Onset  . Hypertension Mother   . Diabetes Other     Social History Social History   Tobacco Use  . Smoking status: Never Smoker  . Smokeless tobacco: Never Used  Vaping Use  . Vaping Use: Never used  Substance Use Topics  . Alcohol use: No  . Drug use: Not Currently    Types: Marijuana    Comment: last smoked 2 weeks from August 28th     Allergies   Patient has no known allergies.   Review of Systems Review of Systems  Constitutional: Negative for chills and fever.  HENT: Positive for congestion, sinus pressure and sore throat. Negative for ear pain.   Respiratory: Positive for  shortness of breath. Negative for cough.   Cardiovascular: Negative for chest pain and palpitations.  Gastrointestinal: Positive for abdominal pain, diarrhea, nausea and vomiting.  Genitourinary: Negative for dysuria, flank pain, hematuria, pelvic pain and vaginal discharge.  Skin: Negative for color change and rash.  All other systems reviewed and are negative.    Physical Exam Triage Vital Signs ED Triage Vitals  Enc Vitals Group     BP      Pulse      Resp      Temp      Temp src      SpO2      Weight      Height      Head Circumference      Peak Flow      Pain Score      Pain Loc      Pain Edu?      Excl. in GC?    No data found.  Updated Vital Signs BP 109/68   Pulse 80   Temp 99.5 F (37.5 C)   Resp 19   LMP 10/11/2020   SpO2 95%   Visual Acuity Right Eye Distance:   Left Eye  Distance:   Bilateral Distance:    Right Eye Near:   Left Eye Near:    Bilateral Near:     Physical Exam Vitals and nursing note reviewed.  Constitutional:      General: She is not in acute distress.    Appearance: Normal appearance. She is well-developed. She is not ill-appearing.  HENT:     Head: Normocephalic and atraumatic.     Right Ear: Tympanic membrane normal.     Left Ear: Tympanic membrane normal.     Nose: Congestion present.     Mouth/Throat:     Mouth: Mucous membranes are moist.     Pharynx: Oropharynx is clear.  Eyes:     Conjunctiva/sclera: Conjunctivae normal.  Cardiovascular:     Rate and Rhythm: Normal rate and regular rhythm.     Heart sounds: Normal heart sounds.  Pulmonary:     Effort: Pulmonary effort is normal. No respiratory distress.     Breath sounds: Normal breath sounds.  Abdominal:     General: Bowel sounds are normal. There is no distension.     Palpations: Abdomen is soft.     Tenderness: There is no abdominal tenderness. There is no guarding or rebound.  Musculoskeletal:     Cervical back: Neck supple.  Skin:    General: Skin is warm and dry.     Findings: No rash.  Neurological:     General: No focal deficit present.     Mental Status: She is alert and oriented to person, place, and time.     Gait: Gait normal.  Psychiatric:        Mood and Affect: Mood normal.        Behavior: Behavior normal.      UC Treatments / Results  Labs (all labs ordered are listed, but only abnormal results are displayed) Labs Reviewed  NOVEL CORONAVIRUS, NAA  CULTURE, GROUP A STREP ([facility])  COVID-19, FLU A+B NAA  POCT RAPID STREP A (OFFICE)    EKG   Radiology No results found.  Procedures Procedures (including critical care time)  Medications Ordered in UC Medications - No data to display  Initial Impression / Assessment and Plan / UC Course  I have reviewed the triage vital signs and the nursing notes.  Pertinent  labs & imaging  results that were available during my care of the patient were reviewed by me and considered in my medical decision making (see chart for details).   Viral illness.  Rapid strep negative; culture pending.  Influenza and COVID pending.  Instructed patient to self quarantine until the test results are back.  I treating nausea and take with Zofran.  Instructed patient to stay hydrated with clear liquids.  Discussed other symptomatic treatment including Tylenol.  Instructed patient to follow up with PCP if her symptoms are not improving.  Patient agrees to plan of care.    Final Clinical Impressions(s) / UC Diagnoses   Final diagnoses:  Viral illness     Discharge Instructions     Your rapid strep test is negative.  A throat culture is pending; we will call you if it is positive requiring treatment.    Your COVID and Influenza tests are pending.  You should self quarantine until the test results are back.    Take the prescribed antinausea medicine as directed.  Keep yourself hydrated with clear liquids such as water.  Take Tylenol as needed for fever or discomfort.      Follow-up with your primary care provider if your symptoms are not improving.        ED Prescriptions    Medication Sig Dispense Auth. Provider   ondansetron (ZOFRAN ODT) 4 MG disintegrating tablet  (Status: Discontinued) Take 1 tablet (4 mg total) by mouth every 8 (eight) hours as needed for nausea or vomiting. 20 tablet Mickie Bail, NP   ondansetron (ZOFRAN ODT) 4 MG disintegrating tablet Take 1 tablet (4 mg total) by mouth every 8 (eight) hours as needed for nausea or vomiting. 20 tablet Mickie Bail, NP     PDMP not reviewed this encounter.   Mickie Bail, NP 10/13/20 [phone]

## 2020-10-13 NOTE — Discharge Instructions (Signed)
Your rapid strep test is negative.  A throat culture is pending; we will call you if it is positive requiring treatment.    Your COVID and Influenza tests are pending.  You should self quarantine until the test results are back.    Take the prescribed antinausea medicine as directed.  Keep yourself hydrated with clear liquids such as water.  Take Tylenol as needed for fever or discomfort.      Follow-up with your primary care provider if your symptoms are not improving.

## 2020-10-13 NOTE — ED Triage Notes (Signed)
Pt presents with complaints of n/v, facial pressure, nasal congestion, headache, and generalized abdominal pain x 3 days.

## 2020-10-14 LAB — COVID-19, FLU A+B NAA
Influenza A, NAA: NOT DETECTED
Influenza B, NAA: NOT DETECTED
SARS-CoV-2, NAA: NOT DETECTED

## 2020-10-16 LAB — CULTURE, GROUP A STREP (THRC)

## 2020-11-08 ENCOUNTER — Encounter: Payer: Self-pay | Admitting: Radiology

## 2020-12-26 ENCOUNTER — Other Ambulatory Visit: Payer: Self-pay | Admitting: Obstetrics and Gynecology

## 2021-01-09 ENCOUNTER — Other Ambulatory Visit: Payer: Self-pay | Admitting: *Deleted

## 2021-01-09 NOTE — Telephone Encounter (Signed)
erroneous

## 2021-01-13 ENCOUNTER — Ambulatory Visit (INDEPENDENT_AMBULATORY_CARE_PROVIDER_SITE_OTHER): Payer: Medicaid Other

## 2021-01-13 ENCOUNTER — Other Ambulatory Visit (HOSPITAL_COMMUNITY)
Admission: RE | Admit: 2021-01-13 | Discharge: 2021-01-13 | Disposition: A | Discharge status: Home / Self Care | Payer: Medicaid Other | Source: Ambulatory Visit | Attending: Obstetrics & Gynecology | Admitting: Obstetrics & Gynecology

## 2021-01-13 ENCOUNTER — Other Ambulatory Visit: Payer: Self-pay

## 2021-01-13 VITALS — BP 121/74 | HR 56

## 2021-01-13 DIAGNOSIS — N898 Other specified noninflammatory disorders of vagina: Secondary | ICD-10-CM | POA: Diagnosis present

## 2021-01-13 DIAGNOSIS — B3731 Acute candidiasis of vulva and vagina: Secondary | ICD-10-CM

## 2021-01-13 DIAGNOSIS — Z113 Encounter for screening for infections with a predominantly sexual mode of transmission: Secondary | ICD-10-CM | POA: Insufficient documentation

## 2021-01-13 DIAGNOSIS — B373 Candidiasis of vulva and vagina: Secondary | ICD-10-CM

## 2021-01-13 DIAGNOSIS — R109 Unspecified abdominal pain: Secondary | ICD-10-CM

## 2021-01-13 DIAGNOSIS — N760 Acute vaginitis: Secondary | ICD-10-CM

## 2021-01-13 DIAGNOSIS — B9689 Other specified bacterial agents as the cause of diseases classified elsewhere: Secondary | ICD-10-CM

## 2021-01-13 NOTE — Progress Notes (Signed)
SUBJECTIVE:  25 y.o. female complains of foul vaginal odor for a "few weeks" and abdominal cramping. Denies abnormal vaginal bleeding or significant pelvic pain or fever. No UTI symptoms. Denies history of known exposure to STD.  No LMP recorded.  OBJECTIVE:  She appears well, afebrile. Urine dipstick: not done.  ASSESSMENT:  Vaginal Odor Abdominal Cramping     PLAN:  GC, chlamydia, trichomonas, BVAG, CVAG probe sent to lab. Treatment: To be determined once lab results are received ROV prn if symptoms persist or worsen. Pt offered STI blood work but decided to complete blood work at annual on 01/31/2021 with Dr. Vergie Living. Pt instructed to be hydrated the morning of since pt reports she is a "hard stick."

## 2021-01-16 LAB — CERVICOVAGINAL ANCILLARY ONLY
Bacterial Vaginitis (gardnerella): POSITIVE — AB
Candida Glabrata: NEGATIVE
Candida Vaginitis: POSITIVE — AB
Chlamydia: NEGATIVE
Comment: NEGATIVE
Comment: NEGATIVE
Comment: NEGATIVE
Comment: NEGATIVE
Comment: NEGATIVE
Comment: NORMAL
Neisseria Gonorrhea: NEGATIVE
Trichomonas: NEGATIVE

## 2021-01-16 MED ORDER — TINIDAZOLE 500 MG PO TABS
2.0000 g | ORAL_TABLET | Freq: Every day | ORAL | 2 refills | Status: DC
Start: 2021-01-16 — End: 2023-10-17

## 2021-01-16 MED ORDER — FLUCONAZOLE 150 MG PO TABS
150.0000 mg | ORAL_TABLET | Freq: Once | ORAL | 3 refills | Status: AC
Start: 2021-01-16 — End: 2021-01-16

## 2021-01-16 NOTE — Progress Notes (Signed)
Patient was assessed and managed by nursing staff during this encounter. I have reviewed the chart and agree with the documentation and plan. I have also made any necessary editorial changes.  Catalina Antigua, MD 01/16/2021 10:20 AM

## 2021-01-16 NOTE — Addendum Note (Signed)
Addended by: Jaynie Collins A on: 01/16/2021 04:12 PM   Modules accepted: Orders

## 2021-01-31 ENCOUNTER — Ambulatory Visit: Payer: Medicaid Other | Admitting: Obstetrics and Gynecology

## 2021-04-03 ENCOUNTER — Other Ambulatory Visit: Payer: Self-pay | Admitting: *Deleted

## 2021-04-03 MED ORDER — FLUCONAZOLE 150 MG PO TABS: 150.0000 mg | ORAL_TABLET | Freq: Once | ORAL | 3 refills | Status: AC

## 2021-04-03 MED ORDER — METRONIDAZOLE 500 MG PO TABS: 500.0000 mg | ORAL_TABLET | Freq: Two times a day (BID) | ORAL | 0 refills | Status: DC

## 2021-04-03 MED ORDER — METRONIDAZOLE 0.75 % VA GEL: 1.0000 | Freq: Every day | VAGINAL | 1 refills | Status: DC

## 2021-04-03 NOTE — Telephone Encounter (Signed)
Pt called asking for meds for BV and yeast, having discharge and odor, has history of both.

## 2021-04-11 ENCOUNTER — Ambulatory Visit: Payer: Medicaid Other | Admitting: Obstetrics and Gynecology

## 2021-05-10 ENCOUNTER — Other Ambulatory Visit: Payer: Self-pay

## 2021-05-10 ENCOUNTER — Encounter: Payer: Self-pay | Admitting: Emergency Medicine

## 2021-05-10 ENCOUNTER — Ambulatory Visit
Admission: EM | Admit: 2021-05-10 | Discharge: 2021-05-10 | Disposition: A | Discharge status: Home / Self Care | Payer: Medicaid Other | Attending: Internal Medicine | Admitting: Internal Medicine

## 2021-05-10 DIAGNOSIS — Z1152 Encounter for screening for COVID-19: Secondary | ICD-10-CM | POA: Diagnosis not present

## 2021-05-10 DIAGNOSIS — A084 Viral intestinal infection, unspecified: Secondary | ICD-10-CM

## 2021-05-10 MED ORDER — ONDANSETRON 4 MG PO TBDP: 4.0000 mg | ORAL_TABLET | Freq: Three times a day (TID) | ORAL | 0 refills | Status: DC | PRN

## 2021-05-10 NOTE — ED Triage Notes (Signed)
Pt here with fever, bodyaches and weakness, and emesis x 2 days.

## 2021-05-10 NOTE — Discharge Instructions (Signed)
Increase oral fluid intake Tylenol/Motrin as needed for pain and/or fever Return to urgent care if symptoms worsen We will call you if symptoms are abnormal.

## 2021-05-10 NOTE — ED Provider Notes (Signed)
[facility]    CSN: [id number] Arrival date & time: 05/10/21  1048      History   Chief Complaint Chief Complaint  Patient presents with   Emesis   Generalized Body Aches   Fever    HPI Kelli Soto is a 25 y.o. female comes to the urgent care with 4-day history of generalized body aches, nonbilious nonbloody vomiting and fever.  Patient symptoms started 4 days ago and has been persistent.  Fever was initially 102 Fahrenheit and associated with generalized body aches and a headache.  Vomiting is a few times a day.  No diarrhea.  Patient admits to having abdominal cramps.  She admits to having sick contacts from her twin children.  Patient's twin children were tested for COVID-19, flu and RSV and the test came out negative.Marland Kitchen   HPI  Past Medical History:  Diagnosis Date   Anemia    Bronchitis    Chlamydia 02/19/2017   Gonorrhea 2019   Neutropenia [facility])     Patient Active Problem List   Diagnosis Date Noted   Migraine without aura and without status migrainosus, not intractable 12/24/2019   Low iron 02/10/2019   Low serum vitamin D 12/29/2018   Neutropenia (HCC) 12/29/2018    Past Surgical History:  Procedure Laterality Date   CESAREAN SECTION MULTI-GESTATIONAL N/A 06/10/2019   Procedure: CESAREAN SECTION MULTI-GESTATIONAL;  Surgeon: Catalina Antigua, MD;  Location: [facility] LD ORS;  Service: Obstetrics;  Laterality: N/A;    OB History     Gravida  2   Para  2   Term  1   Preterm  1   AB      Living  3      SAB      IAB      Ectopic      Multiple  1   Live Births  3            Home Medications    Prior to Admission medications   Medication Sig Start Date End Date Taking? Authorizing Provider  metroNIDAZOLE (FLAGYL) 500 MG tablet Take 1 tablet (500 mg total) by mouth 2 (two) times daily. 04/03/21   Anyanwu, Jethro Bastos, MD  metroNIDAZOLE (METROGEL) 0.75 % vaginal gel Place 1 Applicatorful vaginally at bedtime. Apply one applicatorful to  vagina at bedtime for 5 days 04/03/21   Anyanwu, Jethro Bastos, MD  ondansetron (ZOFRAN ODT) 4 MG disintegrating tablet Take 1 tablet (4 mg total) by mouth every 8 (eight) hours as needed for nausea or vomiting. 05/10/21   Merrilee Jansky, MD  tinidazole (TINDAMAX) 500 MG tablet Take 4 tablets (2,000 mg total) by mouth daily with breakfast. For two days 01/16/21   Anyanwu, Jethro Bastos, MD  Burr Medico 150-35 MCG/24HR transdermal patch PLACE 1 PATCH ONTO THE SKIN ONCE A WEEK FOR 3 WEEKS LEAVE OFF FOR THE 4TH TO HAVE YOUR PERIOD 01/09/21   [place] Bing, MD    Family History Family History  Problem Relation Age of Onset   Hypertension Mother    Diabetes Other     Social History Social History   Tobacco Use   Smoking status: Never   Smokeless tobacco: Never  Vaping Use   Vaping Use: Never used  Substance Use Topics   Alcohol use: No   Drug use: Not Currently    Types: Marijuana    Comment: last smoked 2 weeks from August 28th     Allergies   Patient has no known allergies.  Review of Systems Review of Systems  Constitutional:  Positive for chills and fever.  HENT: Negative.  Negative for sore throat.   Cardiovascular: Negative.   Gastrointestinal:  Positive for abdominal pain, nausea and vomiting. Negative for diarrhea.  Genitourinary: Negative.   Musculoskeletal:  Positive for myalgias. Negative for arthralgias.  Neurological:  Positive for headaches.    Physical Exam Triage Vital Signs ED Triage Vitals  Enc Vitals Group     BP 05/10/21 1112 108/68     Pulse Rate 05/10/21 1112 98     Resp 05/10/21 1112 20     Temp 05/10/21 1112 99.1 F (37.3 C)     Temp Source 05/10/21 1112 Oral     SpO2 05/10/21 1112 98 %     Weight --      Height --      Head Circumference --      Peak Flow --      Pain Score 05/10/21 1113 5     Pain Loc --      Pain Edu? --      Excl. in GC? --    No data found.  Updated Vital Signs BP 108/68   Pulse 98   Temp 99.1 F (37.3 C) (Oral)    Resp 20   SpO2 98%   Visual Acuity Right Eye Distance:   Left Eye Distance:   Bilateral Distance:    Right Eye Near:   Left Eye Near:    Bilateral Near:     Physical Exam Vitals and nursing note reviewed.  Constitutional:      Appearance: Normal appearance.  HENT:     Right Ear: Tympanic membrane normal.     Left Ear: Tympanic membrane normal.  Cardiovascular:     Rate and Rhythm: Normal rate and regular rhythm.     Pulses: Normal pulses.     Heart sounds: Normal heart sounds.  Pulmonary:     Effort: Pulmonary effort is normal.     Breath sounds: Normal breath sounds.  Abdominal:     General: Bowel sounds are normal.     Palpations: Abdomen is soft.  Musculoskeletal:        General: Normal range of motion.  Neurological:     Mental Status: She is alert.     UC Treatments / Results  Labs (all labs ordered are listed, but only abnormal results are displayed) Labs Reviewed  COVID-19, FLU A+B NAA    EKG   Radiology No results found.  Procedures Procedures (including critical care time)  Medications Ordered in UC Medications - No data to display  Initial Impression / Assessment and Plan / UC Course  I have reviewed the triage vital signs and the nursing notes.  Pertinent labs & imaging results that were available during my care of the patient were reviewed by me and considered in my medical decision making (see chart for details).     1.  Viral gastroenteritis: COVID-19/flu a plus B Increase oral fluid intake Zofran as needed for nausea/vomiting We will call you with recommendations if labs are abnormal Return to urgent care if symptoms worsen.  Final Clinical Impressions(s) / UC Diagnoses   Final diagnoses:  Encounter for screening for COVID-19  Viral gastroenteritis     Discharge Instructions      Increase oral fluid intake Tylenol/Motrin as needed for pain and/or fever Return to urgent care if symptoms worsen We will call you if  symptoms are abnormal.   ED Prescriptions  Medication Sig Dispense Auth. Provider   ondansetron (ZOFRAN ODT) 4 MG disintegrating tablet Take 1 tablet (4 mg total) by mouth every 8 (eight) hours as needed for nausea or vomiting. 20 tablet Vadie Principato, Britta Mccreedy, MD      PDMP not reviewed this encounter.   Merrilee Jansky, MD 05/10/21 1141

## 2021-05-12 LAB — COVID-19, FLU A+B NAA
Influenza A, NAA: DETECTED — AB
Influenza B, NAA: NOT DETECTED
SARS-CoV-2, NAA: NOT DETECTED

## 2021-10-03 ENCOUNTER — Ambulatory Visit (INDEPENDENT_AMBULATORY_CARE_PROVIDER_SITE_OTHER): Payer: Medicaid Other | Admitting: *Deleted

## 2021-10-03 ENCOUNTER — Other Ambulatory Visit (HOSPITAL_COMMUNITY)
Admission: RE | Admit: 2021-10-03 | Discharge: 2021-10-03 | Disposition: A | Discharge status: Home / Self Care | Payer: Medicaid Other | Source: Ambulatory Visit | Attending: Obstetrics & Gynecology | Admitting: Obstetrics & Gynecology

## 2021-10-03 VITALS — BP 112/69 | HR 87

## 2021-10-03 DIAGNOSIS — Z3202 Encounter for pregnancy test, result negative: Secondary | ICD-10-CM

## 2021-10-03 DIAGNOSIS — Z113 Encounter for screening for infections with a predominantly sexual mode of transmission: Secondary | ICD-10-CM | POA: Diagnosis present

## 2021-10-03 DIAGNOSIS — B3731 Acute candidiasis of vulva and vagina: Secondary | ICD-10-CM

## 2021-10-03 DIAGNOSIS — N760 Acute vaginitis: Secondary | ICD-10-CM

## 2021-10-03 LAB — POCT URINE PREGNANCY: Preg Test, Ur: NEGATIVE

## 2021-10-03 NOTE — Progress Notes (Signed)
SUBJECTIVE:  ?26 y.o. female who desires a STI screen. ?Denies abnormal vaginal discharge, bleeding or significant pelvic pain. No UTI symptoms. Denies history of known exposure to STD. Pt also request a UPT. ? ?No LMP recorded. ? ?OBJECTIVE:  ?She appears well. ?UPT in office -negative ? ? ?ASSESSMENT:  ?STI Screen  ? ? ?PLAN:  ?Pt offered STI blood screening-requested ?GC, chlamydia, and trichomonas probe sent to lab.  ?Treatment: To be determined once lab results are received. ? ?Pt follow up as needed.  ? ?Scheryl Marten, RN  ?

## 2021-10-04 LAB — CERVICOVAGINAL ANCILLARY ONLY
Bacterial Vaginitis (gardnerella): POSITIVE — AB
Candida Glabrata: NEGATIVE
Candida Vaginitis: POSITIVE — AB
Chlamydia: NEGATIVE
Comment: NEGATIVE
Comment: NEGATIVE
Comment: NEGATIVE
Comment: NEGATIVE
Comment: NEGATIVE
Comment: NORMAL
Neisseria Gonorrhea: NEGATIVE
Trichomonas: NEGATIVE

## 2021-10-04 LAB — RPR: RPR Ser Ql: NONREACTIVE

## 2021-10-04 LAB — HIV ANTIBODY (ROUTINE TESTING W REFLEX): HIV Screen 4th Generation wRfx: NONREACTIVE

## 2021-10-04 LAB — HEPATITIS B SURFACE ANTIGEN: Hepatitis B Surface Ag: NEGATIVE

## 2021-10-04 LAB — HEPATITIS C ANTIBODY: Hep C Virus Ab: NONREACTIVE

## 2021-10-04 MED ORDER — FLUCONAZOLE 150 MG PO TABS: 150.0000 mg | ORAL_TABLET | Freq: Once | ORAL | 3 refills | Status: AC

## 2021-10-04 MED ORDER — METRONIDAZOLE 500 MG PO TABS: 500.0000 mg | ORAL_TABLET | Freq: Two times a day (BID) | ORAL | 0 refills | Status: AC

## 2021-10-04 NOTE — Addendum Note (Signed)
Addended by: Jaynie Collins A on: 10/04/2021 12:26 PM ? ? Modules accepted: Orders ? ?

## 2021-11-22 ENCOUNTER — Ambulatory Visit (INDEPENDENT_AMBULATORY_CARE_PROVIDER_SITE_OTHER): Payer: Medicaid Other

## 2021-11-22 ENCOUNTER — Ambulatory Visit
Admission: EM | Admit: 2021-11-22 | Discharge: 2021-11-22 | Disposition: A | Discharge status: Home / Self Care | Payer: Medicaid Other | Attending: Emergency Medicine | Admitting: Emergency Medicine

## 2021-11-22 ENCOUNTER — Encounter: Payer: Self-pay | Admitting: Emergency Medicine

## 2021-11-22 DIAGNOSIS — R0789 Other chest pain: Secondary | ICD-10-CM | POA: Insufficient documentation

## 2021-11-22 DIAGNOSIS — R0602 Shortness of breath: Secondary | ICD-10-CM

## 2021-11-22 DIAGNOSIS — R079 Chest pain, unspecified: Secondary | ICD-10-CM

## 2021-11-22 LAB — TROPONIN I (HIGH SENSITIVITY): Troponin I (High Sensitivity): 3 ng/L (ref <18)

## 2021-11-22 LAB — CBC WITH DIFFERENTIAL/PLATELET
Abs Immature Granulocytes: 0 10*3/uL (ref 0.00–0.07)
Basophils Absolute: 0 10*3/uL (ref 0.0–0.1)
Basophils Relative: 1 %
Eosinophils Absolute: 0 10*3/uL (ref 0.0–0.5)
Eosinophils Relative: 1 %
HCT: 35 % — ABNORMAL LOW (ref 36.0–46.0)
Hemoglobin: 11.7 g/dL — ABNORMAL LOW (ref 12.0–15.0)
Immature Granulocytes: 0 %
Lymphocytes Relative: 43 %
Lymphs Abs: 1.8 10*3/uL (ref 0.7–4.0)
MCH: 29.5 pg (ref 26.0–34.0)
MCHC: 33.4 g/dL (ref 30.0–36.0)
MCV: 88.2 fL (ref 80.0–100.0)
Monocytes Absolute: 0.4 10*3/uL (ref 0.1–1.0)
Monocytes Relative: 8 %
Neutro Abs: 2 10*3/uL (ref 1.7–7.7)
Neutrophils Relative %: 47 %
Platelets: 277 10*3/uL (ref 150–400)
RBC: 3.97 MIL/uL (ref 3.87–5.11)
RDW: 13.3 % (ref 11.5–15.5)
WBC: 4.2 10*3/uL (ref 4.0–10.5)
nRBC: 0 % (ref 0.0–0.2)

## 2021-11-22 LAB — COMPREHENSIVE METABOLIC PANEL
ALT: 18 U/L (ref 0–44)
AST: 26 U/L (ref 15–41)
Albumin: 4 g/dL (ref 3.5–5.0)
Alkaline Phosphatase: 45 U/L (ref 38–126)
Anion gap: 7 (ref 5–15)
BUN: 11 mg/dL (ref 6–20)
CO2: 26 mmol/L (ref 22–32)
Calcium: 9.1 mg/dL (ref 8.9–10.3)
Chloride: 105 mmol/L (ref 98–111)
Creatinine, Ser: 0.71 mg/dL (ref 0.44–1.00)
GFR, Estimated: >60 mL/min (ref >60)
Glucose, Bld: 81 mg/dL (ref 70–99)
Potassium: 3.7 mmol/L (ref 3.5–5.1)
Sodium: 138 mmol/L (ref 135–145)
Total Bilirubin: 0.6 mg/dL (ref 0.3–1.2)
Total Protein: 7.8 g/dL (ref 6.5–8.1)

## 2021-11-22 MED ORDER — HYDROXYZINE HCL 25 MG PO TABS: 25.0000 mg | ORAL_TABLET | Freq: Four times a day (QID) | ORAL | 0 refills | Status: DC

## 2021-11-22 NOTE — Discharge Instructions (Addendum)
Your blood work and chest x-ray were all very reassuring and did not show any signs of abnormalities that may be causing your chest pain.  Your anemia has actually improved from 2020.  Your EKG was unremarkable as well.  It is very possible that your chest pain and shortness of breath may be coming from anxiety.  I am going to prescribe you hydroxyzine and you can take 1/2 to 1 tablet every 6 hours as needed for anxiety symptoms.  These could include chest pain, shortness of breath, dizziness, cramping in the hands and feet, numbness in the hands, feet, or around her mouth.  This medicine may make you sleepy.  I recommend that you follow-up with your OB/GYN to discuss other treatment options and a possible referral to a therapist.

## 2021-11-22 NOTE — ED Provider Notes (Signed)
[facility]    CSN: [id number] Arrival date & time: 11/22/21  0912      History   Chief Complaint Chief Complaint  Patient presents with   Chest Pain    HPI Lachrisha A Schirmer is a 26 y.o. female.   HPI  26 year old female here for evaluation of chest pain.  Patient reports that she has been experiencing continuous pain in the left side of her chest that radiates to her neck and is associated with shortness of breath.  She states that when she got home from work last night she experienced heaviness in the middle of her chest that was still present this morning.  The shortness of breath is also present.  She went to work and states that the shortness of breath increased and this brought on a crying episode.  She has had some mild dizziness and nausea.  She states that taking a deep breath does improve her symptoms and she cannot think of anything that aggravates her symptoms.  She reports that she has had off and on similar symptoms for approximately 1 month but in the last week or so they become more constant.  Does not associate with any sweating.  Patient does have a history of anemia and neutropenia.  She has not had any blood work since 2020.  She has an OB/GYN that she uses as a PCP.  Past Medical History:  Diagnosis Date   Anemia    Bronchitis    Chlamydia 02/19/2017   Gonorrhea 2019   Neutropenia [facility])     Patient Active Problem List   Diagnosis Date Noted   Migraine without aura and without status migrainosus, not intractable 12/24/2019   Low iron 02/10/2019   Low serum vitamin D 12/29/2018   Neutropenia (HCC) 12/29/2018    Past Surgical History:  Procedure Laterality Date   CESAREAN SECTION MULTI-GESTATIONAL N/A 06/10/2019   Procedure: CESAREAN SECTION MULTI-GESTATIONAL;  Surgeon: Catalina Antigua, MD;  Location: [facility] LD ORS;  Service: Obstetrics;  Laterality: N/A;    OB History     Gravida  2   Para  2   Term  1   Preterm  1   AB      Living   3      SAB      IAB      Ectopic      Multiple  1   Live Births  3            Home Medications    Prior to Admission medications   Medication Sig Start Date End Date Taking? Authorizing Provider  hydrOXYzine (ATARAX) 25 MG tablet Take 1 tablet (25 mg total) by mouth every 6 (six) hours. 11/22/21  Yes Becky Augusta, NP  metroNIDAZOLE (METROGEL) 0.75 % vaginal gel Place 1 Applicatorful vaginally at bedtime. Apply one applicatorful to vagina at bedtime for 5 days 04/03/21   Anyanwu, Jethro Bastos, MD  ondansetron (ZOFRAN ODT) 4 MG disintegrating tablet Take 1 tablet (4 mg total) by mouth every 8 (eight) hours as needed for nausea or vomiting. 05/10/21   Merrilee Jansky, MD  tinidazole (TINDAMAX) 500 MG tablet Take 4 tablets (2,000 mg total) by mouth daily with breakfast. For two days 01/16/21   Anyanwu, Jethro Bastos, MD  Burr Medico 150-35 MCG/24HR transdermal patch PLACE 1 PATCH ONTO THE SKIN ONCE A WEEK FOR 3 WEEKS LEAVE OFF FOR THE 4TH TO HAVE YOUR PERIOD 01/09/21   [place] Bing, MD    Family  History Family History  Problem Relation Age of Onset   Hypertension Mother    Diabetes Other     Social History Social History   Tobacco Use   Smoking status: Never   Smokeless tobacco: Never  Vaping Use   Vaping Use: Never used  Substance Use Topics   Alcohol use: No   Drug use: Not Currently    Types: Marijuana    Comment: last smoked 2 weeks from August 28th     Allergies   Patient has no known allergies.   Review of Systems Review of Systems  Constitutional:  Negative for diaphoresis.  Respiratory:  Positive for chest tightness and shortness of breath.   Cardiovascular:  Positive for chest pain. Negative for palpitations.  Gastrointestinal:  Positive for nausea.  Neurological:  Positive for dizziness.  Hematological: Negative.   Psychiatric/Behavioral: Negative.      Physical Exam Triage Vital Signs ED Triage Vitals  Enc Vitals Group     BP      Pulse      Resp       Temp      Temp src      SpO2      Weight      Height      Head Circumference      Peak Flow      Pain Score      Pain Loc      Pain Edu?      Excl. in GC?    No data found.  Updated Vital Signs BP 119/73 (BP Location: Right Arm)   Pulse 63   Temp 98.3 F (36.8 C) (Oral)   Resp 16   SpO2 98%   Visual Acuity Right Eye Distance:   Left Eye Distance:   Bilateral Distance:    Right Eye Near:   Left Eye Near:    Bilateral Near:     Physical Exam Vitals and nursing note reviewed.  Constitutional:      Appearance: Normal appearance. She is not ill-appearing.  HENT:     Head: Normocephalic and atraumatic.     Mouth/Throat:     Mouth: Mucous membranes are moist.     Pharynx: Oropharynx is clear. No oropharyngeal exudate or posterior oropharyngeal erythema.     Comments: Mildly pale oral mucous membranes. Eyes:     Extraocular Movements: Extraocular movements intact.     Pupils: Pupils are equal, round, and reactive to light.     Comments: Conjunctiva are mildly pale.  Cardiovascular:     Rate and Rhythm: Normal rate and regular rhythm.     Pulses: Normal pulses.     Heart sounds: Normal heart sounds. No murmur heard.   No friction rub. No gallop.  Pulmonary:     Effort: Pulmonary effort is normal.     Breath sounds: Normal breath sounds. No wheezing, rhonchi or rales.  Musculoskeletal:     Cervical back: Normal range of motion and neck supple.  Lymphadenopathy:     Cervical: No cervical adenopathy.  Skin:    General: Skin is warm and dry.     Capillary Refill: Capillary refill takes less than 2 seconds.     Coloration: Skin is pale.  Neurological:     General: No focal deficit present.     Mental Status: She is alert and oriented to person, place, and time.  Psychiatric:        Mood and Affect: Mood normal.  Behavior: Behavior normal.        Thought Content: Thought content normal.        Judgment: Judgment normal.     UC Treatments / Results   Labs (all labs ordered are listed, but only abnormal results are displayed) Labs Reviewed  CBC WITH DIFFERENTIAL/PLATELET - Abnormal; Notable for the following components:      Result Value   Hemoglobin 11.7 (*)    HCT 35.0 (*)    All other components within normal limits  COMPREHENSIVE METABOLIC PANEL  TROPONIN I (HIGH SENSITIVITY)    EKG  Sinus bradycardia with ventricular rate of 57 bpm .  Of 146 ms QRS duration 82 ms QT/QTc 436/424 ms Patient has mild flattening of her T waves in V1 but there are no other reciprocal changes. No significant change when compared to 04/25/2019 EKG.   Radiology DG Chest 2 View  Result Date: 11/22/2021 CLINICAL DATA:  Chest pain and shortness of breath. EXAM: CHEST - 2 VIEW COMPARISON:  07/10/2018 FINDINGS: The heart size and mediastinal contours are within normal limits. Both lungs are clear. The visualized skeletal structures are unremarkable. IMPRESSION: No active cardiopulmonary disease. Electronically Signed   By: Kennith Center M.D.   On: 11/22/2021 10:07    Procedures Procedures (including critical care time)  Medications Ordered in UC Medications - No data to display  Initial Impression / Assessment and Plan / UC Course  I have reviewed the triage vital signs and the nursing notes.  Pertinent labs & imaging results that were available during my care of the patient were reviewed by me and considered in my medical decision making (see chart for details).  Patient is a nontoxic-appearing 26 year old female here for evaluation of left-sided chest pain that is been off and on for the past month but more constant over the last week.  Since last night she has had continuous left-sided chest pain with radiation to her back, chest tightness, and shortness of breath.  Also endorses intermittent nausea and mild dizziness from time to time.  Her physical exam reveals S1-S2 heart sounds with regular rate and rhythm and lung sounds that are clear to  auscultation in all fields.  Peripheral pulses are 2+.  Patient does have mildly pale conjunctiva and oral mucous membranes.  She does have significant history of anemia as well as neutropenia.  She has been evaluated by hematology oncology for neutropenia with no definitive diagnosis.  Upon reviewing her blood work in epic she had neutropenia in July 2020 which resolved itself 5 days later.  5 consecutive lab draws do not indicate any signs of neutropenia as she has a normal white count with exception of 06/11/2019 when she had elevated white count of 16.1.  She does have a significant history of anemia her last CBC on 06/11/2019 showed an H&H of 9.7 and 28.4.  Her platelets were 158.  EKG was collected at triage and shows sinus bradycardia with a rate of 57 bpm.  There is some flattening of her ST segment in V1 but no other ST or T wave abnormalities noted.  I did compare her EKG to another one in epic from 04/25/2019 and there is no change.  I am concerned that her chest pain/pressure and shortness of breath may be related to anemia.  I do not think that it is her heart.  We will check CBC, CMP, and troponin.  We will also obtain chest x-ray.  CBC shows an H&H of 11.7 and 35.0.  WBC count is normal at 4.2 and RBC count is normal at 3.7.  Platelets are 277.  CMP is unremarkable.  Sodium is 138, potassium 3.7, glucose 81, BUN 11, creatinine 0.71, transaminases are normal.  High-sensitivity troponin is 3.  Chest x-ray independently reviewed and evaluated by me.  Impression: The lung spaces are well pneumatized and there is no evidence of infiltrate or effusion.  Radiology overread is pending. Radiology findings states no active cardiopulmonary disease.  It is unclear as to etiology of patient's chest pain.  Her CBC is markedly improved from her last CBC of 06/11/2019 when her H&H was 9.7 and 28.4.  Patient symptoms may very well be coming from anxiety.  I will discharge her home with hydroxyzine and have  her follow-up with her GYN whom she uses a primary care provider.   Final Clinical Impressions(s) / UC Diagnoses   Final diagnoses:  Atypical chest pain     Discharge Instructions      Your blood work and chest x-ray were all very reassuring and did not show any signs of abnormalities that may be causing your chest pain.  Your anemia has actually improved from 2020.  Your EKG was unremarkable as well.  It is very possible that your chest pain and shortness of breath may be coming from anxiety.  I am going to prescribe you hydroxyzine and you can take 1/2 to 1 tablet every 6 hours as needed for anxiety symptoms.  These could include chest pain, shortness of breath, dizziness, cramping in the hands and feet, numbness in the hands, feet, or around her mouth.  This medicine may make you sleepy.  I recommend that you follow-up with your OB/GYN to discuss other treatment options and a possible referral to a therapist.     ED Prescriptions     Medication Sig Dispense Auth. Provider   hydrOXYzine (ATARAX) 25 MG tablet Take 1 tablet (25 mg total) by mouth every 6 (six) hours. 30 tablet Becky Augusta, NP      PDMP not reviewed this encounter.   Becky Augusta, NP 11/22/21 1045

## 2021-11-22 NOTE — ED Triage Notes (Signed)
Patient presents to Urgent Care with complaints of chest pressure since last night. She reports she has intermittent chest pain x 1 month. She states the sharp pain is located on her left side that radiates to her left neck area. She states this morning at work she felt SOB, felt very hot. She has noted she has felt overwhelmed and unsure if it related to anxiety.

## 2021-12-11 ENCOUNTER — Ambulatory Visit: Payer: Medicaid Other

## 2021-12-11 ENCOUNTER — Ambulatory Visit
Admission: EM | Admit: 2021-12-11 | Discharge: 2021-12-11 | Disposition: A | Discharge status: Home / Self Care | Payer: Medicaid Other | Attending: Emergency Medicine | Admitting: Emergency Medicine

## 2021-12-11 DIAGNOSIS — N760 Acute vaginitis: Secondary | ICD-10-CM | POA: Insufficient documentation

## 2021-12-11 DIAGNOSIS — R1084 Generalized abdominal pain: Secondary | ICD-10-CM | POA: Insufficient documentation

## 2021-12-11 DIAGNOSIS — R519 Headache, unspecified: Secondary | ICD-10-CM | POA: Insufficient documentation

## 2021-12-11 DIAGNOSIS — R42 Dizziness and giddiness: Secondary | ICD-10-CM | POA: Insufficient documentation

## 2021-12-11 DIAGNOSIS — J029 Acute pharyngitis, unspecified: Secondary | ICD-10-CM | POA: Diagnosis present

## 2021-12-11 DIAGNOSIS — Z3202 Encounter for pregnancy test, result negative: Secondary | ICD-10-CM | POA: Diagnosis not present

## 2021-12-11 DIAGNOSIS — Z113 Encounter for screening for infections with a predominantly sexual mode of transmission: Secondary | ICD-10-CM | POA: Insufficient documentation

## 2021-12-11 LAB — POCT URINALYSIS DIP (MANUAL ENTRY)
Bilirubin, UA: NEGATIVE
Blood, UA: NEGATIVE
Glucose, UA: NEGATIVE mg/dL
Ketones, POC UA: NEGATIVE mg/dL
Leukocytes, UA: NEGATIVE
Nitrite, UA: NEGATIVE
Protein Ur, POC: NEGATIVE mg/dL
Spec Grav, UA: 1.025 (ref 1.010–1.025)
Urobilinogen, UA: 2 E.U./dL — AB
pH, UA: 6.5 (ref 5.0–8.0)

## 2021-12-11 LAB — POCT RAPID STREP A (OFFICE): Rapid Strep A Screen: NEGATIVE

## 2021-12-11 LAB — POCT URINE PREGNANCY: Preg Test, Ur: NEGATIVE

## 2021-12-11 MED ORDER — ONDANSETRON 4 MG PO TBDP: 4.0000 mg | ORAL_TABLET | Freq: Three times a day (TID) | ORAL | 0 refills | Status: DC | PRN

## 2021-12-11 NOTE — ED Provider Notes (Signed)
Kelli Soto    CSN: [id number] Arrival date & time: 12/11/21  4098      History   Chief Complaint Chief Complaint  Patient presents with   Sore Throat    HPI Kelli Soto is a 26 y.o. female.  Patient presents with sore throat since this morning.  She also reports nausea, abdominal cramping, headache, and dizziness since last night.  No fever, chills, rash, cough, shortness of breath, chest pain, dysuria, vaginal discharge, pelvic pain, or other symptoms.  No treatments at home.  Patient also requests pregnancy test and STD testing; she declines blood work for HIV or syphilis.  Her medical history includes migraine headache, neutropenia, anemia.  The history is provided by the patient and medical records.    Past Medical History:  Diagnosis Date   Anemia    Bronchitis    Chlamydia 02/19/2017   Gonorrhea 2019   Neutropenia [facility])     Patient Active Problem List   Diagnosis Date Noted   Migraine without aura and without status migrainosus, not intractable 12/24/2019   Low iron 02/10/2019   Low serum vitamin D 12/29/2018   Neutropenia (HCC) 12/29/2018    Past Surgical History:  Procedure Laterality Date   CESAREAN SECTION MULTI-GESTATIONAL N/A 06/10/2019   Procedure: CESAREAN SECTION MULTI-GESTATIONAL;  Surgeon: Catalina Antigua, MD;  Location: [facility] LD ORS;  Service: Obstetrics;  Laterality: N/A;    OB History     Gravida  2   Para  2   Term  1   Preterm  1   AB      Living  3      SAB      IAB      Ectopic      Multiple  1   Live Births  3            Home Medications    Prior to Admission medications   Medication Sig Start Date End Date Taking? Authorizing Provider  ondansetron (ZOFRAN-ODT) 4 MG disintegrating tablet Take 1 tablet (4 mg total) by mouth every 8 (eight) hours as needed for nausea or vomiting. 12/11/21  Yes Mickie Bail, NP  hydrOXYzine (ATARAX) 25 MG tablet Take 1 tablet (25 mg total) by mouth every 6 (six) hours.  11/22/21   Becky Augusta, NP  metroNIDAZOLE (METROGEL) 0.75 % vaginal gel Place 1 Applicatorful vaginally at bedtime. Apply one applicatorful to vagina at bedtime for 5 days 04/03/21   Anyanwu, Jethro Bastos, MD  tinidazole (TINDAMAX) 500 MG tablet Take 4 tablets (2,000 mg total) by mouth daily with breakfast. For two days 01/16/21   Anyanwu, Jethro Bastos, MD  Burr Medico 150-35 MCG/24HR transdermal patch PLACE 1 PATCH ONTO THE SKIN ONCE A WEEK FOR 3 WEEKS LEAVE OFF FOR THE 4TH TO HAVE YOUR PERIOD 01/09/21   [place] Bing, MD    Family History Family History  Problem Relation Age of Onset   Hypertension Mother    Healthy Father    Diabetes Other     Social History Social History   Tobacco Use   Smoking status: Never   Smokeless tobacco: Never  Vaping Use   Vaping Use: Never used  Substance Use Topics   Alcohol use: No   Drug use: Not Currently    Types: Marijuana    Comment: last smoked 2 weeks from August 28th     Allergies   Patient has no known allergies.   Review of Systems Review of Systems  Constitutional:  Negative for chills and fever.  HENT:  Positive for sore throat. Negative for ear pain.   Respiratory:  Negative for cough and shortness of breath.   Cardiovascular:  Negative for chest pain and palpitations.  Gastrointestinal:  Positive for abdominal pain and nausea. Negative for constipation, diarrhea and vomiting.  Genitourinary:  Negative for dysuria, flank pain, hematuria, pelvic pain and vaginal discharge.  Skin:  Negative for color change and rash.  Neurological:  Positive for dizziness and headaches. Negative for weakness and numbness.  All other systems reviewed and are negative.    Physical Exam Triage Vital Signs ED Triage Vitals  Enc Vitals Group     BP      Pulse      Resp      Temp      Temp src      SpO2      Weight      Height      Head Circumference      Peak Flow      Pain Score      Pain Loc      Pain Edu?      Excl. in GC?    No data  found.  Updated Vital Signs BP 108/67   Pulse (!) 57   Temp 97.8 F (36.6 C)   Resp 18   LMP 11/13/2021   SpO2 98%   Visual Acuity Right Eye Distance:   Left Eye Distance:   Bilateral Distance:    Right Eye Near:   Left Eye Near:    Bilateral Near:     Physical Exam Vitals and nursing note reviewed.  Constitutional:      General: She is not in acute distress.    Appearance: Normal appearance. She is well-developed. She is not ill-appearing.  HENT:     Right Ear: Tympanic membrane normal.     Left Ear: Tympanic membrane normal.     Nose: Nose normal.     Mouth/Throat:     Mouth: Mucous membranes are moist.     Pharynx: Oropharynx is clear.  Cardiovascular:     Rate and Rhythm: Normal rate and regular rhythm.     Heart sounds: Normal heart sounds.  Pulmonary:     Effort: Pulmonary effort is normal. No respiratory distress.     Breath sounds: Normal breath sounds.  Abdominal:     General: Bowel sounds are normal.     Palpations: Abdomen is soft.     Tenderness: There is no abdominal tenderness. There is no guarding or rebound.  Musculoskeletal:     Cervical back: Neck supple.  Skin:    General: Skin is warm and dry.  Neurological:     General: No focal deficit present.     Mental Status: She is alert and oriented to person, place, and time.     Gait: Gait normal.  Psychiatric:        Mood and Affect: Mood normal.        Behavior: Behavior normal.      UC Treatments / Results  Labs (all labs ordered are listed, but only abnormal results are displayed) Labs Reviewed  POCT URINALYSIS DIP (MANUAL ENTRY) - Abnormal; Notable for the following components:      Result Value   Urobilinogen, UA 2.0 (*)    All other components within normal limits  POCT RAPID STREP A (OFFICE)  POCT URINE PREGNANCY  CERVICOVAGINAL ANCILLARY ONLY    EKG   Radiology No results  found.  Procedures Procedures (including critical care time)  Medications Ordered in  UC Medications - No data to display  Initial Impression / Assessment and Plan / UC Course  I have reviewed the triage vital signs and the nursing notes.  Pertinent labs & imaging results that were available during my care of the patient were reviewed by me and considered in my medical decision making (see chart for details).    STD screening, negative pregnancy test, abdominal pain, sore throat, dizziness, headache.  Patient is well-appearing and her exam is reassuring.  Afebrile, vital signs stable.  Rapid strep negative.  Urine pregnancy negative.  Urine shows no sign of infection.  Patient obtained vaginal self swab for STD testing.  She declines blood work for HIV and syphilis.  Instructed her to abstain from sexual activity until the test results are back.  Discussed that we will call her if the test results are positive requiring treatment.  Instructed patient to take Tylenol or ibuprofen as needed.  Instructed her to establish a PCP as soon as possible and [place] assistance with this requested.  Education provided on abdominal pain, sore throat, dizziness, headache.  ED precautions discussed.  Work note provided per patient request.  Patient agrees to plan of care.  Final Clinical Impressions(s) / UC Diagnoses   Final diagnoses:  Screening for STD (sexually transmitted disease)  Negative pregnancy test  Generalized abdominal pain  Sore throat  Dizziness  Acute nonintractable headache, unspecified headache type     Discharge Instructions      The strep test is negative.  The pregnancy test is negative.  Your urine does not show signs of infection.    Your vaginal tests are pending.  If your test results are positive, we will call you.  You and your sexual partner(s) may require treatment at that time.  Do not have sexual activity for at least 7 days.    Take Tylenol or ibuprofen as needed for fever or discomfort.  Follow up with your primary care provider if your symptoms are  not improving.             ED Prescriptions     Medication Sig Dispense Auth. Provider   ondansetron (ZOFRAN-ODT) 4 MG disintegrating tablet Take 1 tablet (4 mg total) by mouth every 8 (eight) hours as needed for nausea or vomiting. 20 tablet Mickie Bail, NP      PDMP not reviewed this encounter.   Mickie Bail, NP 12/11/21 [phone]

## 2021-12-12 LAB — CERVICOVAGINAL ANCILLARY ONLY
Bacterial Vaginitis (gardnerella): POSITIVE — AB
Candida Glabrata: NEGATIVE
Candida Vaginitis: NEGATIVE
Chlamydia: NEGATIVE
Comment: NEGATIVE
Comment: NEGATIVE
Comment: NEGATIVE
Comment: NEGATIVE
Comment: NEGATIVE
Comment: NORMAL
Neisseria Gonorrhea: NEGATIVE
Trichomonas: NEGATIVE

## 2021-12-13 ENCOUNTER — Telehealth (HOSPITAL_COMMUNITY): Payer: Self-pay | Admitting: Emergency Medicine

## 2021-12-13 MED ORDER — METRONIDAZOLE 0.75 % VA GEL: 1.0000 | Freq: Every day | VAGINAL | 0 refills | Status: AC

## 2021-12-15 ENCOUNTER — Encounter (HOSPITAL_COMMUNITY): Payer: Self-pay

## 2022-01-19 ENCOUNTER — Other Ambulatory Visit: Payer: Self-pay | Admitting: Obstetrics and Gynecology

## 2022-02-06 ENCOUNTER — Ambulatory Visit
Admission: EM | Admit: 2022-02-06 | Discharge: 2022-02-06 | Disposition: A | Discharge status: Home / Self Care | Payer: Medicaid Other | Attending: Emergency Medicine | Admitting: Emergency Medicine

## 2022-02-06 DIAGNOSIS — R112 Nausea with vomiting, unspecified: Secondary | ICD-10-CM | POA: Diagnosis not present

## 2022-02-06 DIAGNOSIS — R0981 Nasal congestion: Secondary | ICD-10-CM | POA: Diagnosis present

## 2022-02-06 DIAGNOSIS — Z20822 Contact with and (suspected) exposure to covid-19: Secondary | ICD-10-CM | POA: Diagnosis not present

## 2022-02-06 DIAGNOSIS — R197 Diarrhea, unspecified: Secondary | ICD-10-CM | POA: Diagnosis not present

## 2022-02-06 DIAGNOSIS — B349 Viral infection, unspecified: Secondary | ICD-10-CM | POA: Insufficient documentation

## 2022-02-06 MED ORDER — ONDANSETRON 4 MG PO TBDP: 4.0000 mg | ORAL_TABLET | Freq: Three times a day (TID) | ORAL | 0 refills | Status: DC | PRN

## 2022-02-06 MED ORDER — ONDANSETRON 4 MG PO TBDP
4.0000 mg | ORAL_TABLET | Freq: Once | ORAL | Status: AC
  Administered 2022-02-06: 4 mg via ORAL

## 2022-02-06 NOTE — ED Provider Notes (Signed)
[name]    CSN: [id number] Arrival date & time: 02/06/22  1717      History   Chief Complaint Chief Complaint  Patient presents with   Emesis   Nasal Congestion    HPI Kelli Soto is a 26 y.o. female.  Patient presents with 4 day history of nasal congestion.  She reports headache, chills, and generalized weakness x 2 days.  Two episodes of emesis today; 2 episodes of diarrhea today.  Treatment at home with Advil sinus; none taken today.  She denies fever, rash, sore throat, shortness of breath, abdominal pain, or other symptoms.  Negative at home COVID test x 2. She denies current pregnancy or breastfeeding.     The history is provided by the patient and medical records.    Past Medical History:  Diagnosis Date   Anemia    Bronchitis    Chlamydia 02/19/2017   Gonorrhea 2019   Neutropenia [facility])     Patient Active Problem List   Diagnosis Date Noted   Migraine without aura and without status migrainosus, not intractable 12/24/2019   Low iron 02/10/2019   Low serum vitamin D 12/29/2018   Neutropenia (HCC) 12/29/2018    Past Surgical History:  Procedure Laterality Date   CESAREAN SECTION MULTI-GESTATIONAL N/A 06/10/2019   Procedure: CESAREAN SECTION MULTI-GESTATIONAL;  Surgeon: Catalina Antigua, MD;  Location: [facility] LD ORS;  Service: Obstetrics;  Laterality: N/A;    OB History     Gravida  2   Para  2   Term  1   Preterm  1   AB      Living  3      SAB      IAB      Ectopic      Multiple  1   Live Births  3            Home Medications    Prior to Admission medications   Medication Sig Start Date End Date Taking? Authorizing Provider  ondansetron (ZOFRAN-ODT) 4 MG disintegrating tablet Take 1 tablet (4 mg total) by mouth every 8 (eight) hours as needed for nausea or vomiting. 02/06/22  Yes Mickie Bail, NP  hydrOXYzine (ATARAX) 25 MG tablet Take 1 tablet (25 mg total) by mouth every 6 (six) hours. 11/22/21   Becky Augusta, NP   tinidazole (TINDAMAX) 500 MG tablet Take 4 tablets (2,000 mg total) by mouth daily with breakfast. For two days 01/16/21   Anyanwu, Jethro Bastos, MD  [employer] 150-35 MCG/24HR transdermal patch PLACE 1 PATCH ONTO THE SKIN ONCE A WEEK FOR 3 WEEKS LEAVE OFF FOR THE 4TH TO HAVE YOUR PERIOD 01/09/21   Pateros Bing, MD    Family History Family History  Problem Relation Age of Onset   Hypertension Mother    Healthy Father    Diabetes Other     Social History Social History   Tobacco Use   Smoking status: Never   Smokeless tobacco: Never  Vaping Use   Vaping Use: Never used  Substance Use Topics   Alcohol use: No   Drug use: Not Currently    Types: Marijuana    Comment: last smoked 2 weeks from August 28th     Allergies   Patient has no known allergies.   Review of Systems Review of Systems  Constitutional:  Positive for chills and fatigue. Negative for fever.  HENT:  Negative for ear pain and sore throat.   Respiratory:  Negative for cough  and shortness of breath.   Cardiovascular:  Negative for chest pain and palpitations.  Gastrointestinal:  Positive for diarrhea, nausea and vomiting. Negative for abdominal pain.  Skin:  Negative for color change and rash.  Neurological:  Positive for headaches.  All other systems reviewed and are negative.    Physical Exam Triage Vital Signs ED Triage Vitals  Enc Vitals Group     BP      Pulse      Resp      Temp      Temp src      SpO2      Weight      Height      Head Circumference      Peak Flow      Pain Score      Pain Loc      Pain Edu?      Excl. in GC?    No data found.  Updated Vital Signs BP 103/63   Pulse 76   Temp 98.8 F (37.1 C)   Resp 18   Ht 5\' 4"  (1.626 m)   Wt 155 lb (70.3 kg)   LMP 01/20/2022   SpO2 99%   BMI 26.61 kg/m   Visual Acuity Right Eye Distance:   Left Eye Distance:   Bilateral Distance:    Right Eye Near:   Left Eye Near:    Bilateral Near:     Physical Exam Vitals and  nursing note reviewed.  Constitutional:      General: She is not in acute distress.    Appearance: Normal appearance. She is well-developed. She is ill-appearing.  HENT:     Right Ear: Tympanic membrane normal.     Left Ear: Tympanic membrane normal.     Nose: Nose normal.     Mouth/Throat:     Mouth: Mucous membranes are moist.     Pharynx: Oropharynx is clear.  Cardiovascular:     Rate and Rhythm: Normal rate and regular rhythm.     Heart sounds: Normal heart sounds.  Pulmonary:     Effort: Pulmonary effort is normal. No respiratory distress.     Breath sounds: Normal breath sounds.  Abdominal:     General: Bowel sounds are normal.     Palpations: Abdomen is soft.     Tenderness: There is no abdominal tenderness. There is no guarding or rebound.  Musculoskeletal:     Cervical back: Neck supple.  Skin:    General: Skin is warm and dry.  Neurological:     Mental Status: She is alert.  Psychiatric:        Mood and Affect: Mood normal.        Behavior: Behavior normal.      UC Treatments / Results  Labs (all labs ordered are listed, but only abnormal results are displayed) Labs Reviewed  COVID-19, FLU A+B NAA  SARS CORONAVIRUS 2 (TAT 6-24 HRS)    EKG   Radiology No results found.  Procedures Procedures (including critical care time)  Medications Ordered in UC Medications  ondansetron (ZOFRAN-ODT) disintegrating tablet 4 mg (4 mg Oral Given 02/06/22 1810)    Initial Impression / Assessment and Plan / UC Course  I have reviewed the triage vital signs and the nursing notes.  Pertinent labs & imaging results that were available during my care of the patient were reviewed by me and considered in my medical decision making (see chart for details).    Viral illness, nausea, vomiting, diarrhea.  Zofran given here.  COVID pending.  Treating nausea and vomiting with Zofran.  Discussed clear liquid diet.  Instructed patient to advance to diarrhea diet as tolerated.   Discussed maintaining oral hydration at home; ED precautions discussed.  Education provided on nausea and vomiting, diarrhea, viral illness.  Instructed patient to follow-up with her PCP as needed.  She agrees to plan of care.   Final Clinical Impressions(s) / UC Diagnoses   Final diagnoses:  Viral illness  Nausea vomiting and diarrhea     Discharge Instructions      Take the antinausea medication as directed.    Keep yourself hydrated with clear liquids, such as water and Gatorade.  Follow diarrhea diet as tolerated.  Go to the emergency department if you have acute worsening symptoms.    Follow up with your primary care provider if your symptoms are not improving.          ED Prescriptions     Medication Sig Dispense Auth. Provider   ondansetron (ZOFRAN-ODT) 4 MG disintegrating tablet Take 1 tablet (4 mg total) by mouth every 8 (eight) hours as needed for nausea or vomiting. 20 tablet Mickie Bail, NP      PDMP not reviewed this encounter.   Mickie Bail, NP 02/06/22 1816

## 2022-02-06 NOTE — Discharge Instructions (Addendum)
Take the antinausea medication as directed.    Keep yourself hydrated with clear liquids, such as water and Gatorade.  Follow diarrhea diet as tolerated.  Go to the emergency department if you have acute worsening symptoms.    Follow up with your primary care provider if your symptoms are not improving.

## 2022-02-06 NOTE — ED Triage Notes (Signed)
Patient to Urgent Care with complaints of sinus congestion, emesis, and generalized weakness. Symptoms started on Friday. Poor appetite.   Has been taking sinus advil which has helped with the pain. Has been having chills and hot flashes. Denies any fevers. Reports x2 negative home covid tests.

## 2022-02-07 LAB — SARS CORONAVIRUS 2 (TAT 6-24 HRS): SARS Coronavirus 2: NEGATIVE

## 2022-02-12 ENCOUNTER — Other Ambulatory Visit: Payer: Self-pay | Admitting: *Deleted

## 2022-02-12 MED ORDER — XULANE 150-35 MCG/24HR TD PTWK: MEDICATED_PATCH | TRANSDERMAL | 0 refills | Status: DC

## 2022-04-02 ENCOUNTER — Other Ambulatory Visit (HOSPITAL_COMMUNITY)
Admission: RE | Admit: 2022-04-02 | Discharge: 2022-04-02 | Disposition: A | Discharge status: Home / Self Care | Payer: Medicaid Other | Source: Ambulatory Visit | Attending: Family Medicine | Admitting: Family Medicine

## 2022-04-02 ENCOUNTER — Ambulatory Visit (INDEPENDENT_AMBULATORY_CARE_PROVIDER_SITE_OTHER): Payer: Medicaid Other | Admitting: Family Medicine

## 2022-04-02 ENCOUNTER — Encounter: Payer: Self-pay | Admitting: Family Medicine

## 2022-04-02 VITALS — BP 107/67 | HR 64 | Ht 64.0 in | Wt 149.0 lb

## 2022-04-02 DIAGNOSIS — Z124 Encounter for screening for malignant neoplasm of cervix: Secondary | ICD-10-CM | POA: Insufficient documentation

## 2022-04-02 DIAGNOSIS — Z01419 Encounter for gynecological examination (general) (routine) without abnormal findings: Secondary | ICD-10-CM | POA: Insufficient documentation

## 2022-04-02 DIAGNOSIS — Z3045 Encounter for surveillance of transdermal patch hormonal contraceptive device: Secondary | ICD-10-CM | POA: Diagnosis not present

## 2022-04-02 DIAGNOSIS — Z113 Encounter for screening for infections with a predominantly sexual mode of transmission: Secondary | ICD-10-CM | POA: Diagnosis not present

## 2022-04-02 MED ORDER — XULANE 150-35 MCG/24HR TD PTWK: MEDICATED_PATCH | TRANSDERMAL | 4 refills | Status: DC

## 2022-04-02 NOTE — Progress Notes (Signed)
GYNECOLOGY ANNUAL PREVENTATIVE CARE ENCOUNTER NOTE  Subjective:   Kelli Soto is a 26 y.o. G73P1103 female here for a routine annual gynecologic exam.  Current complaints: none, here for annual. Likes the contraceptive patch. Not currently planning pregnancy. Has 26 yo twins. Recently broke her foot and is in a boot.   Denies abnormal vaginal bleeding, discharge, pelvic pain, problems with intercourse or other gynecologic concerns.    Gynecologic History Patient's last menstrual period was 03/20/2022. Contraception: Ortho-Evra patches weekly Last Pap: 2020. Results were: normal Last mammogram: NA.   Health Maintenance Due  Topic Date Due   COVID-19 Vaccine (1) Never done   HPV VACCINES (1 - 2-dose series) Never done   TETANUS/TDAP  Never done   PAP-Cervical Cytology Screening  12/10/2021   PAP SMEAR-Modifier  12/10/2021   INFLUENZA VACCINE  Never done    The following portions of the patient's history were reviewed and updated as appropriate: allergies, current medications, past family history, past medical history, past social history, past surgical history and problem list.  Review of Systems Pertinent items are noted in HPI.   Objective:  BP 107/67   Pulse 64   Ht 5\' 4"  (1.626 m)   Wt 149 lb (67.6 kg)   LMP 03/20/2022   BMI 25.58 kg/m  CONSTITUTIONAL: Well-developed, well-nourished female in no acute distress.  HENT:  Normocephalic, atraumatic, External right and left ear normal. Oropharynx is clear and moist EYES:  No scleral icterus.  NECK: Normal range of motion, supple, no masses.  Normal thyroid.  SKIN: Skin is warm and dry. No rash noted. Not diaphoretic. No erythema. No pallor. NEUROLOGIC: Alert and oriented to person, place, and time. Normal reflexes, muscle tone coordination. No cranial nerve deficit noted. PSYCHIATRIC: Normal mood and affect. Normal behavior. Normal judgment and thought content. CARDIOVASCULAR: Normal heart rate noted, regular rhythm. 2+  distal pulses. RESPIRATORY: Effort and breath sounds normal, no problems with respiration noted. BREASTS: Symmetric in size. No masses, skin changes, nipple drainage, or lymphadenopathy. ABDOMEN: Soft,  no distention noted.  No tenderness, rebound or guarding.  PELVIC: Normal appearing external genitalia; normal appearing vaginal mucosa and cervix.  No abnormal discharge noted.  Pap smear obtained.  Normal uterine size, no other palpable masses, no uterine or adnexal tenderness. MUSCULOSKELETAL: Normal range of motion.    Assessment and Plan:  1) Annual gynecologic examination with pap smear:  Will follow up results of pap smear and manage accordingly. STI screen also ordered today.  Routine preventative health maintenance measures emphasized.  2) Contraception counseling: Reviewed all forms of birth control options available including abstinence; over the counter/barrier methods; hormonal contraceptive medication including pill, patch, ring, injection,contraceptive implant; hormonal and nonhormonal IUDs; permanent sterilization options including vasectomy and the various tubal sterilization modalities. Risks and benefits reviewed.  Questions were answered.  Written information was also given to the patient to review.  Patient desires to continued the patch, this was prescribed for patient. She will follow up in  64yr for surveillance.  She was told to call with any further questions, or with any concerns about this method of contraception.  Emphasized use of condoms 100% of the time for STI prevention.  1. Well woman exam with routine gynecological exam CBE WNL Pap collected - Cytology - PAP - norelgestromin-ethinyl estradiol (XULANE) 150-35 MCG/24HR transdermal patch; PLACE 1 PATCH ONTO THE SKIN ONCE A WEEK FOR 3 WEEKS LEAVE OFF FOR THE 4TH TO HAVE YOUR PERIOD  Dispense: 9 patch; Refill: 4  2. Cervical cancer screening - Cytology - PAP  3. Encounter for surveillance of transdermal patch  hormonal contraceptive device 1 year supply of patches   Please refer to After Visit Summary for other counseling recommendations.   No follow-ups on file.  Caren Macadam, MD, MPH, ABFM Attending Physician Center for [facility]

## 2022-04-02 NOTE — Progress Notes (Signed)
Annual   LMP:03/20/22 approx Last Pap:12/11/2018 ASCUS Neg HPV Contraception: Patch  STD Screening: declines   CC: None   Currently taking treatment for BV.

## 2022-04-03 LAB — HEPATITIS B SURFACE ANTIGEN: Hepatitis B Surface Ag: NEGATIVE

## 2022-04-03 LAB — HEPATITIS C ANTIBODY: Hep C Virus Ab: NONREACTIVE

## 2022-04-03 LAB — HIV ANTIBODY (ROUTINE TESTING W REFLEX): HIV Screen 4th Generation wRfx: NONREACTIVE

## 2022-04-03 LAB — RPR: RPR Ser Ql: NONREACTIVE

## 2022-04-04 LAB — CYTOLOGY - PAP
Chlamydia: NEGATIVE
Comment: NEGATIVE
Comment: NEGATIVE
Comment: NEGATIVE
Comment: NORMAL
Diagnosis: UNDETERMINED — AB
High risk HPV: NEGATIVE
Neisseria Gonorrhea: NEGATIVE
Trichomonas: NEGATIVE

## 2022-04-10 ENCOUNTER — Telehealth: Payer: Self-pay | Admitting: *Deleted

## 2022-04-10 NOTE — Telephone Encounter (Signed)
-----   Message from Caren Macadam, MD sent at 04/09/2022 12:09 PM EDT ----- ASCUS, HPV negative. Repeat in 3 years

## 2022-04-10 NOTE — Telephone Encounter (Signed)
Left message on voicemail, labs are normal and she doesn't need a pap for 3 years and to call back if she has any questions.

## 2022-04-23 ENCOUNTER — Ambulatory Visit
Admission: EM | Admit: 2022-04-23 | Discharge: 2022-04-23 | Disposition: A | Discharge status: Home / Self Care | Payer: Medicaid Other | Attending: Urgent Care | Admitting: Urgent Care

## 2022-04-23 DIAGNOSIS — Z1152 Encounter for screening for COVID-19: Secondary | ICD-10-CM | POA: Insufficient documentation

## 2022-04-23 DIAGNOSIS — R051 Acute cough: Secondary | ICD-10-CM | POA: Diagnosis present

## 2022-04-23 DIAGNOSIS — R6889 Other general symptoms and signs: Secondary | ICD-10-CM | POA: Insufficient documentation

## 2022-04-23 LAB — RESP PANEL BY RT-PCR (RSV, FLU A&B, COVID)  RVPGX2
Influenza A by PCR: NEGATIVE
Influenza B by PCR: NEGATIVE
Resp Syncytial Virus by PCR: NEGATIVE
SARS Coronavirus 2 by RT PCR: NEGATIVE

## 2022-04-23 NOTE — ED Provider Notes (Signed)
Roderic Palau    CSN: [id number] Arrival date & time: 04/23/22  7482      History   Chief Complaint Chief Complaint  Patient presents with   Abdominal Pain   Cough   Nasal Congestion   Emesis   Generalized Body Aches    HPI Kelli Soto is a 26 y.o. female.    Abdominal Pain Associated symptoms: cough and vomiting   Cough Emesis Associated symptoms: abdominal pain and cough     This to UC with complaint of flulike symptoms starting yesterday.  She endorses abdominal pain, nausea, vomiting, body aches, cough, congestion.  She states she finds it difficult to hold food down but has been able to hold water down.  States she vomits everything up after eating.  Denies documented fever but endorses tactile fever and chills.  She presents with her child who has been "acting fussy" recently.  Past Medical History:  Diagnosis Date   Anemia    Bronchitis    Chlamydia 02/19/2017   Gonorrhea 2019   Neutropenia [facility])     Patient Active Problem List   Diagnosis Date Noted   Migraine without aura and without status migrainosus, not intractable 12/24/2019   Low iron 02/10/2019   Low serum vitamin D 12/29/2018   Neutropenia ([place]) 12/29/2018    Past Surgical History:  Procedure Laterality Date   CESAREAN SECTION MULTI-GESTATIONAL N/A 06/10/2019   Procedure: CESAREAN SECTION MULTI-GESTATIONAL;  Surgeon: Mora Bellman, MD;  Location: [facility] LD ORS;  Service: Obstetrics;  Laterality: N/A;    OB History     Gravida  2   Para  2   Term  1   Preterm  1   AB      Living  3      SAB      IAB      Ectopic      Multiple  1   Live Births  3            Home Medications    Prior to Admission medications   Medication Sig Start Date End Date Taking? Authorizing Provider  diclofenac (VOLTAREN) 75 MG EC tablet Take by oral route for 30 days.    [provider]  fluconazole (DIFLUCAN) 150 MG tablet TAKE 1 TABLET (150 MG TOTAL) BY MOUTH ONCE  FOR 1 DOSE. MAY REPEAT 3 DAYS LATER IF SYMPTOMS PERSIST    [provider]  hydrOXYzine (ATARAX) 25 MG tablet Take 1 tablet (25 mg total) by mouth every 6 (six) hours. 11/22/21   Margarette Canada, NP  meloxicam (MOBIC) 15 MG tablet TAKE 1 TABLET BY MOUTH EVERY DAY WITH MEALS    [provider]  methocarbamol (ROBAXIN) 500 MG tablet Take 1 tablet by mouth as needed.    [provider]  metroNIDAZOLE (METROGEL) 0.75 % vaginal gel PLACE 1 APPLICATORFUL VAGINALLY AT BEDTIME FOR 5 DAYS.    [provider]  naproxen (NAPROSYN) 500 MG tablet Take 1 tablet twice a day by oral route. 04/02/22   [provider]  norelgestromin-ethinyl estradiol Marilu Favre) 150-35 MCG/24HR transdermal patch PLACE 1 PATCH ONTO THE SKIN ONCE A WEEK FOR 3 WEEKS LEAVE OFF FOR THE 4TH TO HAVE YOUR PERIOD 04/02/22   Caren Macadam, MD  ondansetron (ZOFRAN-ODT) 4 MG disintegrating tablet Take 1 tablet (4 mg total) by mouth every 8 (eight) hours as needed for nausea or vomiting. 02/06/22   Sharion Balloon, NP  tinidazole (TINDAMAX) 500 MG tablet Take  4 tablets (2,000 mg total) by mouth daily with breakfast. For two days Patient not taking: Reported on 04/02/2022 01/16/21   Anyanwu, Jethro Bastos, MD  traMADol (ULTRAM) 50 MG tablet Take 50 mg by mouth every 6 (six) hours. 03/23/22   [provider]    Family History Family History  Problem Relation Age of Onset   Hypertension Mother    Healthy Father    Diabetes Other     Social History Social History   Tobacco Use   Smoking status: Never   Smokeless tobacco: Never  Vaping Use   Vaping Use: Never used  Substance Use Topics   Alcohol use: No   Drug use: Not Currently    Types: Marijuana    Comment: last smoked 2 weeks from August 28th     Allergies   Patient has no known allergies.   Review of Systems Review of Systems  Respiratory:  Positive for cough.   Gastrointestinal:  Positive for abdominal pain and vomiting.      Physical Exam Triage Vital Signs ED Triage Vitals [04/23/22 0856]  Enc Vitals Group     BP 113/66     Pulse Rate 75     Resp 18     Temp 97.9 F (36.6 C)     Temp src      SpO2 97 %     Weight      Height      Head Circumference      Peak Flow      Pain Score 8     Pain Loc      Pain Edu?      Excl. in GC?    No data found.  Updated Vital Signs BP 113/66   Pulse 75   Temp 97.9 F (36.6 C)   Resp 18   LMP  (LMP Unknown)   SpO2 97%   Breastfeeding Unknown   Visual Acuity Right Eye Distance:   Left Eye Distance:   Bilateral Distance:    Right Eye Near:   Left Eye Near:    Bilateral Near:     Physical Exam Vitals reviewed.  Constitutional:      Appearance: She is well-developed.  HENT:     Mouth/Throat:     Pharynx: No oropharyngeal exudate or posterior oropharyngeal erythema.     Tonsils: No tonsillar exudate. 1+ on the right. 1+ on the left.  Cardiovascular:     Rate and Rhythm: Normal rate and regular rhythm.     Heart sounds: Normal heart sounds.  Pulmonary:     Effort: Pulmonary effort is normal.     Breath sounds: Normal breath sounds.  Skin:    General: Skin is warm and dry.  Neurological:     General: No focal deficit present.     Mental Status: She is alert and oriented to person, place, and time.  Psychiatric:        Mood and Affect: Mood normal.        Behavior: Behavior normal.      UC Treatments / Results  Labs (all labs ordered are listed, but only abnormal results are displayed) Labs Reviewed  RESP PANEL BY RT-PCR (RSV, FLU A&B, COVID)  [id number]    EKG   Radiology No results found.  Procedures Procedures (including critical care time)  Medications Ordered in UC Medications - No data to display  Initial Impression / Assessment and Plan / UC Course  I have reviewed the triage vital signs  and the nursing notes.  Pertinent labs & imaging results that were available during my care of the patient were reviewed by me  and considered in my medical decision making (see chart for details).   Likely viral process including influenza.  Respiratory swab is obtained and pending.  Encouraged patient to get bedrest as possible while caring for her child.  Focus on hydration rather than food.  Advance diet as tolerated.  She may use OTC medication for symptom control.   Final Clinical Impressions(s) / UC Diagnoses   Final diagnoses:  Acute cough   Discharge Instructions   None    ED Prescriptions   None    PDMP not reviewed this encounter.   Charma Igo, [place] 04/23/22 [phone]

## 2022-04-23 NOTE — Discharge Instructions (Signed)
Follow up here or with your primary care provider if your symptoms are worsening or not improving.     

## 2022-04-23 NOTE — ED Triage Notes (Signed)
Pt. Presents to UC w/ c/o abdominal pain nausea, emesis, body aches, a cough and congestion that started yesterday.Pt. expresses concern for COVID/FLU.

## 2022-05-17 ENCOUNTER — Other Ambulatory Visit: Payer: Self-pay | Admitting: *Deleted

## 2022-05-17 MED ORDER — METRONIDAZOLE 0.75 % VA GEL: VAGINAL | 1 refills | Status: DC

## 2022-05-28 ENCOUNTER — Ambulatory Visit
Admission: EM | Admit: 2022-05-28 | Discharge: 2022-05-28 | Disposition: A | Discharge status: Home / Self Care | Payer: Medicaid Other | Attending: Emergency Medicine | Admitting: Emergency Medicine

## 2022-05-28 DIAGNOSIS — R52 Pain, unspecified: Secondary | ICD-10-CM | POA: Diagnosis present

## 2022-05-28 DIAGNOSIS — B349 Viral infection, unspecified: Secondary | ICD-10-CM | POA: Insufficient documentation

## 2022-05-28 DIAGNOSIS — Z1152 Encounter for screening for COVID-19: Secondary | ICD-10-CM | POA: Insufficient documentation

## 2022-05-28 DIAGNOSIS — R110 Nausea: Secondary | ICD-10-CM | POA: Diagnosis not present

## 2022-05-28 DIAGNOSIS — B9789 Other viral agents as the cause of diseases classified elsewhere: Secondary | ICD-10-CM | POA: Insufficient documentation

## 2022-05-28 DIAGNOSIS — Z79899 Other long term (current) drug therapy: Secondary | ICD-10-CM | POA: Diagnosis not present

## 2022-05-28 HISTORY — DX: Pain, unspecified: R52

## 2022-05-28 LAB — RESP PANEL BY RT-PCR (FLU A&B, COVID) ARPGX2
Influenza A by PCR: NEGATIVE
Influenza B by PCR: NEGATIVE
SARS Coronavirus 2 by RT PCR: NEGATIVE

## 2022-05-28 MED ORDER — ONDANSETRON 4 MG PO TBDP: 4.0000 mg | ORAL_TABLET | Freq: Three times a day (TID) | ORAL | 0 refills | Status: DC | PRN

## 2022-05-28 NOTE — ED Provider Notes (Signed)
Kelli Soto    CSN: [id number] Arrival date & time: 05/28/22  0855      History   Chief Complaint Chief Complaint  Patient presents with   Cough   Generalized Body Aches    HPI Kelli Soto is a 26 y.o. female.  Patient presents with 3-day history of chills, body aches, headache, congestion, cough, nausea, diarrhea.  She denies fever, shortness of breath, vomiting, rash, or other symptoms.  She took Dimetapp yesterday evening but no OTC medications taken today.  Her medical history includes migraine headaches.  She denies current pregnancy or breastfeeding.     The history is provided by the patient and medical records.    Past Medical History:  Diagnosis Date   Anemia    Bronchitis    Chlamydia 02/19/2017   Gonorrhea 2019   Neutropenia [facility])     Patient Active Problem List   Diagnosis Date Noted   Generalized body aches 05/28/2022   Migraine without aura and without status migrainosus, not intractable 12/24/2019   Low iron 02/10/2019   Low serum vitamin D 12/29/2018   Neutropenia (HCC) 12/29/2018    Past Surgical History:  Procedure Laterality Date   CESAREAN SECTION MULTI-GESTATIONAL N/A 06/10/2019   Procedure: CESAREAN SECTION MULTI-GESTATIONAL;  Surgeon: Catalina Antigua, MD;  Location: [facility] LD ORS;  Service: Obstetrics;  Laterality: N/A;    OB History     Gravida  2   Para  2   Term  1   Preterm  1   AB      Living  3      SAB      IAB      Ectopic      Multiple  1   Live Births  3            Home Medications    Prior to Admission medications   Medication Sig Start Date End Date Taking? Authorizing Provider  ondansetron (ZOFRAN-ODT) 4 MG disintegrating tablet Take 1 tablet (4 mg total) by mouth every 8 (eight) hours as needed for nausea or vomiting. 05/28/22  Yes Mickie Bail, NP  diclofenac (VOLTAREN) 75 MG EC tablet Take by oral route for 30 days.    [provider]  fluconazole (DIFLUCAN) 150 MG tablet  TAKE 1 TABLET (150 MG TOTAL) BY MOUTH ONCE FOR 1 DOSE. MAY REPEAT 3 DAYS LATER IF SYMPTOMS PERSIST    [provider]  hydrOXYzine (ATARAX) 25 MG tablet Take 1 tablet (25 mg total) by mouth every 6 (six) hours. 11/22/21   Becky Augusta, NP  meloxicam (MOBIC) 15 MG tablet TAKE 1 TABLET BY MOUTH EVERY DAY WITH MEALS    [provider]  methocarbamol (ROBAXIN) 500 MG tablet Take 1 tablet by mouth as needed.    [provider]  metroNIDAZOLE (METROGEL) 0.75 % vaginal gel PLACE 1 APPLICATORFUL VAGINALLY AT BEDTIME FOR 5 DAYS. 05/17/22   Anyanwu, Jethro Bastos, MD  naproxen (NAPROSYN) 500 MG tablet Take 1 tablet twice a day by oral route. 04/02/22   [provider]  norelgestromin-ethinyl estradiol [facility]) 150-35 MCG/24HR transdermal patch PLACE 1 PATCH ONTO THE SKIN ONCE A WEEK FOR 3 WEEKS LEAVE OFF FOR THE 4TH TO HAVE YOUR PERIOD 04/02/22   Federico Flake, MD  tinidazole (TINDAMAX) 500 MG tablet Take 4 tablets (2,000 mg total) by mouth daily with breakfast. For two days Patient not taking: Reported on 04/02/2022 01/16/21   Anyanwu, Jethro Bastos, MD  traMADol Jane[facility]ark)  50 MG tablet Take 50 mg by mouth every 6 (six) hours. 03/23/22   [provider]    Family History Family History  Problem Relation Age of Onset   Hypertension Mother    Healthy Father    Diabetes Other     Social History Social History   Tobacco Use   Smoking status: Never   Smokeless tobacco: Never  Vaping Use   Vaping Use: Never used  Substance Use Topics   Alcohol use: No   Drug use: Not Currently    Types: Marijuana    Comment: last smoked 2 weeks from August 28th     Allergies   Patient has no known allergies.   Review of Systems Review of Systems  Constitutional:  Positive for fatigue. Negative for chills and fever.  HENT:  Negative for ear pain and sore throat.   Respiratory:  Positive for cough. Negative for shortness of breath.   Cardiovascular:  Negative for chest  pain and palpitations.  Gastrointestinal:  Positive for diarrhea and nausea. Negative for abdominal pain and vomiting.  Skin:  Negative for color change and rash.  Neurological:  Positive for headaches.  All other systems reviewed and are negative.    Physical Exam Triage Vital Signs ED Triage Vitals  Enc Vitals Group     BP 05/28/22 0942 110/77     Pulse Rate 05/28/22 0942 62     Resp 05/28/22 0942 18     Temp 05/28/22 0942 98.1 F (36.7 C)     Temp Source 05/28/22 0942 Oral     SpO2 05/28/22 0942 98 %     Weight --      Height 05/28/22 0939 5\' 4"  (1.626 m)     Head Circumference --      Peak Flow --      Pain Score 05/28/22 0939 7     Pain Loc --      Pain Edu? --      Excl. in GC? --    No data found.  Updated Vital Signs BP 110/77   Pulse 62   Temp 98.1 F (36.7 C) (Oral)   Resp 18   Ht 5\' 4"  (1.626 m)   LMP 05/09/2022   SpO2 98%   BMI 25.58 kg/m   Visual Acuity Right Eye Distance:   Left Eye Distance:   Bilateral Distance:    Right Eye Near:   Left Eye Near:    Bilateral Near:     Physical Exam Vitals and nursing note reviewed.  Constitutional:      General: She is not in acute distress.    Appearance: Normal appearance. She is well-developed. She is ill-appearing.  HENT:     Right Ear: Tympanic membrane normal.     Left Ear: Tympanic membrane normal.     Nose: Rhinorrhea present.     Mouth/Throat:     Mouth: Mucous membranes are moist.     Pharynx: Oropharynx is clear.  Cardiovascular:     Rate and Rhythm: Normal rate and regular rhythm.     Heart sounds: Normal heart sounds.  Pulmonary:     Effort: Pulmonary effort is normal. No respiratory distress.     Breath sounds: Normal breath sounds.  Abdominal:     General: Bowel sounds are normal.     Palpations: Abdomen is soft.     Tenderness: There is no abdominal tenderness. There is no guarding or rebound.  Musculoskeletal:     Cervical back: Neck  supple.  Skin:    General: Skin is warm  and dry.  Neurological:     Mental Status: She is alert.  Psychiatric:        Mood and Affect: Mood normal.        Behavior: Behavior normal.      UC Treatments / Results  Labs (all labs ordered are listed, but only abnormal results are displayed) Labs Reviewed  RESP PANEL BY RT-PCR (FLU A&B, COVID) [id number]    EKG   Radiology No results found.  Procedures Procedures (including critical care time)  Medications Ordered in UC Medications - No data to display  Initial Impression / Assessment and Plan / UC Course  I have reviewed the triage vital signs and the nursing notes.  Pertinent labs & imaging results that were available during my care of the patient were reviewed by me and considered in my medical decision making (see chart for details).    Body aches, nausea without vomiting, viral illness.  Treating nausea with Zofran.  COVID and Flu pending.  Discussed symptomatic treatment including Tylenol or ibuprofen, rest, hydration.  Instructed patient to follow up with her PCP if symptoms are not improving.  She agrees to plan of care.   Final Clinical Impressions(s) / UC Diagnoses   Final diagnoses:  Generalized body aches  Nausea without vomiting  Viral illness     Discharge Instructions      Your COVID and Flu tests are pending.    Take Tylenol or ibuprofen as needed for fever or discomfort.  Take the Zofran as needed for nausea.  Rest and keep yourself hydrated.    Follow-up with your primary care provider if your symptoms are not improving.         ED Prescriptions     Medication Sig Dispense Auth. Provider   ondansetron (ZOFRAN-ODT) 4 MG disintegrating tablet Take 1 tablet (4 mg total) by mouth every 8 (eight) hours as needed for nausea or vomiting. 20 tablet Mickie Bail, NP      PDMP not reviewed this encounter.   Mickie Bail, NP 05/28/22 1023

## 2022-05-28 NOTE — Discharge Instructions (Addendum)
Your COVID and Flu tests are pending.    Take Tylenol or ibuprofen as needed for fever or discomfort.  Take the Zofran as needed for nausea.  Rest and keep yourself hydrated.    Follow-up with your primary care provider if your symptoms are not improving.

## 2022-05-28 NOTE — ED Triage Notes (Signed)
Patient to Urgent Care with complaints of nasal congestion/ drainage, cough, chills,and headaches/ generalized body aches. Symptoms started on Friday.  Reports coughing so much she is nauseated. Reports some diarrhea.   Denies any fevers but does report chills/ hot flashes.

## 2022-09-26 ENCOUNTER — Emergency Department: Payer: Medicaid Other

## 2022-09-26 ENCOUNTER — Emergency Department
Admission: EM | Admit: 2022-09-26 | Discharge: 2022-09-26 | Disposition: A | Discharge status: Home / Self Care | Payer: Medicaid Other | Attending: Emergency Medicine | Admitting: Emergency Medicine

## 2022-09-26 ENCOUNTER — Other Ambulatory Visit: Payer: Self-pay

## 2022-09-26 DIAGNOSIS — S0990XA Unspecified injury of head, initial encounter: Secondary | ICD-10-CM | POA: Diagnosis present

## 2022-09-26 MED ORDER — IBUPROFEN 400 MG PO TABS
400.0000 mg | ORAL_TABLET | Freq: Once | ORAL | Status: AC
  Administered 2022-09-26: 400 mg via ORAL
  Filled 2022-09-26: qty 1
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
## 2022-09-27 ENCOUNTER — Emergency Department
Admission: EM | Admit: 2022-09-27 | Discharge: 2022-09-27 | Disposition: A | Discharge status: Home / Self Care | Payer: Medicaid Other | Attending: Emergency Medicine | Admitting: Emergency Medicine

## 2022-09-27 ENCOUNTER — Other Ambulatory Visit: Payer: Self-pay

## 2022-09-27 DIAGNOSIS — R110 Nausea: Secondary | ICD-10-CM | POA: Diagnosis not present

## 2022-09-27 DIAGNOSIS — R519 Headache, unspecified: Secondary | ICD-10-CM | POA: Insufficient documentation

## 2022-09-27 DIAGNOSIS — S0990XD Unspecified injury of head, subsequent encounter: Secondary | ICD-10-CM

## 2022-09-27 MED ORDER — KETOROLAC TROMETHAMINE 30 MG/ML IJ SOLN
15.0000 mg | Freq: Once | INTRAMUSCULAR | Status: DC
  Filled 2022-09-27: qty 1

## 2022-09-27 MED ORDER — ONDANSETRON 4 MG PO TBDP
4.0000 mg | ORAL_TABLET | Freq: Once | ORAL | Status: AC
  Administered 2022-09-27: 4 mg via ORAL
  Filled 2022-09-27: qty 1

## 2022-09-27 MED ORDER — SODIUM CHLORIDE 0.9 % IV BOLUS: 1000.0000 mL | Freq: Once | INTRAVENOUS | Status: DC

## 2022-09-27 MED ORDER — DIPHENHYDRAMINE HCL 50 MG/ML IJ SOLN
12.5000 mg | Freq: Once | INTRAMUSCULAR | Status: DC
  Filled 2022-09-27: qty 1

## 2022-09-27 MED ORDER — TRAMADOL HCL 50 MG PO TABS
50.0000 mg | ORAL_TABLET | Freq: Once | ORAL | Status: AC
  Administered 2022-09-27: 50 mg via ORAL
  Filled 2022-09-27: qty 1

## 2022-09-27 MED ORDER — TRAMADOL HCL 50 MG PO TABS: 50.0000 mg | ORAL_TABLET | Freq: Four times a day (QID) | ORAL | 0 refills | Status: DC | PRN

## 2022-09-27 MED ORDER — PROCHLORPERAZINE EDISYLATE 10 MG/2ML IJ SOLN
10.0000 mg | INTRAMUSCULAR | Status: DC
  Filled 2022-09-27: qty 2

## 2022-09-27 MED ORDER — ONDANSETRON 4 MG PO TBDP: 4.0000 mg | ORAL_TABLET | Freq: Three times a day (TID) | ORAL | 0 refills | Status: DC | PRN
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
## 2022-09-27 NOTE — ED Notes (Signed)
Pt is refusing IV, along with IV medicines and fluids
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
## 2022-10-19 ENCOUNTER — Other Ambulatory Visit: Payer: Self-pay

## 2022-10-19 DIAGNOSIS — N898 Other specified noninflammatory disorders of vagina: Secondary | ICD-10-CM

## 2022-10-19 MED ORDER — METRONIDAZOLE 0.75 % VA GEL: 1.0000 | Freq: Every day | VAGINAL | 0 refills | Status: DC

## 2022-10-19 NOTE — Progress Notes (Signed)
Rx sent per protocol for BV.       

## 2022-10-25 ENCOUNTER — Encounter: Payer: Self-pay | Admitting: Advanced Practice Midwife

## 2022-10-25 ENCOUNTER — Ambulatory Visit (INDEPENDENT_AMBULATORY_CARE_PROVIDER_SITE_OTHER): Payer: Medicaid Other | Admitting: Advanced Practice Midwife

## 2022-10-25 ENCOUNTER — Other Ambulatory Visit (HOSPITAL_COMMUNITY)
Admission: RE | Admit: 2022-10-25 | Discharge: 2022-10-25 | Disposition: A | Discharge status: Home / Self Care | Payer: Medicaid Other | Source: Ambulatory Visit | Attending: Advanced Practice Midwife | Admitting: Advanced Practice Midwife

## 2022-10-25 VITALS — BP 110/69 | HR 62 | Wt 155.0 lb

## 2022-10-25 DIAGNOSIS — Z3045 Encounter for surveillance of transdermal patch hormonal contraceptive device: Secondary | ICD-10-CM

## 2022-10-25 DIAGNOSIS — Z113 Encounter for screening for infections with a predominantly sexual mode of transmission: Secondary | ICD-10-CM | POA: Diagnosis present

## 2022-10-25 DIAGNOSIS — B9689 Other specified bacterial agents as the cause of diseases classified elsewhere: Secondary | ICD-10-CM

## 2022-10-25 DIAGNOSIS — N760 Acute vaginitis: Secondary | ICD-10-CM

## 2022-10-25 NOTE — Progress Notes (Signed)
CC: Wants to self swab   Needs Birth Control Refill

## 2022-10-27 ENCOUNTER — Encounter: Payer: Self-pay | Admitting: Advanced Practice Midwife

## 2022-10-27 NOTE — Progress Notes (Signed)
GYNECOLOGY PROBLEM CARE ENCOUNTER NOTE  History:     Kelli Soto is a 27 y.o. [id number] female here for a gynecologic  problem exam.  Current complaints: recurrent Bacterial Vaginosis, especially after her period. Resolves with prescription medication. Has also tried to self treat with vinegar baths. Uses Native brand hypoallergenic soap and that does not seem to be a trigger for her BV. Partner uses Old Spice soap and this may be a contributing factor. Denies abnormal vaginal bleeding, discharge, pelvic pain, problems with intercourse or other gynecologic concerns.    Gynecologic History Patient's last menstrual period was 09/16/2022. Contraception: Ortho-Evra patches weekly Last Pap: 03/2022. Result was ASCUS with negative HPV   Obstetric History OB History  Gravida Para Term Preterm AB Living  2 2 1 1   3   SAB IAB Ectopic Multiple Live Births        1 3    # Outcome Date GA Lbr Len/2nd Weight Sex Delivery Anes PTL Lv  2A Preterm 06/10/19 [redacted]w[redacted]d  2 lb 2.2 oz (0.97 kg) F CS-LTranv Spinal  LIV  2B Preterm 06/10/19 [redacted]w[redacted]d  2 lb 0.8 oz (0.93 kg) F CS-LTranv Spinal  LIV  1 Term 05/13/15 [redacted]w[redacted]d 10:55 / 00:27 5 lb 6.4 oz (2.45 kg) F Vag-Spont EPI  LIV     Birth Comments: none    Past Medical History:  Diagnosis Date   Anemia    Bronchitis    Chlamydia 02/19/2017   Gonorrhea 2019   Neutropenia (HCC)     Past Surgical History:  Procedure Laterality Date   CESAREAN SECTION MULTI-GESTATIONAL N/A 06/10/2019   Procedure: CESAREAN SECTION MULTI-GESTATIONAL;  Surgeon: Catalina Antigua, MD;  Location: [facility] LD ORS;  Service: Obstetrics;  Laterality: N/A;    Current Outpatient Medications on File Prior to Visit  Medication Sig Dispense Refill   norelgestromin-ethinyl estradiol (XULANE) 150-35 MCG/24HR transdermal patch PLACE 1 PATCH ONTO THE SKIN ONCE A WEEK FOR 3 WEEKS LEAVE OFF FOR THE 4TH TO HAVE YOUR PERIOD 9 patch 4   diclofenac (VOLTAREN) 75 MG EC tablet Take by oral route for 30  days. (Patient not taking: Reported on 10/25/2022)     fluconazole (DIFLUCAN) 150 MG tablet TAKE 1 TABLET (150 MG TOTAL) BY MOUTH ONCE FOR 1 DOSE. MAY REPEAT 3 DAYS LATER IF SYMPTOMS PERSIST (Patient not taking: Reported on 10/25/2022)     hydrOXYzine (ATARAX) 25 MG tablet Take 1 tablet (25 mg total) by mouth every 6 (six) hours. (Patient not taking: Reported on 10/25/2022) 30 tablet 0   meloxicam (MOBIC) 15 MG tablet TAKE 1 TABLET BY MOUTH EVERY DAY WITH MEALS (Patient not taking: Reported on 10/25/2022)     methocarbamol (ROBAXIN) 500 MG tablet Take 1 tablet by mouth as needed. (Patient not taking: Reported on 10/25/2022)     metroNIDAZOLE (METROGEL) 0.75 % vaginal gel PLACE 1 APPLICATORFUL VAGINALLY AT BEDTIME FOR 5 DAYS. (Patient not taking: Reported on 10/25/2022) 70 g 1   metroNIDAZOLE (METROGEL) 0.75 % vaginal gel Place 1 Applicatorful vaginally at bedtime. Apply one applicatorful to vagina at bedtime for 5 days (Patient not taking: Reported on 10/25/2022) 70 g 0   naproxen (NAPROSYN) 500 MG tablet Take 1 tablet twice a day by oral route. (Patient not taking: Reported on 10/25/2022)     ondansetron (ZOFRAN-ODT) 4 MG disintegrating tablet Take 1 tablet (4 mg total) by mouth every 8 (eight) hours as needed for nausea or vomiting. (Patient not taking: Reported on 10/25/2022) 20 tablet 0  tinidazole (TINDAMAX) 500 MG tablet Take 4 tablets (2,000 mg total) by mouth daily with breakfast. For two days (Patient not taking: Reported on 04/02/2022) 8 tablet 2   traMADol (ULTRAM) 50 MG tablet Take 1 tablet (50 mg total) by mouth every 6 (six) hours as needed. (Patient not taking: Reported on 10/25/2022) 12 tablet 0   No current facility-administered medications on file prior to visit.    No Known Allergies  Social History:  reports that she has never smoked. She has never used smokeless tobacco. She reports that she does not currently use drugs after having used the following drugs: Marijuana. She reports that she does  not drink alcohol.  Family History  Problem Relation Age of Onset   Hypertension Mother    Healthy Father    Diabetes Other     The following portions of the patient's history were reviewed and updated as appropriate: allergies, current medications, past family history, past medical history, past social history, past surgical history and problem list.  Review of Systems Pertinent items noted in HPI and remainder of comprehensive ROS otherwise negative.  Physical Exam:  BP 110/69   Pulse 62   Wt 155 lb (70.3 kg)   LMP 09/16/2022   BMI 26.61 kg/m  CONSTITUTIONAL: Well-developed, well-nourished female in no acute distress.  HENT:  Normocephalic, atraumatic, External right and left ear normal.  EYES: Conjunctivae and EOM are normal. Pupils are equal, round, and reactive to light. No scleral icterus.  SKIN: Skin is warm and dry. No rash noted. Not diaphoretic. No erythema. No pallor. MUSCULOSKELETAL: Normal range of motion. No tenderness.  No cyanosis, clubbing, or edema. NEUROLOGIC: Alert and oriented to person, place, and time. Normal reflexes, muscle tone coordination.  PSYCHIATRIC: Normal mood and affect. Normal behavior. Normal judgment and thought content. CARDIOVASCULAR: Normal heart rate noted, regular rhythm RESPIRATORY: Normal WOB   Assessment and Plan:    1. Bacterial vaginosis - Self swab collected in office - Consider RepHresh after intercourse or immediately after period, may reduce need for rx medication  2. Screening for STDs (sexually transmitted diseases)  - Cervicovaginal ancillary only  3. Encounter for surveillance of transdermal patch hormonal contraceptive device - Refills available   Routine preventative health maintenance measures emphasized. Please refer to After Visit Summary for other counseling recommendations.     Clayton Bibles, MSA, MSN, CNM Certified Nurse Midwife, Biochemist, clinical for [employer], [facility]

## 2022-10-29 LAB — CERVICOVAGINAL ANCILLARY ONLY
Bacterial Vaginitis (gardnerella): POSITIVE — AB
Candida Glabrata: NEGATIVE
Candida Vaginitis: NEGATIVE
Chlamydia: NEGATIVE
Comment: NEGATIVE
Comment: NEGATIVE
Comment: NEGATIVE
Comment: NEGATIVE
Comment: NEGATIVE
Comment: NORMAL
Neisseria Gonorrhea: NEGATIVE
Trichomonas: NEGATIVE

## 2022-10-31 ENCOUNTER — Other Ambulatory Visit: Payer: Self-pay | Admitting: *Deleted

## 2022-10-31 DIAGNOSIS — N898 Other specified noninflammatory disorders of vagina: Secondary | ICD-10-CM

## 2022-10-31 MED ORDER — METRONIDAZOLE 0.75 % VA GEL: 1.0000 | Freq: Every day | VAGINAL | 0 refills | Status: DC

## 2022-12-19 ENCOUNTER — Emergency Department: Payer: Medicaid Other

## 2022-12-19 ENCOUNTER — Encounter: Payer: Self-pay | Admitting: Emergency Medicine

## 2022-12-19 ENCOUNTER — Other Ambulatory Visit: Payer: Self-pay

## 2022-12-19 ENCOUNTER — Emergency Department
Admission: EM | Admit: 2022-12-19 | Discharge: 2022-12-19 | Disposition: A | Discharge status: Home / Self Care | Payer: Medicaid Other | Attending: Emergency Medicine | Admitting: Emergency Medicine

## 2022-12-19 DIAGNOSIS — R0789 Other chest pain: Secondary | ICD-10-CM | POA: Insufficient documentation

## 2022-12-19 DIAGNOSIS — R079 Chest pain, unspecified: Secondary | ICD-10-CM

## 2022-12-19 HISTORY — DX: Anxiety disorder, unspecified: F41.9

## 2022-12-19 HISTORY — DX: Panic disorder (episodic paroxysmal anxiety): F41.0

## 2022-12-19 LAB — BASIC METABOLIC PANEL
Anion gap: 8 (ref 5–15)
BUN: 9 mg/dL (ref 6–20)
CO2: 25 mmol/L (ref 22–32)
Calcium: 9 mg/dL (ref 8.9–10.3)
Chloride: 105 mmol/L (ref 98–111)
Creatinine, Ser: 0.73 mg/dL (ref 0.44–1.00)
GFR, Estimated: >60 mL/min (ref >60)
Glucose, Bld: 101 mg/dL — ABNORMAL HIGH (ref 70–99)
Potassium: 3.8 mmol/L (ref 3.5–5.1)
Sodium: 138 mmol/L (ref 135–145)

## 2022-12-19 LAB — TROPONIN I (HIGH SENSITIVITY): Troponin I (High Sensitivity): <2 ng/L (ref <18)

## 2022-12-19 LAB — POC URINE PREG, ED: Preg Test, Ur: NEGATIVE

## 2022-12-19 LAB — CBC
HCT: 37.5 % (ref 36.0–46.0)
Hemoglobin: 12 g/dL (ref 12.0–15.0)
MCH: 29.1 pg (ref 26.0–34.0)
MCHC: 32 g/dL (ref 30.0–36.0)
MCV: 90.8 fL (ref 80.0–100.0)
Platelets: 221 10*3/uL (ref 150–400)
RBC: 4.13 MIL/uL (ref 3.87–5.11)
RDW: 13.2 % (ref 11.5–15.5)
WBC: 3.2 10*3/uL — ABNORMAL LOW (ref 4.0–10.5)
nRBC: 0 % (ref 0.0–0.2)

## 2022-12-19 MED ORDER — NAPROXEN 500 MG PO TABS: 500.0000 mg | ORAL_TABLET | Freq: Two times a day (BID) | ORAL | 0 refills | Status: AC

## 2022-12-19 MED ORDER — KETOROLAC TROMETHAMINE 15 MG/ML IJ SOLN
15.0000 mg | Freq: Once | INTRAMUSCULAR | Status: AC
  Administered 2022-12-19: 15 mg via INTRAMUSCULAR
  Filled 2022-12-19: qty 1

## 2022-12-19 NOTE — Discharge Instructions (Signed)
You were seen in the Emergency Department today for evaluation of your chest pain. Fortunately, your labs, EKG, and chest x-[place] were overall reassuring against a emergency cause for your pain.  I have included information for follow-up with your primary care team.  I sent a prescription for naproxen to your pharmacy that you can take to help with your symptoms.  Return to the ER for new or worsening symptoms.

## 2022-12-19 NOTE — ED Provider Notes (Signed)
[facility] Provider Note    Event Date/Time   First MD Initiated Contact with Patient 12/19/22 1024     (approximate)   History   Chest Pain   HPI  Kelli Soto is a 27 y.o. female with history of anxiety presenting to the emergency department for evaluation of chest pain.  For the past 2 days, patient has had chest pain over her left chest as well as over the right side of her body.  She does report that her pain is worse with palpation.  No recent cough, congestion.  Reports 1 episode of chest pain in the past that was attributed to anxiety, but reports that this feels slightly different.  Denies any recent heavy lifting or identifiable musculoskeletal strain.  Pain is worse with coughing.     Physical Exam   Triage Vital Signs: ED Triage Vitals  Enc Vitals Group     BP 12/19/22 1016 118/70     Pulse Rate 12/19/22 1016 64     Resp 12/19/22 1016 16     Temp 12/19/22 1016 98.3 F (36.8 C)     Temp Source 12/19/22 1016 Oral     SpO2 12/19/22 1016 100 %     Weight 12/19/22 1014 154 lb 15.7 oz (70.3 kg)     Height 12/19/22 1014 5\' 4"  (1.626 m)     Head Circumference --      Peak Flow --      Pain Score 12/19/22 1014 8     Pain Loc --      Pain Edu? --      Excl. in GC? --     Most recent vital signs: Vitals:   12/19/22 1016  BP: 118/70  Pulse: 64  Resp: 16  Temp: 98.3 F (36.8 C)  SpO2: 100%     General: Awake, interactive  CV:  Regular rate, good peripheral perfusion.  Chest wall: Reproducible tenderness palpation over the left anterior chest wall Resp:  Lungs clear, unlabored respirations.  Abd:  Soft, nondistended.  Neuro:  Symmetric facial movement, fluid speech   ED Results / Procedures / Treatments   Labs (all labs ordered are listed, but only abnormal results are displayed) Labs Reviewed  BASIC METABOLIC PANEL - Abnormal; Notable for the following components:      Result Value   Glucose, Bld 101 (*)    All other  components within normal limits  CBC - Abnormal; Notable for the following components:   WBC 3.2 (*)    All other components within normal limits  POC URINE PREG, ED  TROPONIN I (HIGH SENSITIVITY)     EKG EKG independently reviewed interpreted by myself (ER attending) demonstrates:  EKG demonstrates sinus rhythm and rate of 81, PR 148, QRS 82, QTc 450, no acute ST changes  RADIOLOGY Imaging independently reviewed and interpreted by myself demonstrates:  CXR without evidence of pneumonia  PROCEDURES:  Critical Care performed: No  Procedures   MEDICATIONS ORDERED IN ED: Medications - No data to display   IMPRESSION / MDM / ASSESSMENT AND PLAN / ED COURSE  I reviewed the triage vital signs and the nursing notes.  Differential diagnosis includes, but is not limited to, pneumonia, pneumothorax, musculoskeletal strain, pleurisy, lower suspicion ACS, low risk PE and PERC negative  Patient's presentation is most consistent with acute presentation with potential threat to life or bodily function.  27 year old female presenting to the emergency department for evaluation of chest pain.  Labs,  EKG, x-Casmer Yepiz reassuring.  She was given Toradol.  Reports significant improvement in her pain following this.  Overall low suspicion for significant acute pathology.  Discussed part of care.  Will DC with prescription for naproxen.  Strict return precautions provided.     FINAL CLINICAL IMPRESSION(S) / ED DIAGNOSES   Final diagnoses:  None     Rx / DC Orders   ED Discharge Orders     None        Note:  This document was prepared using Dragon voice recognition software and may include unintentional dictation errors.   Trinna Post, MD 12/19/22 [phone]

## 2022-12-19 NOTE — ED Triage Notes (Signed)
C/O chest pain x 2 days. Also c/o right side of bedy feeling tight.  States chest pain worse when pressing on left breast.  Patient with history of anxiety. Appears anxious.  Has been crying today.  Patient states coughing makes chest pain worse.  AAOx3.  Skin warm and dry. NAD

## 2023-02-27 ENCOUNTER — Ambulatory Visit
Admission: EM | Admit: 2023-02-27 | Discharge: 2023-02-27 | Disposition: A | Discharge status: Home / Self Care | Payer: Medicaid Other | Attending: Emergency Medicine | Admitting: Emergency Medicine

## 2023-02-27 DIAGNOSIS — Z20822 Contact with and (suspected) exposure to covid-19: Secondary | ICD-10-CM | POA: Diagnosis not present

## 2023-02-27 DIAGNOSIS — B349 Viral infection, unspecified: Secondary | ICD-10-CM

## 2023-02-27 DIAGNOSIS — R110 Nausea: Secondary | ICD-10-CM

## 2023-02-27 MED ORDER — ONDANSETRON 4 MG PO TBDP: 4.0000 mg | ORAL_TABLET | Freq: Three times a day (TID) | ORAL | 0 refills | Status: DC | PRN

## 2023-02-27 NOTE — ED Triage Notes (Addendum)
Patient to Urgent Care with complaints of runny nose/nausea/ headaches. Denies any VD.   Symptoms started last night. Reports both daughters Covid positive.

## 2023-02-27 NOTE — ED Provider Notes (Signed)
[name]    CSN: [id number] Arrival date & time: 02/27/23  0858      History   Chief Complaint Chief Complaint  Patient presents with   Nasal Congestion   Nausea    HPI Kelli Soto is a 27 y.o. female.  Accompanied by her daughters who tested positive for COVID at home yesterday, patient presents with headache, runny nose, and nausea since last night.  No fever, cough, shortness of breath, diarrhea, vomiting, or other symptoms.  Treating symptoms with ibuprofen.  The history is provided by the patient and medical records.    Past Medical History:  Diagnosis Date   Anemia    Anxiety    Bronchitis    Chlamydia 02/19/2017   Gonorrhea 2019   Neutropenia (HCC)    Panic attack     Patient Active Problem List   Diagnosis Date Noted   Generalized body aches 05/28/2022   Migraine without aura and without status migrainosus, not intractable 12/24/2019   Low iron 02/10/2019   Low serum vitamin D 12/29/2018   Neutropenia (HCC) 12/29/2018    Past Surgical History:  Procedure Laterality Date   CESAREAN SECTION MULTI-GESTATIONAL N/A 06/10/2019   Procedure: CESAREAN SECTION MULTI-GESTATIONAL;  Surgeon: Catalina Antigua, MD;  Location: [facility] LD ORS;  Service: Obstetrics;  Laterality: N/A;    OB History     Gravida  2   Para  2   Term  1   Preterm  1   AB      Living  3      SAB      IAB      Ectopic      Multiple  1   Live Births  3            Home Medications    Prior to Admission medications   Medication Sig Start Date End Date Taking? Authorizing Provider  ondansetron (ZOFRAN-ODT) 4 MG disintegrating tablet Take 1 tablet (4 mg total) by mouth every 8 (eight) hours as needed for nausea or vomiting. 02/27/23  Yes Mickie Bail, NP  diclofenac (VOLTAREN) 75 MG EC tablet Take by oral route for 30 days. Patient not taking: Reported on 10/25/2022    [provider]  fluconazole (DIFLUCAN) 150 MG tablet TAKE 1 TABLET (150 MG TOTAL)  BY MOUTH ONCE FOR 1 DOSE. MAY REPEAT 3 DAYS LATER IF SYMPTOMS PERSIST Patient not taking: Reported on 10/25/2022    [provider]  hydrOXYzine (ATARAX) 25 MG tablet Take 1 tablet (25 mg total) by mouth every 6 (six) hours. Patient not taking: Reported on 10/25/2022 11/22/21   Becky Augusta, NP  meloxicam (MOBIC) 15 MG tablet TAKE 1 TABLET BY MOUTH EVERY DAY WITH MEALS Patient not taking: Reported on 10/25/2022    [provider]  methocarbamol (ROBAXIN) 500 MG tablet Take 1 tablet by mouth as needed. Patient not taking: Reported on 10/25/2022    [provider]  metroNIDAZOLE (METROGEL) 0.75 % vaginal gel PLACE 1 APPLICATORFUL VAGINALLY AT BEDTIME FOR 5 DAYS. Patient not taking: Reported on 10/25/2022 05/17/22   Tereso Newcomer, MD  metroNIDAZOLE (METROGEL) 0.75 % vaginal gel Place 1 Applicatorful vaginally at bedtime. Apply one applicatorful to vagina at bedtime for 5 days Patient not taking: Reported on 02/27/2023 10/31/22   Calvert Cantor, CNM  naproxen (NAPROSYN) 500 MG tablet Take 1 tablet twice a day by oral route. Patient not taking: Reported on 10/25/2022 04/02/22   [provider]  norelgestromin-ethinyl estradiol (XULANE) 150-35 MCG/24HR transdermal patch PLACE 1 PATCH ONTO THE SKIN ONCE A WEEK FOR 3 WEEKS LEAVE OFF FOR THE 4TH TO HAVE YOUR PERIOD 04/02/22   Federico Flake, MD  tinidazole (TINDAMAX) 500 MG tablet Take 4 tablets (2,000 mg total) by mouth daily with breakfast. For two days Patient not taking: Reported on 04/02/2022 01/16/21   Anyanwu, Jethro Bastos, MD  traMADol (ULTRAM) 50 MG tablet Take 1 tablet (50 mg total) by mouth every 6 (six) hours as needed. Patient not taking: Reported on 10/25/2022 09/27/22   Chinita Pester, FNP    Family History Family History  Problem Relation Age of Onset   Hypertension Mother    Healthy Father    Diabetes Other     Social History Social History   Tobacco Use   Smoking status: Never   Smokeless  tobacco: Never  Vaping Use   Vaping status: Never Used  Substance Use Topics   Alcohol use: No   Drug use: Not Currently    Types: Marijuana    Comment: last smoked 2 weeks from August 28th     Allergies   Patient has no known allergies.   Review of Systems Review of Systems  Constitutional:  Negative for chills and fever.  HENT:  Positive for rhinorrhea. Negative for ear pain and sore throat.   Respiratory:  Negative for cough and shortness of breath.   Cardiovascular:  Negative for chest pain and palpitations.  Gastrointestinal:  Positive for nausea. Negative for abdominal pain, diarrhea and vomiting.  Skin:  Negative for color change and rash.  Neurological:  Positive for headaches.  All other systems reviewed and are negative.    Physical Exam Triage Vital Signs ED Triage Vitals  Encounter Vitals Group     BP 02/27/23 0943 112/76     Systolic BP Percentile --      Diastolic BP Percentile --      Pulse Rate 02/27/23 0931 78     Resp 02/27/23 0931 18     Temp 02/27/23 0931 98.7 F (37.1 C)     Temp src --      SpO2 02/27/23 0931 97 %     Weight --      Height --      Head Circumference --      Peak Flow --      Pain Score 02/27/23 0928 6     Pain Loc --      Pain Education --      Exclude from Growth Chart --    No data found.  Updated Vital Signs BP 112/76   Pulse 78   Temp 98.7 F (37.1 C)   Resp 18   LMP 02/10/2023   SpO2 97%   Visual Acuity Right Eye Distance:   Left Eye Distance:   Bilateral Distance:    Right Eye Near:   Left Eye Near:    Bilateral Near:     Physical Exam Vitals and nursing note reviewed.  Constitutional:      General: She is not in acute distress.    Appearance: Normal appearance. She is well-developed.  HENT:     Right Ear: Tympanic membrane normal.     Left Ear: Tympanic membrane normal.     Nose: Rhinorrhea present.     Mouth/Throat:     Mouth: Mucous membranes are moist.     Pharynx: Oropharynx is clear.   Cardiovascular:     Rate and Rhythm: Normal  rate and regular rhythm.     Heart sounds: Normal heart sounds.  Pulmonary:     Effort: Pulmonary effort is normal. No respiratory distress.     Breath sounds: Normal breath sounds.  Abdominal:     General: Bowel sounds are normal.     Palpations: Abdomen is soft.     Tenderness: There is no abdominal tenderness.  Musculoskeletal:     Cervical back: Neck supple.  Skin:    General: Skin is warm and dry.  Neurological:     Mental Status: She is alert.  Psychiatric:        Mood and Affect: Mood normal.        Behavior: Behavior normal.      UC Treatments / Results  Labs (all labs ordered are listed, but only abnormal results are displayed) Labs Reviewed - No data to display  EKG   Radiology No results found.  Procedures Procedures (including critical care time)  Medications Ordered in UC Medications - No data to display  Initial Impression / Assessment and Plan / UC Course  I have reviewed the triage vital signs and the nursing notes.  Pertinent labs & imaging results that were available during my care of the patient were reviewed by me and considered in my medical decision making (see chart for details).    Viral illness, exposure to COVID, nausea without vomiting.  Vital signs are stable.  Treating nausea with Zofran.  Instructed patient to stay hydrated with clear liquids such as water.  Instructed her to follow-up with her PCP.  Education provided on nausea and viral illness.  ED precautions given.  Patient agrees to plan of care.  Final Clinical Impressions(s) / UC Diagnoses   Final diagnoses:  Exposure to COVID-19 virus  Viral illness  Nausea without vomiting     Discharge Instructions      Take the antinausea medication as directed.    Keep yourself hydrated with clear liquids, such as water and Gatorade.    Follow up with your primary care provider.          ED Prescriptions     Medication Sig  Dispense Auth. Provider   ondansetron (ZOFRAN-ODT) 4 MG disintegrating tablet Take 1 tablet (4 mg total) by mouth every 8 (eight) hours as needed for nausea or vomiting. 20 tablet Mickie Bail, NP      PDMP not reviewed this encounter.   Mickie Bail, NP 02/27/23 1023

## 2023-02-27 NOTE — Discharge Instructions (Addendum)
Take the antinausea medication as directed.    Keep yourself hydrated with clear liquids, such as water and Gatorade.      Follow up with your primary care provider.

## 2023-03-10 ENCOUNTER — Emergency Department (HOSPITAL_COMMUNITY): Payer: Medicaid Other

## 2023-03-10 ENCOUNTER — Emergency Department (HOSPITAL_COMMUNITY)
Admission: EM | Admit: 2023-03-10 | Discharge: 2023-03-10 | Disposition: A | Discharge status: Home / Self Care | Payer: Medicaid Other | Attending: Emergency Medicine | Admitting: Emergency Medicine

## 2023-03-10 ENCOUNTER — Encounter (HOSPITAL_COMMUNITY): Payer: Self-pay | Admitting: *Deleted

## 2023-03-10 ENCOUNTER — Other Ambulatory Visit: Payer: Self-pay

## 2023-03-10 DIAGNOSIS — O26899 Other specified pregnancy related conditions, unspecified trimester: Secondary | ICD-10-CM | POA: Insufficient documentation

## 2023-03-10 DIAGNOSIS — S81012A Laceration without foreign body, left knee, initial encounter: Secondary | ICD-10-CM | POA: Insufficient documentation

## 2023-03-10 DIAGNOSIS — Z23 Encounter for immunization: Secondary | ICD-10-CM | POA: Insufficient documentation

## 2023-03-10 DIAGNOSIS — O9A219 Injury, poisoning and certain other consequences of external causes complicating pregnancy, unspecified trimester: Secondary | ICD-10-CM | POA: Diagnosis present

## 2023-03-10 DIAGNOSIS — Z3A00 Weeks of gestation of pregnancy not specified: Secondary | ICD-10-CM | POA: Diagnosis not present

## 2023-03-10 DIAGNOSIS — W19XXXA Unspecified fall, initial encounter: Secondary | ICD-10-CM | POA: Insufficient documentation

## 2023-03-10 MED ORDER — ONDANSETRON HCL 4 MG/2ML IJ SOLN
4.0000 mg | Freq: Once | INTRAMUSCULAR | Status: AC
  Administered 2023-03-10: 4 mg via INTRAVENOUS
  Filled 2023-03-10: qty 2

## 2023-03-10 MED ORDER — CEPHALEXIN 500 MG PO CAPS: 500.0000 mg | ORAL_CAPSULE | Freq: Two times a day (BID) | ORAL | 0 refills | Status: AC

## 2023-03-10 MED ORDER — TETANUS-DIPHTH-ACELL PERTUSSIS 5-2.5-18.5 LF-MCG/0.5 IM SUSY
0.5000 mL | PREFILLED_SYRINGE | Freq: Once | INTRAMUSCULAR | Status: AC
  Administered 2023-03-10: 0.5 mL via INTRAMUSCULAR
  Filled 2023-03-10: qty 0.5

## 2023-03-10 MED ORDER — BACITRACIN ZINC 500 UNIT/GM EX OINT
TOPICAL_OINTMENT | Freq: Two times a day (BID) | CUTANEOUS | Status: DC
  Administered 2023-03-10: 1 via TOPICAL
  Filled 2023-03-10 (×2): qty 0.9

## 2023-03-10 MED ORDER — KETOROLAC TROMETHAMINE 15 MG/ML IJ SOLN
15.0000 mg | Freq: Once | INTRAMUSCULAR | Status: AC
  Administered 2023-03-10: 15 mg via INTRAVENOUS
  Filled 2023-03-10: qty 1

## 2023-03-10 MED ORDER — ONDANSETRON HCL 4 MG/2ML IJ SOLN
4.0000 mg | Freq: Once | INTRAMUSCULAR | Status: AC
  Administered 2023-03-10: 4 mg via INTRAVENOUS

## 2023-03-10 MED ORDER — OXYCODONE-ACETAMINOPHEN 5-325 MG PO TABS
1.0000 | ORAL_TABLET | Freq: Four times a day (QID) | ORAL | 0 refills | Status: DC | PRN
Start: 2023-03-10 — End: 2023-10-17

## 2023-03-10 MED ORDER — LIDOCAINE-EPINEPHRINE (PF) 2 %-1:200000 IJ SOLN
10.0000 mL | Freq: Once | INTRAMUSCULAR | Status: AC
  Administered 2023-03-10: 10 mL via INTRADERMAL
  Filled 2023-03-10: qty 20

## 2023-03-10 NOTE — Discharge Instructions (Addendum)
You are seen in the ER today for your knee injury after a fall.  You are missing a large portion of skin from the knee however it was partially repaired with sutures.  These may be removed in 7 to 10 days.  He may follow-up with urgent care, ER, or your primary care doctor for suture removal.  Please take the prescribed antibiotics for the entire course to help prevent infection keep the wound clean dry and covered.  Return to the ER with any redness, swelling, puslike drainage from the area, or any other new severe symptom.

## 2023-03-10 NOTE — ED Triage Notes (Signed)
Pt says she tripped and fell. ETOH and marijuana use. She has an avulsion to the left knee, abrasions to the left palm, right knee and right thigh. She denies hitting her head or LOC.

## 2023-03-10 NOTE — ED Provider Notes (Signed)
[place] EMERGENCY DEPARTMENT AT [facility] Provider Note   CSN: [id number] Arrival date & time: 03/10/23  0242     History  Chief Complaint  Patient presents with  . Fall    Kelli Soto is a 27 y.o. female who presents with abrasion to left hand and large laceration over left anterior knee after a fall Kelli Soto experienced while leaving a bar earlier this morning.  Patient quite intoxicated with alcohol, weeping.  History of migraines, panic attacks.  G2 P1-1-0-3 no anticoagulation.  No medications Kelli Soto takes daily  HPI     Home Medications Prior to Admission medications   Medication Sig Start Date End Date Taking? Authorizing Provider  cephALEXin (KEFLEX) 500 MG capsule Take 1 capsule (500 mg total) by mouth 2 (two) times daily for 7 days. 03/10/23 03/17/23 Yes Ayzia Day, Eugene Gavia, PA-C  oxyCODONE-acetaminophen (PERCOCET/ROXICET) 5-325 MG tablet Take 1 tablet by mouth every 6 (six) hours as needed for severe pain. 03/10/23  Yes Stephanne Greeley, Eugene Gavia, PA-C  diclofenac (VOLTAREN) 75 MG EC tablet Take by oral route for 30 days. Patient not taking: Reported on 10/25/2022    [provider]  fluconazole (DIFLUCAN) 150 MG tablet TAKE 1 TABLET (150 MG TOTAL) BY MOUTH ONCE FOR 1 DOSE. MAY REPEAT 3 DAYS LATER IF SYMPTOMS PERSIST Patient not taking: Reported on 10/25/2022    [provider]  hydrOXYzine (ATARAX) 25 MG tablet Take 1 tablet (25 mg total) by mouth every 6 (six) hours. Patient not taking: Reported on 10/25/2022 11/22/21   Becky Augusta, NP  meloxicam (MOBIC) 15 MG tablet TAKE 1 TABLET BY MOUTH EVERY DAY WITH MEALS Patient not taking: Reported on 10/25/2022    [provider]  methocarbamol (ROBAXIN) 500 MG tablet Take 1 tablet by mouth as needed. Patient not taking: Reported on 10/25/2022    [provider]  metroNIDAZOLE (METROGEL) 0.75 % vaginal gel PLACE 1 APPLICATORFUL VAGINALLY AT BEDTIME FOR 5 DAYS. Patient not taking: Reported on  10/25/2022 05/17/22   Tereso Newcomer, MD  metroNIDAZOLE (METROGEL) 0.75 % vaginal gel Place 1 Applicatorful vaginally at bedtime. Apply one applicatorful to vagina at bedtime for 5 days Patient not taking: Reported on 02/27/2023 10/31/22   Calvert Cantor, CNM  naproxen (NAPROSYN) 500 MG tablet Take 1 tablet twice a day by oral route. Patient not taking: Reported on 10/25/2022 04/02/22   [provider]  norelgestromin-ethinyl estradiol [employer]) 150-35 MCG/24HR transdermal patch PLACE 1 PATCH ONTO THE SKIN ONCE A WEEK FOR 3 WEEKS LEAVE OFF FOR THE 4TH TO HAVE YOUR PERIOD 04/02/22   Federico Flake, MD  ondansetron (ZOFRAN-ODT) 4 MG disintegrating tablet Take 1 tablet (4 mg total) by mouth every 8 (eight) hours as needed for nausea or vomiting. 02/27/23   Mickie Bail, NP  tinidazole (TINDAMAX) 500 MG tablet Take 4 tablets (2,000 mg total) by mouth daily with breakfast. For two days Patient not taking: Reported on 04/02/2022 01/16/21   Anyanwu, Jethro Bastos, MD  traMADol (ULTRAM) 50 MG tablet Take 1 tablet (50 mg total) by mouth every 6 (six) hours as needed. Patient not taking: Reported on 10/25/2022 09/27/22   Chinita Pester, FNP      Allergies    Patient has no known allergies.    Review of Systems   Review of Systems  Skin:  Positive for wound.    Physical Exam Updated Vital Signs BP (!) 141/95   Pulse (!) 120   Temp 98 F (  36.7 C) (Oral)   Resp 20   LMP 02/10/2023   SpO2 96%  Physical Exam Vitals and nursing note reviewed.  Constitutional:      Appearance: Kelli Soto is not ill-appearing or toxic-appearing.  HENT:     Head: Normocephalic and atraumatic.     Mouth/Throat:     Mouth: Mucous membranes are moist.     Pharynx: No oropharyngeal exudate or posterior oropharyngeal erythema.  Eyes:     General: No scleral icterus.       Right eye: No discharge.        Left eye: No discharge.     Conjunctiva/sclera: Conjunctivae normal.  Cardiovascular:     Rate and Rhythm:  Normal rate and regular rhythm.     Pulses: Normal pulses.     Heart sounds: Normal heart sounds. No murmur heard. Pulmonary:     Effort: Pulmonary effort is normal. No respiratory distress.     Breath sounds: Normal breath sounds. No wheezing or rales.  Abdominal:     General: Bowel sounds are normal. There is no distension.     Palpations: Abdomen is soft.     Tenderness: There is no abdominal tenderness. There is no guarding or rebound.  Musculoskeletal:        General: No deformity.     Right shoulder: Normal.     Left shoulder: Normal.     Right upper arm: Normal.     Left upper arm: Normal.     Right elbow: Normal.     Left elbow: Normal.     Right forearm: Normal.     Left forearm: Normal.     Right wrist: Normal. No snuff box tenderness.     Left wrist: Normal. No snuff box tenderness.     Right hand: Normal.     Left hand: Normal.       Arms:     Cervical back: Normal and neck supple.     Thoracic back: Normal.     Lumbar back: Normal.     Right hip: Normal.     Left hip: Normal.     Right upper leg: Normal.     Left upper leg: Normal.     Right knee: Normal.     Left knee: Laceration present. Decreased range of motion. Tenderness present.     Right lower leg: Normal. No edema.     Left lower leg: Normal. No edema.     Right ankle: Normal.     Right Achilles Tendon: Normal.     Left ankle: Normal.     Left Achilles Tendon: Normal.     Right foot: Normal.     Left foot: Normal.       Legs:     Comments: Neurovascularly intact in BLE.   Skin:    General: Skin is warm and dry.     Capillary Refill: Capillary refill takes less than 2 seconds.  Neurological:     General: No focal deficit present.     Mental Status: Kelli Soto is alert. Mental status is at baseline.  Psychiatric:        Mood and Affect: Mood normal.    ED Results / Procedures / Treatments   Labs (all labs ordered are listed, but only abnormal results are displayed) Labs Reviewed - No data to  display  EKG None  Radiology DG Hand 2 View Left  Result Date: 03/10/2023 CLINICAL DATA:  Fall with anterior pain EXAM: LEFT HAND - 2 VIEW  COMPARISON:  None Available. FINDINGS: There is no evidence of fracture or dislocation. There is no evidence of arthropathy or other focal bone abnormality. Soft tissues are unremarkable. IMPRESSION: Negative. Electronically Signed   By: Tiburcio Pea M.D.   On: 03/10/2023 06:40   DG Knee Complete 4 Views Left  Result Date: 03/10/2023 CLINICAL DATA:  Status post fall. EXAM: LEFT KNEE - COMPLETE 4+ VIEW COMPARISON:  None Available. FINDINGS: No evidence of fracture, dislocation, or joint effusion. No evidence of arthropathy or other focal bone abnormality. A small superficial soft tissue laceration is seen along the anterior aspect of the proximal left tibia. IMPRESSION: Small, superficial anterior soft tissue laceration without evidence of an acute osseous abnormality. Electronically Signed   By: Aram Candela M.D.   On: 03/10/2023 04:00    Procedures .Marland KitchenLaceration Repair  Date/Time: 03/10/2023 7:45 AM  Performed by: Paris Lore, PA-C Authorized by: Paris Lore, PA-C   Consent:    Consent obtained:  Verbal   Risks discussed:  Infection, need for additional repair, nerve damage, poor wound healing, poor cosmetic result, pain, retained foreign body, tendon damage and vascular damage   Alternatives discussed:  No treatment and delayed treatment Universal protocol:    Patient identity confirmed:  Verbally with patient Anesthesia:    Anesthesia method:  Local infiltration   Local anesthetic:  Lidocaine 2% WITH epi Laceration details:    Location:  Leg   Leg location:  L knee   Length (cm):  2 (Round wound with surrounding avulsion over anterior inferior knee.) Pre-procedure details:    Preparation:  Patient was prepped and draped in usual sterile fashion and imaging obtained to evaluate for foreign bodies Exploration:     Imaging obtained: x-ray     Imaging outcome: foreign body not noted     Wound exploration: entire depth of wound visualized     Wound extent: fascia not violated, no foreign body, no signs of injury, no tendon damage, no underlying fracture and no vascular damage     Contaminated: no   Treatment:    Area cleansed with:  Saline   Amount of cleaning:  Standard   Irrigation solution:  Sterile saline   Irrigation volume:  500 Skin repair:    Repair method:  Sutures   Suture size:  3-0   Suture material:  Prolene   Suture technique:  Horizontal mattress   Number of sutures:  3 Approximation:    Approximation:  Loose Repair type:    Repair type:  Intermediate Post-procedure details:    Dressing:  Non-adherent dressing   Procedure completion:  Tolerated well, no immediate complications     Medications Ordered in ED Medications  bacitracin ointment (1 Application Topical Given 03/10/23 0620)  ketorolac (TORADOL) 15 MG/ML injection 15 mg (15 mg Intravenous Given 03/10/23 0620)  lidocaine-EPINEPHrine (XYLOCAINE W/EPI) 2 %-1:200000 (PF) injection 10 mL (10 mLs Intradermal Given 03/10/23 0619)  Tdap (BOOSTRIX) injection 0.5 mL (0.5 mLs Intramuscular Given 03/10/23 0621)  ondansetron (ZOFRAN) injection 4 mg (4 mg Intravenous Given 03/10/23 0708)  ondansetron (ZOFRAN) injection 4 mg (4 mg Intravenous Given 03/10/23 0725)    ED Course/ Medical Decision Making/ A&P                                 Medical Decision Making 27 y/o with anterior knee laceration.   Wound will be a difficult repair due to extensive missing skin  over the anterior knee, however given the gaping nature of the knee injury do feel attempted repair is favorable over observation.  Will initiate prophylactic antibiotics as well.  Boostrix updated.  Amount and/or Complexity of Data Reviewed Radiology: ordered.  Risk OTC drugs. Prescription drug management.   Wound as above, dressed with nonadherent dressing.  Strict  return precautions were given to the patient. Crutches provided.   Kelli Soto  voiced understanding of Kelli Soto medical evaluation and treatment plan. Each of their questions answered to their expressed satisfaction.  Return precautions were given.  Patient is well-appearing, stable, and was discharged in good condition.  This chart was dictated using voice recognition software, Dragon. Despite the best efforts of this provider to proofread and correct errors, errors may still occur which can change documentation meaning.          Final Clinical Impression(s) / ED Diagnoses Final diagnoses:  Laceration of left knee, initial encounter    Rx / DC Orders ED Discharge Orders          Ordered    cephALEXin (KEFLEX) 500 MG capsule  2 times daily        03/10/23 0731    oxyCODONE-acetaminophen (PERCOCET/ROXICET) 5-325 MG tablet  Every 6 hours PRN        03/10/23 0731              Thadius Smisek, Eugene Gavia, PA-C 03/10/23 0750    Glynn Octave, MD 03/10/23 1900

## 2023-03-11 ENCOUNTER — Ambulatory Visit
Admission: EM | Admit: 2023-03-11 | Discharge: 2023-03-11 | Disposition: A | Discharge status: Home / Self Care | Payer: Medicaid Other | Attending: Emergency Medicine | Admitting: Emergency Medicine

## 2023-03-11 DIAGNOSIS — S81002A Unspecified open wound, left knee, initial encounter: Secondary | ICD-10-CM | POA: Insufficient documentation

## 2023-03-11 MED ORDER — TRAMADOL HCL 50 MG PO TABS
50.0000 mg | ORAL_TABLET | Freq: Four times a day (QID) | ORAL | 0 refills | Status: DC | PRN
Start: 2023-03-11 — End: 2023-10-17

## 2023-03-11 MED ORDER — CHLORHEXIDINE GLUCONATE 4 % EX SOLN: Freq: Every day | CUTANEOUS | 0 refills | Status: DC

## 2023-03-11 NOTE — ED Triage Notes (Signed)
Pt was seen at [facility] on 9/22 for a left knee injury. Pt is concerned about the wound and is having pain.

## 2023-03-11 NOTE — Discharge Instructions (Signed)
Today your wound on your knee has been evaluated  All sutures have been removed as tissue started to grow over them  Wound has been cleansed here in the office and Steri-Strips applied, do not remove Steri-Strips and allow them to fall off naturally, as they begin to fall off you may trim the sides, even if Steri-Strips look dirty they are protective to the wound, do not remove  Do not allow Steri-Strips to become soaking wet, may become damp during normal hygiene  Cleanse area daily with soap water and Hibiclens, pat and do not rub, may cover with a nonstick dressing or leave open book   Continue antibiotics as prescribed  You may use tramadol every 6 hours as needed for severe pain, please be mindful that this may make you feel sleepy  Probably do not pillows whenever sitting and lying for support and comfort  Continue activity as tolerated,   may follow-up with urgent care as needed

## 2023-03-11 NOTE — ED Provider Notes (Signed)
[name]    CSN: [id number] Arrival date & time: 03/11/23  0904      History   Chief Complaint Chief Complaint  Patient presents with   Wound Check    HPI Kelli Soto is a 27 y.o. female.   Patient presents for evaluation of a wound check to the left knee that occurred 1 day ago.  Was initially evaluated in the emergency department where sutures were placed as well as a dressing.  Patient concerned due to the amount of significant pain experience at rest and with all movements.  Has also noticed drainage from the wound site.  Denies presence of fevers.  Has been taking antibiotic and pain medicine as directed.   Past Medical History:  Diagnosis Date   Anemia    Anxiety    Bronchitis    Chlamydia 02/19/2017   Gonorrhea 2019   Neutropenia (HCC)    Panic attack     Patient Active Problem List   Diagnosis Date Noted   Generalized body aches 05/28/2022   Migraine without aura and without status migrainosus, not intractable 12/24/2019   Low iron 02/10/2019   Low serum vitamin D 12/29/2018   Neutropenia (HCC) 12/29/2018    Past Surgical History:  Procedure Laterality Date   CESAREAN SECTION MULTI-GESTATIONAL N/A 06/10/2019   Procedure: CESAREAN SECTION MULTI-GESTATIONAL;  Surgeon: Catalina Antigua, MD;  Location: [facility];  Service: Obstetrics;  Laterality: N/A;    OB History     Gravida  2   Para  2   Term  1   Preterm  1   AB      Living  3      SAB      IAB      Ectopic      Multiple  1   Live Births  3            Home Medications    Prior to Admission medications   Medication Sig Start Date End Date Taking? Authorizing Provider  chlorhexidine (HIBICLENS) 4 % external liquid Apply topically daily. 03/11/23  Yes Roniel Halloran, Elita Boone, NP  traMADol (ULTRAM) 50 MG tablet Take 1 tablet (50 mg total) by mouth every 6 (six) hours as needed. 03/11/23  Yes Yola Paradiso R, NP  cephALEXin (KEFLEX) 500 MG capsule Take 1 capsule (500  mg total) by mouth 2 (two) times daily for 7 days. 03/10/23 03/17/23  Sponseller, Eugene Gavia, PA-C  diclofenac (VOLTAREN) 75 MG EC tablet Take by oral route for 30 days. Patient not taking: Reported on 10/25/2022    [provider]  fluconazole (DIFLUCAN) 150 MG tablet TAKE 1 TABLET (150 MG TOTAL) BY MOUTH ONCE FOR 1 DOSE. MAY REPEAT 3 DAYS LATER IF SYMPTOMS PERSIST Patient not taking: Reported on 10/25/2022    [provider]  hydrOXYzine (ATARAX) 25 MG tablet Take 1 tablet (25 mg total) by mouth every 6 (six) hours. Patient not taking: Reported on 10/25/2022 11/22/21   Becky Augusta, NP  meloxicam (MOBIC) 15 MG tablet TAKE 1 TABLET BY MOUTH EVERY DAY WITH MEALS Patient not taking: Reported on 10/25/2022    [provider]  methocarbamol (ROBAXIN) 500 MG tablet Take 1 tablet by mouth as needed. Patient not taking: Reported on 10/25/2022    [provider]  metroNIDAZOLE (METROGEL) 0.75 % vaginal gel PLACE 1 APPLICATORFUL VAGINALLY AT BEDTIME FOR 5 DAYS. Patient not taking: Reported on 10/25/2022 05/17/22   Tereso Newcomer, MD  metroNIDAZOLE (METROGEL)  0.75 % vaginal gel Place 1 Applicatorful vaginally at bedtime. Apply one applicatorful to vagina at bedtime for 5 days Patient not taking: Reported on 02/27/2023 10/31/22   Calvert Cantor, CNM  naproxen (NAPROSYN) 500 MG tablet Take 1 tablet twice a day by oral route. Patient not taking: Reported on 10/25/2022 04/02/22   [provider]  norelgestromin-ethinyl estradiol [employer]) 150-35 MCG/24HR transdermal patch PLACE 1 PATCH ONTO THE SKIN ONCE A WEEK FOR 3 WEEKS LEAVE OFF FOR THE 4TH TO HAVE YOUR PERIOD 04/02/22   Federico Flake, MD  ondansetron (ZOFRAN-ODT) 4 MG disintegrating tablet Take 1 tablet (4 mg total) by mouth every 8 (eight) hours as needed for nausea or vomiting. 02/27/23   Mickie Bail, NP  oxyCODONE-acetaminophen (PERCOCET/ROXICET) 5-325 MG tablet Take 1 tablet by mouth every 6 (six) hours as  needed for severe pain. 03/10/23   Sponseller, Eugene Gavia, PA-C  tinidazole (TINDAMAX) 500 MG tablet Take 4 tablets (2,000 mg total) by mouth daily with breakfast. For two days Patient not taking: Reported on 04/02/2022 01/16/21   Tereso Newcomer, MD    Family History Family History  Problem Relation Age of Onset   Hypertension Mother    Healthy Father    Diabetes Other     Social History Social History   Tobacco Use   Smoking status: Never   Smokeless tobacco: Never  Vaping Use   Vaping status: Never Used  Substance Use Topics   Alcohol use: No   Drug use: Not Currently    Types: Marijuana    Comment: last smoked 2 weeks from August 28th     Allergies   Patient has no known allergies.   Review of Systems Review of Systems   Physical Exam Triage Vital Signs ED Triage Vitals  Encounter Vitals Group     BP 03/11/23 0919 112/72     Systolic BP Percentile --      Diastolic BP Percentile --      Pulse Rate 03/11/23 0919 66     Resp 03/11/23 0919 16     Temp 03/11/23 0919 98.2 F (36.8 C)     Temp Source 03/11/23 0919 Oral     SpO2 03/11/23 0919 99 %     Weight --      Height --      Head Circumference --      Peak Flow --      Pain Score 03/11/23 0918 7     Pain Loc --      Pain Education --      Exclude from Growth Chart --    No data found.  Updated Vital Signs BP 112/72 (BP Location: Left Arm)   Pulse 66   Temp 98.2 F (36.8 C) (Oral)   Resp 16   LMP 02/10/2023   SpO2 99%   Visual Acuity Right Eye Distance:   Left Eye Distance:   Bilateral Distance:    Right Eye Near:   Left Eye Near:    Bilateral Near:     Physical Exam Constitutional:      Appearance: Normal appearance.  Eyes:     Extraocular Movements: Extraocular movements intact.  Pulmonary:     Effort: Pulmonary effort is normal.  Skin:    Comments: 2 cm circular wound present to the anterior of the left knee, epidermal layer has been avulsed, 3 sutures to the center, draining  serosanguineous fluid  Neurological:     Mental Status: She is alert  and oriented to person, place, and time. Mental status is at baseline.      UC Treatments / Results  Labs (all labs ordered are listed, but only abnormal results are displayed) Labs Reviewed - No data to display  EKG   Radiology DG Hand 2 View Left  Result Date: 03/10/2023 CLINICAL DATA:  Fall with anterior pain EXAM: LEFT HAND - 2 VIEW COMPARISON:  None Available. FINDINGS: There is no evidence of fracture or dislocation. There is no evidence of arthropathy or other focal bone abnormality. Soft tissues are unremarkable. IMPRESSION: Negative. Electronically Signed   By: Tiburcio Pea M.D.   On: 03/10/2023 06:40   DG Knee Complete 4 Views Left  Result Date: 03/10/2023 CLINICAL DATA:  Status post fall. EXAM: LEFT KNEE - COMPLETE 4+ VIEW COMPARISON:  None Available. FINDINGS: No evidence of fracture, dislocation, or joint effusion. No evidence of arthropathy or other focal bone abnormality. A small superficial soft tissue laceration is seen along the anterior aspect of the proximal left tibia. IMPRESSION: Small, superficial anterior soft tissue laceration without evidence of an acute osseous abnormality. Electronically Signed   By: Aram Candela M.D.   On: 03/10/2023 04:00    Procedures Procedures (including critical care time)  Medications Ordered in UC Medications - No data to display  Initial Impression / Assessment and Plan / UC Course  I have reviewed the triage vital signs and the nursing notes.  Pertinent labs & imaging results that were available during my care of the patient were reviewed by me and considered in my medical decision making (see chart for details).  Open the wound, left, initial encounter  3 Sutures in place 1 day prior has been removed, on examination sutures are not adhering the wound together and granulation tissue has begun to cover, concerning that it will be enclosed within the  wound as it heals discussed this with patient, cleansed wound with chlorhexidine and applied Steri-Strips, discussed allowing to fall off and not removed, advised daily cleansing using soap water and chlorhexidine which has been sent to pharmacy, may cover with a nonadherent dressing until healed, advised patient to stay active to prevent stiffening of the skin, prescribed tramadol for management of pain as patient is experiencing 10 out of 10 pain at the wound site, pain is felt with all movement and she has become tearful given prior to wound being examined, PDMP reviewed, low risk, advise follow-up with urgent care for any further concerns Final Clinical Impressions(s) / UC Diagnoses   Final diagnoses:  Open knee wound, left, initial encounter     Discharge Instructions      Today your wound on your knee has been evaluated  All sutures have been removed as tissue started to grow over them  Wound has been cleansed here in the office and Steri-Strips applied, do not remove Steri-Strips and allow them to fall off naturally, as they begin to fall off you may trim the sides, even if Steri-Strips look dirty they are protective to the wound, do not remove  Do not allow Steri-Strips to become soaking wet, may become damp during normal hygiene  Cleanse area daily with soap water and Hibiclens, pat and do not rub, may cover with a nonstick dressing or leave open book   Continue antibiotics as prescribed  You may use tramadol every 6 hours as needed for severe pain, please be mindful that this may make you feel sleepy  Probably do not pillows whenever sitting and lying for  support and comfort  Continue activity as tolerated,   may follow-up with urgent care as needed   ED Prescriptions     Medication Sig Dispense Auth. Provider   traMADol (ULTRAM) 50 MG tablet Take 1 tablet (50 mg total) by mouth every 6 (six) hours as needed. 15 tablet Imir Brumbach R, NP   chlorhexidine (HIBICLENS) 4  % external liquid Apply topically daily. 118 mL Densel Kronick, Elita Boone, NP      I have reviewed the PDMP during this encounter.   Valinda Hoar, NP 03/11/23 1306

## 2023-03-17 ENCOUNTER — Ambulatory Visit
Admission: RE | Admit: 2023-03-17 | Discharge: 2023-03-17 | Disposition: A | Discharge status: Home / Self Care | Payer: Medicaid Other | Source: Ambulatory Visit | Attending: Emergency Medicine | Admitting: Emergency Medicine

## 2023-03-17 ENCOUNTER — Other Ambulatory Visit: Payer: Self-pay

## 2023-03-17 VITALS — BP 125/77 | HR 68 | Temp 98.5°F | Resp 16

## 2023-03-17 DIAGNOSIS — S81002D Unspecified open wound, left knee, subsequent encounter: Secondary | ICD-10-CM | POA: Diagnosis not present

## 2023-03-17 MED ORDER — PREDNISONE 20 MG PO TABS: 40.0000 mg | ORAL_TABLET | Freq: Every day | ORAL | 0 refills | Status: DC

## 2023-03-17 MED ORDER — OXYCODONE HCL 5 MG PO TABS
5.0000 mg | ORAL_TABLET | Freq: Four times a day (QID) | ORAL | 0 refills | Status: AC | PRN
Start: 2023-03-17 — End: 2023-03-22

## 2023-03-17 MED ORDER — DOCUSATE SODIUM 50 MG PO CAPS: 50.0000 mg | ORAL_CAPSULE | Freq: Two times a day (BID) | ORAL | 0 refills | Status: DC

## 2023-03-17 MED ORDER — LACTULOSE 10 GM/15ML PO SOLN: 10.0000 g | Freq: Every day | ORAL | 0 refills | Status: DC | PRN

## 2023-03-17 NOTE — ED Provider Notes (Signed)
[name]    CSN: [id number] Arrival date & time: 03/17/23  1122      History   Chief Complaint Chief Complaint  Patient presents with   Wound Check   Appointment    11:15    HPI Kelli Soto is a 27 y.o. female.   Patient presents for reevaluation of left knee wound.  Endorses that she has had persistent pain described as a burning and throbbing sensation radiating from the knee to the lower and upper aspects of the leg.  Has been cleansing daily as directed and keeping covered with dressing but has noticed yellow to green drainage, last dosage of cephalexin today.  Has been taking tramadol for pain but denies improvement in medicine only makes her sleepy.  Endorses constipation with last bowel movement 7 days ago, has been able to tolerate food and liquid, has not attempted treatment denies abdominal pain and has been passing gas.  Past Medical History:  Diagnosis Date   Anemia    Anxiety    Bronchitis    Chlamydia 02/19/2017   Gonorrhea 2019   Neutropenia (HCC)    Panic attack     Patient Active Problem List   Diagnosis Date Noted   Generalized body aches 05/28/2022   Migraine without aura and without status migrainosus, not intractable 12/24/2019   Low iron 02/10/2019   Low serum vitamin D 12/29/2018   Neutropenia (HCC) 12/29/2018    Past Surgical History:  Procedure Laterality Date   CESAREAN SECTION MULTI-GESTATIONAL N/A 06/10/2019   Procedure: CESAREAN SECTION MULTI-GESTATIONAL;  Surgeon: Catalina Antigua, MD;  Location: [facility];  Service: Obstetrics;  Laterality: N/A;    OB History     Gravida  2   Para  2   Term  1   Preterm  1   AB      Living  3      SAB      IAB      Ectopic      Multiple  1   Live Births  3            Home Medications    Prior to Admission medications   Medication Sig Start Date End Date Taking? Authorizing Provider  docusate sodium (COLACE) 50 MG capsule Take 1 capsule (50 mg total) by  mouth 2 (two) times daily. 03/17/23  Yes Marieli Rudy R, NP  lactulose (CHRONULAC) 10 GM/15ML solution Take 15 mLs (10 g total) by mouth daily as needed for mild constipation. 03/17/23  Yes Rigley Niess R, NP  oxyCODONE (ROXICODONE) 5 MG immediate release tablet Take 1 tablet (5 mg total) by mouth every 6 (six) hours as needed for up to 5 days for severe pain. 03/17/23 03/22/23 Yes Zarai Orsborn R, NP  predniSONE (DELTASONE) 20 MG tablet Take 2 tablets (40 mg total) by mouth daily. 03/17/23  Yes Kaylea Mounsey R, NP  cephALEXin (KEFLEX) 500 MG capsule Take 1 capsule (500 mg total) by mouth 2 (two) times daily for 7 days. 03/10/23 03/17/23  Sponseller, Lupe Carney R, PA-C  chlorhexidine (HIBICLENS) 4 % external liquid Apply topically daily. 03/11/23   Valinda Hoar, NP  diclofenac (VOLTAREN) 75 MG EC tablet Take by oral route for 30 days. Patient not taking: Reported on 10/25/2022    [provider]  fluconazole (DIFLUCAN) 150 MG tablet TAKE 1 TABLET (150 MG TOTAL) BY MOUTH ONCE FOR 1 DOSE. MAY REPEAT 3 DAYS LATER IF SYMPTOMS PERSIST Patient not taking: Reported  on 10/25/2022    [provider]  hydrOXYzine (ATARAX) 25 MG tablet Take 1 tablet (25 mg total) by mouth every 6 (six) hours. Patient not taking: Reported on 10/25/2022 11/22/21   Becky Augusta, NP  meloxicam (MOBIC) 15 MG tablet TAKE 1 TABLET BY MOUTH EVERY DAY WITH MEALS Patient not taking: Reported on 10/25/2022    [provider]  methocarbamol (ROBAXIN) 500 MG tablet Take 1 tablet by mouth as needed. Patient not taking: Reported on 10/25/2022    [provider]  metroNIDAZOLE (METROGEL) 0.75 % vaginal gel PLACE 1 APPLICATORFUL VAGINALLY AT BEDTIME FOR 5 DAYS. Patient not taking: Reported on 10/25/2022 05/17/22   Tereso Newcomer, MD  metroNIDAZOLE (METROGEL) 0.75 % vaginal gel Place 1 Applicatorful vaginally at bedtime. Apply one applicatorful to vagina at bedtime for 5 days Patient not taking: Reported on  02/27/2023 10/31/22   Calvert Cantor, CNM  naproxen (NAPROSYN) 500 MG tablet Take 1 tablet twice a day by oral route. Patient not taking: Reported on 10/25/2022 04/02/22   [provider]  norelgestromin-ethinyl estradiol [employer]) 150-35 MCG/24HR transdermal patch PLACE 1 PATCH ONTO THE SKIN ONCE A WEEK FOR 3 WEEKS LEAVE OFF FOR THE 4TH TO HAVE YOUR PERIOD 04/02/22   Federico Flake, MD  ondansetron (ZOFRAN-ODT) 4 MG disintegrating tablet Take 1 tablet (4 mg total) by mouth every 8 (eight) hours as needed for nausea or vomiting. 02/27/23   Mickie Bail, NP  oxyCODONE-acetaminophen (PERCOCET/ROXICET) 5-325 MG tablet Take 1 tablet by mouth every 6 (six) hours as needed for severe pain. Patient not taking: Reported on 03/17/2023 03/10/23   Sponseller, Eugene Gavia, PA-C  tinidazole (TINDAMAX) 500 MG tablet Take 4 tablets (2,000 mg total) by mouth daily with breakfast. For two days Patient not taking: Reported on 04/02/2022 01/16/21   Anyanwu, Jethro Bastos, MD  traMADol (ULTRAM) 50 MG tablet Take 1 tablet (50 mg total) by mouth every 6 (six) hours as needed. 03/11/23   Valinda Hoar, NP    Family History Family History  Problem Relation Age of Onset   Hypertension Mother    Healthy Father    Diabetes Other     Social History Social History   Tobacco Use   Smoking status: Some Days    Types: Cigars   Smokeless tobacco: Never  Vaping Use   Vaping status: Never Used  Substance Use Topics   Alcohol use: No   Drug use: Not Currently    Types: Marijuana    Comment: last smoked 2 weeks from August 28th     Allergies   Patient has no known allergies.   Review of Systems Review of Systems   Physical Exam Triage Vital Signs ED Triage Vitals  Encounter Vitals Group     BP 03/17/23 1130 125/77     Systolic BP Percentile --      Diastolic BP Percentile --      Pulse Rate 03/17/23 1130 68     Resp 03/17/23 1130 16     Temp 03/17/23 1130 98.5 F (36.9 C)     Temp Source  03/17/23 1130 Oral     SpO2 03/17/23 1130 99 %     Weight --      Height --      Head Circumference --      Peak Flow --      Pain Score 03/17/23 1133 7     Pain Loc --      Pain Education --  Exclude from Growth Chart --    No data found.  Updated Vital Signs BP 125/77 (BP Location: Left Arm)   Pulse 68   Temp 98.5 F (36.9 C) (Oral)   Resp 16   LMP 03/16/2023 (Exact Date)   SpO2 99%   Visual Acuity Right Eye Distance:   Left Eye Distance:   Bilateral Distance:    Right Eye Near:   Left Eye Near:    Bilateral Near:     Physical Exam Constitutional:      Appearance: Normal appearance.  Eyes:     Extraocular Movements: Extraocular movements intact.  Pulmonary:     Effort: Pulmonary effort is normal.  Abdominal:     General: Abdomen is flat. Bowel sounds are normal. There is no distension.     Palpations: Abdomen is soft.     Tenderness: There is no abdominal tenderness. There is no guarding.  Skin:    Comments: Steri-Strips in place with green-yellow drainage over wound, tender to palpation, able to complete full range of motion, 2+ popliteal pulse  Neurological:     Mental Status: She is alert and oriented to person, place, and time. Mental status is at baseline.      UC Treatments / Results  Labs (all labs ordered are listed, but only abnormal results are displayed) Labs Reviewed  AEROBIC/ANAEROBIC CULTURE W GRAM STAIN (SURGICAL/DEEP WOUND)    EKG   Radiology No results found.  Procedures Procedures (including critical care time)  Medications Ordered in UC Medications - No data to display  Initial Impression / Assessment and Plan / UC Course  I have reviewed the triage vital signs and the nursing notes.  Pertinent labs & imaging results that were available during my care of the patient were reviewed by me and considered in my medical decision making (see chart for details).  Open wound of left knee, subsequent encounter,  constipation  Vital signs stable, low suspicion for sepsis, completing antibiotic course today, wound culture obtained, will change antibiotic based on results, experiencing constipation, prescribed lactulose for treatment after completion of the bowel movement patient to begin use of Colace, prednisone prescribed for pain as well as oxycodone, patient to not use narcotic until bowel movement has occurred, no signs of obstruction on exam, if unable to have bowel movement with use of lactulose patient is to go to the nearest emergency department for management, verbalized understanding, given walking referral to the wound center for further management Final Clinical Impressions(s) / UC Diagnoses   Final diagnoses:  Open wound of left knee, subsequent encounter     Discharge Instructions      Today you are evaluated for your wound  Sample of your wound has been obtained and sent to the lab to determine if further bacteria is there, typically takes about 2 to 3 days, if we see additional bacteria we will place antibiotic choice off of this, you will be notified of results via telephone  For constipation begin lactulose every morning for up to 4 days until full bowel movement has occurred, after full bowel movement please discontinue medicine, while using medicine take stool softener twice daily to prevent reoccurrence, ensure you are drinking lots of fluid to maintain hydration  Lactulose will cause urgency and diarrhea therefore he ensure that you are near and have access to the restroom  If after use of 4 days if you still not had a bowel movement you will need to go to the nearest emergency department for  management of constipation  Begin prednisone every morning with food for 5 days to reduce inflammation and help manage pain, for severe pain you may use oxycodone, with hold off on use until you have had a bowel movement as this can make you further constipated, may use every 6 hours as  needed, this medicine can also make you feel sleepy, may take Tylenol in addition to both of these medicines  Continue wound care as directed until seen by wound care center  Please schedule follow-up appointment with the wound care center, information is listed on front page for further management   ED Prescriptions     Medication Sig Dispense Auth. Provider   lactulose (CHRONULAC) 10 GM/15ML solution Take 15 mLs (10 g total) by mouth daily as needed for mild constipation. 236 mL Kayton Dunaj R, NP   docusate sodium (COLACE) 50 MG capsule Take 1 capsule (50 mg total) by mouth 2 (two) times daily. 10 capsule Detric Scalisi R, NP   predniSONE (DELTASONE) 20 MG tablet Take 2 tablets (40 mg total) by mouth daily. 10 tablet Haywood Meinders R, NP   oxyCODONE (ROXICODONE) 5 MG immediate release tablet Take 1 tablet (5 mg total) by mouth every 6 (six) hours as needed for up to 5 days for severe pain. 20 tablet Valinda Hoar, NP      PDMP not reviewed this encounter.   Valinda Hoar, [place] 03/17/23 1614

## 2023-03-17 NOTE — ED Triage Notes (Signed)
Pain meds not helping with pain, just makes her sleepy.  Seen 9/22 and seen 9/23 for the leg.  Steri strips intact.  Dried drainage on strips  Patient is constipated.  Patient reports a week since last bm.

## 2023-03-17 NOTE — Discharge Instructions (Addendum)
Today you are evaluated for your wound  Sample of your wound has been obtained and sent to the lab to determine if further bacteria is there, typically takes about 2 to 3 days, if we see additional bacteria we will place antibiotic choice off of this, you will be notified of results via telephone  For constipation begin lactulose every morning for up to 4 days until full bowel movement has occurred, after full bowel movement please discontinue medicine, while using medicine take stool softener twice daily to prevent reoccurrence, ensure you are drinking lots of fluid to maintain hydration  Lactulose will cause urgency and diarrhea therefore he ensure that you are near and have access to the restroom  If after use of 4 days if you still not had a bowel movement you will need to go to the nearest emergency department for management of constipation  Begin prednisone every morning with food for 5 days to reduce inflammation and help manage pain, for severe pain you may use oxycodone, with hold off on use until you have had a bowel movement as this can make you further constipated, may use every 6 hours as needed, this medicine can also make you feel sleepy, may take Tylenol in addition to both of these medicines  Continue wound care as directed until seen by wound care center  Please schedule follow-up appointment with the wound care center, information is listed on front page for further management

## 2023-03-20 LAB — AEROBIC CULTURE W GRAM STAIN (SUPERFICIAL SPECIMEN)

## 2023-03-27 ENCOUNTER — Encounter: Payer: Medicaid Other | Attending: Internal Medicine | Admitting: Internal Medicine

## 2023-03-27 DIAGNOSIS — F17200 Nicotine dependence, unspecified, uncomplicated: Secondary | ICD-10-CM | POA: Insufficient documentation

## 2023-03-27 DIAGNOSIS — L97828 Non-pressure chronic ulcer of other part of left lower leg with other specified severity: Secondary | ICD-10-CM | POA: Insufficient documentation

## 2023-03-27 DIAGNOSIS — W19XXXD Unspecified fall, subsequent encounter: Secondary | ICD-10-CM | POA: Insufficient documentation

## 2023-03-27 DIAGNOSIS — S81012D Laceration without foreign body, left knee, subsequent encounter: Secondary | ICD-10-CM | POA: Insufficient documentation

## 2023-04-02 ENCOUNTER — Ambulatory Visit
Admission: EM | Admit: 2023-04-02 | Discharge: 2023-04-02 | Disposition: A | Discharge status: Home / Self Care | Payer: Medicaid Other | Attending: Emergency Medicine | Admitting: Emergency Medicine

## 2023-04-02 DIAGNOSIS — Z5189 Encounter for other specified aftercare: Secondary | ICD-10-CM

## 2023-04-02 DIAGNOSIS — M25562 Pain in left knee: Secondary | ICD-10-CM

## 2023-04-02 MED ORDER — NAPROXEN 500 MG PO TABS: 500.0000 mg | ORAL_TABLET | Freq: Two times a day (BID) | ORAL | 0 refills | Status: AC

## 2023-04-02 MED ORDER — TRIPLE ANTIBIOTIC 3.5-400-5000 EX OINT
1.0000 | TOPICAL_OINTMENT | Freq: Once | CUTANEOUS | Status: AC
  Administered 2023-04-02: 1 via CUTANEOUS

## 2023-04-02 NOTE — ED Triage Notes (Signed)
Patient presents to UC for wound check on left knee. Has wound appt tomorrow. States she has been having sharp shooting pain, fluid build-up in her knee. States the wound clinic could not give her pain meds, states she was seen in UC and oxy prescribed helped with pain.

## 2023-04-02 NOTE — ED Provider Notes (Signed)
[name]    CSN: [id number] Arrival date & time: 04/02/23  0845      History   Chief Complaint Chief Complaint  Patient presents with   Wound Check    HPI Kelli Soto is a 27 y.o. female.   27 year old female, Kelli Soto, presents to urgent care for evaluation of left knee wound check that original was seen on 03/10/23 at ER. Pt has since been evaluated in urgent care x 3(9/23, 9/29, and 10/15). Pt has also been evaluated at [place] [facility], Patient states she has a wound appointment tomorrow with her clinic however they will not give her pain medication; last time she was seen at this urgent care was given oxycodone which helped with pain, states she is out of pain meds. Pt requesting refill of pain med.  Patient reports sharp shooting pain, burning area to left leg, and wound/? fluid buildup in her left knee. Pt has not removed dressing.Pt ambulates well.  The history is provided by the patient. No language interpreter was used.    Past Medical History:  Diagnosis Date   Anemia    Anxiety    Bronchitis    Chlamydia 02/19/2017   Gonorrhea 2019   Neutropenia (HCC)    Panic attack     Patient Active Problem List   Diagnosis Date Noted   Visit for wound check 04/02/2023   Acute pain of left knee 04/02/2023   Generalized body aches 05/28/2022   Migraine without aura and without status migrainosus, not intractable 12/24/2019   Low iron 02/10/2019   Low serum vitamin D 12/29/2018   Neutropenia (HCC) 12/29/2018    Past Surgical History:  Procedure Laterality Date   CESAREAN SECTION MULTI-GESTATIONAL N/A 06/10/2019   Procedure: CESAREAN SECTION MULTI-GESTATIONAL;  Surgeon: Catalina Antigua, MD;  Location: [facility] ORS;  Service: Obstetrics;  Laterality: N/A;    OB History     Gravida  2   Para  2   Term  1   Preterm  1   AB      Living  3      SAB      IAB      Ectopic      Multiple  1   Live Births  3             Home Medications    Prior to Admission medications   Medication Sig Start Date End Date Taking? Authorizing Provider  chlorhexidine (HIBICLENS) 4 % external liquid Apply topically daily. 03/11/23   White, Elita Boone, NP  docusate sodium (COLACE) 50 MG capsule Take 1 capsule (50 mg total) by mouth 2 (two) times daily. 03/17/23   White, Elita Boone, NP  fluconazole (DIFLUCAN) 150 MG tablet TAKE 1 TABLET (150 MG TOTAL) BY MOUTH ONCE FOR 1 DOSE. MAY REPEAT 3 DAYS LATER IF SYMPTOMS PERSIST Patient not taking: Reported on 10/25/2022    [provider]  hydrOXYzine (ATARAX) 25 MG tablet Take 1 tablet (25 mg total) by mouth every 6 (six) hours. Patient not taking: Reported on 10/25/2022 11/22/21   Becky Augusta, NP  lactulose (CHRONULAC) 10 GM/15ML solution Take 15 mLs (10 g total) by mouth daily as needed for mild constipation. 03/17/23   White, Elita Boone, NP  methocarbamol (ROBAXIN) 500 MG tablet Take 1 tablet by mouth as needed. Patient not taking: Reported on 10/25/2022    [provider]  metroNIDAZOLE (METROGEL) 0.75 % vaginal gel PLACE 1 APPLICATORFUL VAGINALLY AT BEDTIME FOR  5 DAYS. Patient not taking: Reported on 10/25/2022 05/17/22   Tereso Newcomer, MD  metroNIDAZOLE (METROGEL) 0.75 % vaginal gel Place 1 Applicatorful vaginally at bedtime. Apply one applicatorful to vagina at bedtime for 5 days Patient not taking: Reported on 02/27/2023 10/31/22   Calvert Cantor, CNM  naproxen (NAPROSYN) 500 MG tablet Take 1 tablet (500 mg total) by mouth 2 (two) times daily with a meal for 5 days. 04/02/23 04/07/23  Dashonda Bonneau, Para March, NP  norelgestromin-ethinyl estradiol [facility]) 150-35 MCG/24HR transdermal patch PLACE 1 PATCH ONTO THE SKIN ONCE A WEEK FOR 3 WEEKS LEAVE OFF FOR THE 4TH TO HAVE YOUR PERIOD 04/02/22   Federico Flake, MD  ondansetron (ZOFRAN-ODT) 4 MG disintegrating tablet Take 1 tablet (4 mg total) by mouth every 8 (eight) hours as needed for nausea or vomiting.  02/27/23   Mickie Bail, NP  oxyCODONE-acetaminophen (PERCOCET/ROXICET) 5-325 MG tablet Take 1 tablet by mouth every 6 (six) hours as needed for severe pain. Patient not taking: Reported on 03/17/2023 03/10/23   Sponseller, Eugene Gavia, PA-C  predniSONE (DELTASONE) 20 MG tablet Take 2 tablets (40 mg total) by mouth daily. 03/17/23   Valinda Hoar, NP  tinidazole (TINDAMAX) 500 MG tablet Take 4 tablets (2,000 mg total) by mouth daily with breakfast. For two days Patient not taking: Reported on 04/02/2022 01/16/21   Anyanwu, Jethro Bastos, MD  traMADol (ULTRAM) 50 MG tablet Take 1 tablet (50 mg total) by mouth every 6 (six) hours as needed. 03/11/23   Valinda Hoar, NP    Family History Family History  Problem Relation Age of Onset   Hypertension Mother    Healthy Father    Diabetes Other     Social History Social History   Tobacco Use   Smoking status: Some Days    Types: Cigars   Smokeless tobacco: Never  Vaping Use   Vaping status: Never Used  Substance Use Topics   Alcohol use: No   Drug use: Not Currently    Types: Marijuana    Comment: last smoked 2 weeks from August 28th     Allergies   Patient has no known allergies.   Review of Systems Review of Systems  Constitutional:  Negative for fever.  Musculoskeletal:  Positive for arthralgias.  Skin:  Positive for wound.  All other systems reviewed and are negative.    Physical Exam Triage Vital Signs ED Triage Vitals  Encounter Vitals Group     BP 04/02/23 0903 114/76     Systolic BP Percentile --      Diastolic BP Percentile --      Pulse Rate 04/02/23 0903 61     Resp 04/02/23 0903 18     Temp 04/02/23 0903 97.7 F (36.5 C)     Temp src --      SpO2 04/02/23 0903 98 %     Weight --      Height --      Head Circumference --      Peak Flow --      Pain Score 04/02/23 0856 7     Pain Loc --      Pain Education --      Exclude from Growth Chart --    No data found.  Updated Vital Signs BP 114/76    Pulse 61   Temp 97.7 F (36.5 C)   Resp 18   LMP 03/16/2023 (Exact Date)   SpO2 98%   Visual Acuity Right Eye  Distance:   Left Eye Distance:   Bilateral Distance:    Right Eye Near:   Left Eye Near:    Bilateral Near:     Physical Exam Vitals and nursing note reviewed.  Musculoskeletal:     Left knee: Tenderness present.       Legs:      UC Treatments / Results  Labs (all labs ordered are listed, but only abnormal results are displayed) Labs Reviewed - No data to display  EKG   Radiology No results found.  Procedures Procedures (including critical care time)  Medications Ordered in UC Medications  neomycin-bacitracin-polymyxin [phone] OINT 1 Application (1 Application Apply externally Given 04/02/23 0940)    Initial Impression / Assessment and Plan / UC Course  I have reviewed the triage vital signs and the nursing notes.  Pertinent labs & imaging results that were available during my care of the patient were reviewed by me and considered in my medical decision making (see chart for details).  Clinical Course as of 04/02/23 0944  Tue Apr 02, 2023  9562 Triple antibiotic ointment and nonstick dressing applied to wound, pt aware unable to refill narcotics, recommend tylenol or naproxen(scripted), pt to keep and follow up with wound clinic tomorrow for recheck. Pt verbalized understanding to this provider. [JD]  [phone] Pt advised Rn she has 4 pain pills left, recommend naproxen alternating. [JD]    Clinical Course User Index [JD] Nai Borromeo, Para March, NP    Ddx: Wound check, left knee pain Final Clinical Impressions(s) / UC Diagnoses   Final diagnoses:  Visit for wound check  Acute pain of left knee     Discharge Instructions      May take tylenol as label directed for pain, or naproxen as prescribed. Keep wound appt as scheduled tomorrow.      ED Prescriptions     Medication Sig Dispense Auth. Provider   naproxen (NAPROSYN) 500 MG tablet  Take 1 tablet (500 mg total) by mouth 2 (two) times daily with a meal for 5 days. 10 tablet Chavis Tessler, Para March, NP      PDMP not reviewed this encounter.   Clancy Gourd, NP 04/02/23 [phone]

## 2023-04-02 NOTE — Discharge Instructions (Addendum)
May take tylenol as label directed for pain, or naproxen as prescribed. Keep wound appt as scheduled tomorrow.

## 2023-04-03 ENCOUNTER — Ambulatory Visit: Payer: Medicaid Other | Admitting: Internal Medicine

## 2023-04-04 ENCOUNTER — Ambulatory Visit: Payer: Medicaid Other | Admitting: Physician Assistant

## 2023-04-15 NOTE — Progress Notes (Signed)
KERSTAN, BUELOW A ([id number]) [id number] Page 1 of 6 Visit Report for 03/27/2023 Chief Complaint Document Details Patient Name: Date of Service: [name] 03/27/2023 1:30 PM Medical Record Number: [id number] Patient Account Number: [id number] Date of Birth/Sex: Treating RN: 08/11/1995 (27 y.o. Freddy Finner Primary Care Provider: PA Zenovia Jordan, NO Other Clinician: Referring Provider: Treating Provider/Extender: RO BSO N, MICHA EL Willodean Rosenthal, Adrienne Weeks in Treatment: 0 Information Obtained from: Patient Chief Complaint 10/69/24; patient is here for review of wound which was initially traumatic just below the tibial tuberosity on the left Electronic Signature(s) Signed: 03/27/2023 4:50:17 PM By: Baltazar Najjar MD Entered By: Baltazar Najjar on 03/27/2023 11:49:59 -------------------------------------------------------------------------------- HPI Details Patient Name: Date of Service: [name], Kelli Bal A. 03/27/2023 1:30 PM Medical Record Number: [id number] Patient Account Number: [id number] Date of Birth/Sex: Treating RN: 03-26-1996 (27 y.o. Freddy Finner Primary Care Provider: PA Zenovia Jordan, NO Other Clinician: Referring Provider: Treating Provider/Extender: RO BSO N, MICHA EL Willodean Rosenthal, Adrienne Weeks in Treatment: 0 History of Present Illness HPI Description: This is an otherwise healthy 27 year old woman who was leaving a bar on 9/22 she had a fall, her car was parked in rocks. She developed a deep laceration of the left knee just below the tibial tuberosity. 3 sutures were buried in the anterior of the wound. An x-ray did not show foreign body osseous destruction. She was given a course of cephalexin. She was seen in urgent care the next day the sutures were removed the wound was washed with chlorhexidine and Steri-Stripped. She was seen again in the ER on 9/29 a wound culture was obtained that was negative. She was having yellowish to green drainage she  was given a course of prednisone although I am not really sure why. Her culture was ultimately negative. Electronic Signature(s) Signed: 03/27/2023 4:50:17 PM By: Baltazar Najjar MD Entered By: Baltazar Najjar on 03/27/2023 11:53:12 Kelli Soto ([id number]) [id number] Page 2 of 6 -------------------------------------------------------------------------------- Physical Exam Details Patient Name: Date of Service: REA Kelli Barker A. 03/27/2023 1:30 PM Medical Record Number: [id number] Patient Account Number: [id number] Date of Birth/Sex: Treating RN: [birth year] (27 y.o. Freddy Finner Primary Care Provider: PA Zenovia Jordan, NO Other Clinician: Referring Provider: Treating Provider/Extender: RO BSO N, MICHA EL Willodean Rosenthal, Adrienne Weeks in Treatment: 0 Constitutional Sitting or standing Blood Pressure is within target range for patient.. Pulse regular and within target range for patient.Marland Kitchen Respirations regular, non-labored and within target range.. Temperature is normal and within the target range for the patient.Marland Kitchen appears in no distress. Cardiovascular Pedal pulses are palpable.. Musculoskeletal This wound does not involve the left knee there is no swelling. Flexion extension normal. Notes Wound exam; the wound is actually a circular wound just below the tibial tuberosity. This has some probing depth in the center but not a lot there is no purulent drainage. Probably about 40 to 50% surface area of this is covered with adherent slough. I attempted to debride this but she simply could not stand it. There is no evidence of cellulitis around the wound no evidence that this wound involves deeper structures. Comparing this with the picture from her initial ER visit things have closed and quite nicely Electronic Signature(s) Signed: 03/27/2023 4:50:17 PM By: Baltazar Najjar MD Entered By: Baltazar Najjar on 03/27/2023  11:55:21 -------------------------------------------------------------------------------- Physician Orders Details Patient Name: Date of Service: [name], Kelli Bal A. 03/27/2023 1:30 PM Medical Record Number: [id number] Patient Account Number: [id number] Date of Birth/Sex: Treating RN: August 08, 1995 (27  y.o. Freddy Finner Primary Care Provider: PA Zenovia Jordan, NO Other Clinician: Referring Provider: Treating Provider/Extender: RO BSO N, MICHA EL Willodean Rosenthal, Adrienne Weeks in Treatment: 0 Verbal / Phone Orders: No Diagnosis Coding Follow-up Appointments Return Appointment in 1 week. Bathing/ Shower/ Hygiene May shower; gently cleanse wound with antibacterial soap, rinse and pat dry prior to dressing wounds Anesthetic (Use 'Patient Medications' Section for Anesthetic Order Entry) Lidocaine applied to wound bed Corporan, Carrissa A ([id number]) [id number] Page 3 of 6 Edema Control - Lymphedema / Segmental Compressive Device / Other Elevate, Exercise Daily and A void Standing for Long Periods of Time. Elevate legs to the level of the heart and pump ankles as often as possible Elevate leg(s) parallel to the floor when sitting. Wound Treatment Wound #1 - Knee Wound Laterality: Left Cleanser: Soap and Water Discharge Instructions: Gently cleanse wound with antibacterial soap, rinse and pat dry prior to dressing wounds Prim Dressing: Hydrofera Blue Ready Transfer Foam, 2.5x2.5 (in/in) ary Discharge Instructions: Apply Hydrofera Blue Ready to wound bed as directed Secondary Dressing: Gauze Discharge Instructions: As directed: dry, moistened with saline or moistened with Dakins Solution Secondary Dressing: Kerlix 4.5 x 4.1 (in/yd) Discharge Instructions: Apply Kerlix 4.5 x 4.1 (in/yd) as instructed Secured With: Medipore T - 84M Medipore H Soft Cloth Surgical T ape ape, 2x2 (in/yd) Psychologist, prison and probation services) Signed: 03/27/2023 4:50:17 PM By: Baltazar Najjar MD Signed: 04/15/2023  8:01:26 AM By: Yevonne Pax RN Entered By: Yevonne Pax on 03/27/2023 11:23:39 -------------------------------------------------------------------------------- Problem List Details Patient Name: Date of Service: [name], Kelli Bal A. 03/27/2023 1:30 PM Medical Record Number: [id number] Patient Account Number: [id number] Date of Birth/Sex: Treating RN: 01-05-1996 (27 y.o. 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 03/27/2023 1:30 PM Medical Record Number: [id number] Patient Account Number: [id number] Date of Birth/Sex: Treating RN: 11/26/1995 (27 y.o. Freddy Finner Primary Care Provider: PA TIENT, NO Other Clinician: Referring Provider: Treating Provider/Extender: RO BSO N, MICHA EL Willodean Rosenthal, Adrienne Weeks in Treatment: 0 Subjective Chief Complaint Information obtained from Patient 10/69/24; patient is here for review of wound which was initially traumatic just below the tibial tuberosity on the left History of Present Illness (HPI) This is an otherwise healthy 27 year old woman who was leaving a bar on 9/22 she had a fall, her car was parked in rocks. She developed a deep  laceration of the left knee just below the tibial tuberosity. 3 sutures were buried in the anterior of the wound. An x-ray did not show foreign body osseous destruction. She was given a course of cephalexin. She was seen in urgent care the next day the sutures were removed the wound was washed with chlorhexidine and Steri- Stripped. She was seen again in the ER on 9/29 a wound culture was obtained that was negative. She was having yellowish to green drainage she was given a course of prednisone although I am not really sure  why. Her culture was ultimately negative. Patient History Allergies No Known Allergies Social History Current every day smoker, Marital Status - Single, Alcohol Use - Moderate, Drug Use - No History, Caffeine Use - Daily. Review of Systems (ROS) Integumentary (Skin) Complains or has symptoms of Wounds, Swelling. Objective Constitutional Sitting or standing Blood Pressure is within target range for patient.. Pulse regular and within target range for patient.Marland Kitchen Respirations regular, non-labored and within target range.. Temperature is normal and within the target range for the patient.Marland Kitchen appears in no distress. Vitals Time Taken: 1:36 PM, Height: 64 in, Source: Stated, Weight: 170 lbs, Source: Stated, BMI: 29.2, Temperature: 98 F, Pulse: 62 bpm, Respiratory Rate: 18 breaths/min, Blood Pressure: 110/59 mmHg. Cardiovascular Pedal pulses are palpable.. Musculoskeletal This wound does not involve the left knee there is no swelling. Flexion extension normal. General Notes: Wound exam; the wound is actually a circular wound just below the tibial tuberosity. This has some probing depth in the center but not a lot there is no purulent drainage. Probably about 40 to 50% surface area of this is covered with adherent slough. I attempted to debride this but she simply could not stand it. There is no evidence of cellulitis around the wound no evidence that this wound involves deeper  structures. Comparing this with the picture from her initial ER visit things have closed and quite nicely Integumentary (Hair, Skin) Wound #1 status is Open. Original cause of wound was Trauma. The date acquired was: 03/10/2023. The wound is located on the Left Knee. The wound measures 2.5cm length x 3cm width x 0.5cm depth; 5.89cm^2 area and 2.945cm^3 volume. There is Fat Layer (Subcutaneous Tissue) exposed. There is no tunneling or undermining noted. There is a medium amount of serosanguineous drainage noted. There is medium (34-66%) red granulation within the wound bed. There is a medium (34-66%) amount of necrotic tissue within the wound bed including Adherent Slough. BRAD, FALARDEAU A ([id number]) [id number] Page 5 of 6 Assessment Active Problems ICD-10 Laceration without foreign body, left knee, subsequent encounter Non-pressure chronic ulcer of other part of left lower leg with other specified severity Plan Follow-up Appointments: Return Appointment in 1 week. Bathing/ Shower/ Hygiene: May shower; gently cleanse wound with antibacterial soap, rinse and pat dry prior to dressing wounds Anesthetic (Use 'Patient Medications' Section for Anesthetic Order Entry): Lidocaine applied to wound bed Edema Control - Lymphedema / Segmental Compressive Device / Other: Elevate, Exercise Daily and Avoid Standing for Long Periods of Time. Elevate legs to the level of the heart and pump ankles as often as possible Elevate leg(s) parallel to the floor when sitting. WOUND #1: - Knee Wound Laterality: Left Cleanser: Soap and Water Discharge Instructions: Gently cleanse wound with antibacterial soap, rinse and pat dry prior to dressing wounds Prim Dressing: Hydrofera Blue Ready Transfer Foam, 2.5x2.5 (in/in) ary Discharge Instructions: Apply Hydrofera Blue Ready to wound bed as directed Secondary Dressing: Gauze Discharge Instructions: As directed: dry, moistened with saline  or moistened with Dakins Solution Secondary Dressing: Kerlix 4.5 x 4.1 (in/yd) Discharge Instructions: Apply Kerlix 4.5 x 4.1 (in/yd) as instructed Secured With: Medipore T - 26M Medipore H Soft Cloth Surgical T ape ape, 2x2 (in/yd) 1. We are going to use Hydrofera Blue under kerlix Coban. She can wash this off and change the dressing daily. It will be secured with kerlix and gauze. 2. There is no evidence that this is a worrisome wound., It seems to have filled in quite nicely since her initial trip to the  ER. 3. She complains of a lot of pain however there really does not look like to be any infection I did not do any cultures. I do not think antibiotics are indicated 4. The Hydrofera Blue is antibacterial should help with some drainage. It also will help with some debridement properties. She has epithelialization we will need to keep a eye on this when we see her next week Electronic Signature(s) Signed: 03/27/2023 4:50:17 PM By: Baltazar Najjar MD Entered By: Baltazar Najjar on 03/27/2023 11:57:07 -------------------------------------------------------------------------------- ROS/PFSH Details Patient Name: Date of Service: [name], Kelli Bal A. 03/27/2023 1:30 PM Medical Record Number: [id number] Patient Account Number: [id number] Date of Birth/Sex: Treating RN: 1996-03-19 (27 y.o. Freddy Finner Primary Care Provider: PA Zenovia Jordan, NO Other Clinician: Referring Provider: Treating Provider/Extender: RO BSO N, MICHA EL Willodean Rosenthal, Adrienne Weeks in Treatment: 0 Integumentary (Skin) Complaints and Symptoms: Positive for: Wounds; Swelling Mckercher, Chyan A ([id number]) [id number] Page 6 of 6 Immunizations Pneumococcal Vaccine: Received Pneumococcal Vaccination: No Implantable Devices None Family and Social History Current every day smoker; Marital Status - Single; Alcohol Use: Moderate; Drug Use: No History; Caffeine Use: Daily Electronic Signature(s) Signed:  03/27/2023 4:50:17 PM By: Baltazar Najjar MD Signed: 04/15/2023 8:01:26 AM By: Yevonne Pax RN Entered By: Yevonne Pax on 03/27/2023 10:38:37 -------------------------------------------------------------------------------- SuperBill Details Patient Name: Date of Service: [name], Ralph Leyden. 03/27/2023 Medical Record Number: [id number] Patient Account Number: [id number] Date of Birth/Sex: Treating RN: July 30, 1995 (27 y.o. Freddy Finner Primary Care Provider: PA Zenovia Jordan, NO Other Clinician: Referring Provider: Treating Provider/Extender: RO BSO N, MICHA EL Willodean Rosenthal, Adrienne Weeks in Treatment: 0 Diagnosis Coding ICD-10 Codes Code Description S81.012D Laceration without foreign body, left knee, subsequent encounter L97.828 Non-pressure chronic ulcer of other part of left lower leg with other specified severity Facility Procedures : CPT4 Code: [id number] Description: 99213 - WOUND CARE VISIT-LEV 3 EST PT Modifier: Quantity: 1 Physician Procedures : CPT4 Code Description Modifier [id number] WC PHYS LEVEL 3 NEW PT ICD-10 Diagnosis Description S81.012D Laceration without foreign body, left knee, subsequent encounter L97.828 Non-pressure chronic ulcer of other part of left lower leg with other specified  severity Quantity: 1 Electronic Signature(s) Signed: 03/27/2023 4:50:17 PM By: Baltazar Najjar MD Previous Signature: 03/27/2023 2:40:04 PM Version By: Yevonne Pax RN Entered By: Baltazar Najjar on 03/27/2023 11:57:39

## 2023-04-15 NOTE — Progress Notes (Signed)
TAMMI, HARTVIGSEN A ([id number]) [id number] Page 1 of 5 Visit Report for 03/27/2023 Abuse Risk Screen Details Patient Name: Date of Service: Kelli Kelli Barker A. 03/27/2023 1:30 PM Medical Record Number: [id number] Patient Account Number: [id number] Date of Birth/Sex: Treating RN: 03/04/1996 (27 y.o. Freddy Finner Primary Care Leinaala Catanese: PA Zenovia Jordan, NO Other Clinician: Referring Mattisyn Cardona: Treating Kienan Doublin/Extender: RO BSO N, MICHA EL Willodean Rosenthal, Adrienne Weeks in Treatment: 0 Abuse Risk Screen Items Answer ABUSE RISK SCREEN: Has anyone close to you tried to hurt or harm you recentlyo No Do you feel uncomfortable with anyone in your familyo No Has anyone forced you do things that you didnt want to doo No Electronic Signature(s) Signed: 04/15/2023 8:01:26 AM By: Yevonne Pax RN Entered By: Yevonne Pax on 03/27/2023 10:38:44 -------------------------------------------------------------------------------- Activities of Daily Living Details Patient Name: Date of Service: Kelli Kelli Barker A. 03/27/2023 1:30 PM Medical Record Number: [id number] Patient Account Number: [id number] Date of Birth/Sex: Treating RN: 10-Jun-1996 (27 y.o. Freddy Finner Primary Care Juandavid Dallman: PA Zenovia Jordan, NO Other Clinician: Referring Modestine Scherzinger: Treating Geraldina Parrott/Extender: RO BSO N, MICHA EL Willodean Rosenthal, Adrienne Weeks in Treatment: 0 Activities of Daily Living Items Answer Activities of Daily Living (Please select one for each item) Drive Automobile Completely Able T Medications ake Completely Able Use T elephone Completely Able Care for Appearance Completely Able Use T oilet Completely Able Bath / Shower Completely Able Dress Self Completely Able Feed Self Completely Able Walk Completely Able Get In / Out Bed Completely Able Housework Completely Kelli Soto, Kelli A ([id number]) [id number] [id number] Page 2 of 5 Prepare Meals Completely Able Handle Money  Completely Able Shop for Self Completely Able Electronic Signature(s) Signed: 04/15/2023 8:01:26 AM By: Yevonne Pax RN Entered By: Yevonne Pax on 03/27/2023 10:39:10 -------------------------------------------------------------------------------- Education Screening Details Patient Name: Date of Service: Kelli Soto, Kelli Bal A. 03/27/2023 1:30 PM Medical Record Number: [id number] Patient Account Number: [id number] Date of Birth/Sex: Treating RN: 07/18/1995 (27 y.o. Freddy Finner Primary Care Daija Routson: PA Zenovia Jordan, NO Other Clinician: Referring Breton Berns: Treating Towanda Hornstein/Extender: RO BSO N, MICHA EL Apolonio Schneiders Weeks in Treatment: 0 Primary Learner Assessed: Patient Learning Preferences/Education Level/Primary Language Learning Preference: Explanation Highest Education Level: High School Preferred Language: Economist Language Barrier: No Translator Needed: No Memory Deficit: No Emotional Barrier: No Cultural/Religious Beliefs Affecting Medical Care: No Physical Barrier Impaired Vision: No Impaired Hearing: No Decreased Hand dexterity: No Knowledge/Comprehension Knowledge Level: Medium Comprehension Level: High Ability to understand written instructions: High Ability to understand verbal instructions: High Motivation Anxiety Level: Anxious Cooperation: Cooperative Education Importance: Acknowledges Need Interest in Health Problems: Asks Questions Perception: Coherent Willingness to Engage in Self-Management High Activities: Readiness to Engage in Self-Management High Activities: Electronic Signature(s) Signed: 04/15/2023 8:01:26 AM By: Yevonne Pax RN Entered By: Yevonne Pax on 03/27/2023 10:40:13 Kelli Soto ([id number]) [id number] [id number] Page 3 of 5 -------------------------------------------------------------------------------- Fall Risk Assessment Details Patient Name: Date of Service: Kelli Soto  03/27/2023 1:30 PM Medical Record Number: [id number] Patient Account Number: [id number] Date of Birth/Sex: Treating RN: 10-25-1995 (27 y.o. F) Epps, Carrie Primary Care Kylah Maresh: PA TIENT, NO Other Clinician: Referring Tillman Kazmierski: Treating Remo Kirschenmann/Extender: RO BSO N, MICHA EL Willodean Rosenthal, Adrienne Weeks in Treatment: 0 Fall Risk Assessment Items Have you had 2 or more falls in the last 12 monthso 0 Yes Have you had any fall that resulted in injury in the last 12 monthso 0 Yes FALLS RISK SCREEN History of falling - immediate or within 3  months 25 Yes Secondary diagnosis (Do you have 2 or more medical diagnoseso) 0 No Ambulatory aid None/bed rest/wheelchair/nurse 0 Yes Crutches/cane/walker 0 No Furniture 0 No Intravenous therapy Access/Saline/Heparin Lock 0 No Gait/Transferring Normal/ bed rest/ wheelchair 0 Yes Weak (short steps with or without shuffle, stooped but able to lift head while walking, may seek 0 No support from furniture) Impaired (short steps with shuffle, may have difficulty arising from chair, head down, impaired 0 No balance) Mental Status Oriented to own ability 0 Yes Electronic Signature(s) Signed: 04/15/2023 8:01:26 AM By: Yevonne Pax RN Entered By: Yevonne Pax on 03/27/2023 10:40:36 -------------------------------------------------------------------------------- Foot Assessment Details Patient Name: Date of Service: Kelli Soto, Kelli Bal A. 03/27/2023 1:30 PM Medical Record Number: [id number] Patient Account Number: [id number] Date of Birth/Sex: Treating RN: 02/06/1996 (27 y.o. Freddy Finner Primary Care Zaakirah Kistner: PA Zenovia Jordan, NO Other Clinician: Referring Hiren Peplinski: Treating Anique Beckley/Extender: RO BSO N, MICHA EL Willodean Rosenthal, Adrienne Weeks in Treatment: 0 Foot Assessment Items Site Locations BURDELLA, ROWINSKI A ([id number]) [id number] [id number].pdf Page 4 of 5 + = Sensation present, - = Sensation absent, C = Callus, U = Ulcer R = Redness, W = Warmth, M  = Maceration, PU = Pre-ulcerative lesion F = Fissure, S = Swelling, D = Dryness Assessment Right: Left: Other Deformity: No No Prior Foot Ulcer: No No Prior Amputation: No No Charcot Joint: No No Ambulatory Status: Ambulatory Without Help Gait: Steady Electronic Signature(s) Signed: 04/15/2023 8:01:26 AM By: Yevonne Pax RN Entered By: Yevonne Pax on 03/27/2023 10:48:24 -------------------------------------------------------------------------------- Nutrition Risk Screening Details Patient Name: Date of Service: Kelli Kelli Barker A. 03/27/2023 1:30 PM Medical Record Number: [id number] Patient Account Number: [id number] Date of Birth/Sex: Treating RN: 1996/06/04 (27 y.o. Freddy Finner Primary Care Jackelyne Sayer: PA TIENT, NO Other Clinician: Referring Nesanel Aguila: Treating Anastazia Creek/Extender: RO BSO N, MICHA EL Willodean Rosenthal, Adrienne Weeks in Treatment: 0 Height (in): 64 Weight (lbs): 170 Body Mass Index (BMI): 29.2 Nutrition Risk Screening Items Score Screening NUTRITION RISK SCREEN: I have an illness or condition that made me change the kind and/or amount of food I eat 0 No I eat fewer than two meals per day 0 No I eat few fruits and vegetables, or milk products 0 No I have three or more drinks of beer, liquor or wine almost every day 0 No I have tooth or mouth problems that make it hard for me to eat 0 No I don't always have enough money to buy the food I need 0 No Bartles, Edmonia A ([id number]) [id number] [id number].pdf Page 5 of 5 I eat alone most of the time 0 No I take three or more different prescribed or over-the-counter drugs a day 0 No Without wanting to, I have lost or gained 10 pounds in the last six months 0 No I am not always physically able to shop, cook and/or feed myself 0 No Nutrition Protocols Good Risk Protocol 0 No interventions needed Moderate Risk Protocol High Risk Proctocol Risk Level: Good Risk Score: 0 Electronic Signature(s) Signed:  04/15/2023 8:01:26 AM By: Yevonne Pax RN Entered By: Yevonne Pax on 03/27/2023 10:40:46

## 2023-04-15 NOTE — Progress Notes (Signed)
[name], [name] Soto ([id number]) [id number] Page 1 of 10 Visit Report for 03/27/2023 Allergy List Details Patient Name: Date of Service: Kelli [name]. 03/27/2023 1:30 PM Medical Record Number: [id number] Patient Account Number: [id number] Date of Birth/Sex: Treating Soto: August 29, 1995 (27 y.o. Kelli Soto Primary Care Kelli Soto: PA Zenovia Jordan, NO Other Clinician: Referring Elowen Debruyn: Treating Earlyn Sylvan/Extender: RO BSO N, Kelli EL Kelli Soto, Kelli Soto in Treatment: 0 Allergies Active Allergies No Known Allergies Allergy Notes Electronic Signature(s) Signed: 04/15/2023 8:01:26 AM By: Kelli Soto Entered By: Kelli Pax on 03/27/2023 10:37:02 -------------------------------------------------------------------------------- Arrival Information Details Patient Name: Date of Service: Kelli [name], [name]. 03/27/2023 1:30 PM Medical Record Number: [id number] Patient Account Number: [id number] Date of Birth/Sex: Treating Soto: [birth year] (27 y.o. Kelli Soto Primary Care Kelli Soto: PA Zenovia Jordan, NO Other Clinician: Referring Kelli Soto: Treating Kelli Soto/Extender: RO BSO N, [name] in Treatment: 0 Visit Information Patient Arrived: Ambulatory Arrival Time: 13:34 Accompanied By: self Transfer Assistance: None Patient Identification Verified: Yes Secondary Verification Process Completed: Yes Patient Requires Transmission-Based Precautions: No Patient Has Alerts: No Electronic Signature(s) Signed: 04/15/2023 8:01:26 AM By: Kelli Soto Entered By: Kelli Pax on 03/27/2023 10:34:53 Kelli Soto ([id number]) [id number] Page 2 of 10 -------------------------------------------------------------------------------- Clinic Level of Care Assessment Details Patient Name: Date of Service: Kelli Soto 03/27/2023 1:30 PM Medical Record Number: [id number] Patient Account Number: [id number] Date of Birth/Sex:  Treating Soto: 05-Dec-1995 (27 y.o. Kelli Soto Primary Care Kelli Soto: PA Zenovia Jordan, NO Other Clinician: Referring Kelli Soto: Treating Kelli Soto/Extender: RO BSO N, Kelli EL Kelli Soto, Kelli Soto in Treatment: 0 Clinic Level of Care Assessment Items TOOL 2 Quantity Score X- 1 0 Use when only an EandM is performed on the INITIAL visit ASSESSMENTS - Nursing Assessment / Reassessment X- 1 20 General Physical Exam (combine w/ comprehensive assessment (listed just below) when performed on new pt. evals) X- 1 25 Comprehensive Assessment (HX, ROS, Risk Assessments, Wounds Hx, etc.) ASSESSMENTS - Wound and Skin Soto ssessment / Reassessment X - Simple Wound Assessment / Reassessment - one wound 1 5 []  - 0 Complex Wound Assessment / Reassessment - multiple wounds []  - 0 Dermatologic / Skin Assessment (not related to wound area) ASSESSMENTS - Ostomy and/or Continence Assessment and Care []  - 0 Incontinence Assessment and Management []  - 0 Ostomy Care Assessment and Management (repouching, etc.) PROCESS - Coordination of Care X - Simple Patient / Family Education for ongoing care 1 15 []  - 0 Complex (extensive) Patient / Family Education for ongoing care []  - 0 Staff obtains Chiropractor, Records, T Results / Process Orders est []  - 0 Staff telephones HHA, Nursing Homes / Clarify orders / etc []  - 0 Routine Transfer to another Facility (non-emergent condition) []  - 0 Routine Hospital Admission (non-emergent condition) []  - 0 New Admissions / Manufacturing engineer / Ordering NPWT Apligraf, etc. , []  - 0 Emergency Hospital Admission (emergent condition) X- 1 10 Simple Discharge Coordination []  - 0 Complex (extensive) Discharge Coordination PROCESS - Special Needs []  - 0 Pediatric / Minor Patient Management []  - 0 Isolation Patient Management []  - 0 Hearing / Language / Visual special needs []  - 0 Assessment of Community assistance (transportation, D/C planning, etc.) []  -  0 Additional assistance / Altered mentation []  - 0 Support Surface(s) Assessment (bed, cushion, seat, etc.) INTERVENTIONS - Wound Cleansing / Measurement X- 1 5 Wound Imaging (photographs - any number of wounds) Kelli Soto ([id number]) [id number] Page 3 of  10 []  - 0 Wound Tracing (instead of photographs) X- 1 5 Simple Wound Measurement - one wound []  - 0 Complex Wound Measurement - multiple wounds X- 1 5 Simple Wound Cleansing - one wound []  - 0 Complex Wound Cleansing - multiple wounds INTERVENTIONS - Wound Dressings X - Small Wound Dressing one or multiple wounds 1 10 []  - 0 Medium Wound Dressing one or multiple wounds []  - 0 Large Wound Dressing one or multiple wounds []  - 0 Application of Medications - injection INTERVENTIONS - Miscellaneous []  - 0 External ear exam []  - 0 Specimen Collection (cultures, biopsies, blood, body fluids, etc.) []  - 0 Specimen(s) / Culture(s) sent or taken to Lab for analysis []  - 0 Patient Transfer (multiple staff / Nurse, adult / Similar devices) []  - 0 Simple Staple / Suture removal (25 or less) []  - 0 Complex Staple / Suture removal (26 or more) []  - 0 Hypo / Hyperglycemic Management (close monitor of Blood Glucose) X- 1 15 Ankle / Brachial Index (ABI) - do not check if billed separately Has the patient been seen at the hospital within the last three years: Yes Total Score: 115 Level Of Care: New/Established - Level 3 Electronic Signature(s) Signed: 04/15/2023 8:01:26 AM By: Kelli Soto Entered By: Kelli Pax on 03/27/2023 11:39:56 -------------------------------------------------------------------------------- Encounter Discharge Information Details Patient Name: Date of Service: Kelli [name], [name]. 03/27/2023 1:30 PM Medical Record Number: [id number] Patient Account Number: [id number] Date of Birth/Sex: Treating Soto: 04/25/1996 (27 y.o. Kelli Soto Primary Care Chelesea Weiand: PA Zenovia Jordan, NO  Other Clinician: Referring Pharrah Rottman: Treating Amritpal Shropshire/Extender: RO BSO N, [name] in Treatment: 0 Encounter Discharge Information Items Discharge Condition: Stable Ambulatory Status: Ambulatory Discharge Destination: Home Transportation: Private Auto Accompanied By: self Schedule Follow-up Appointment: Yes Clinical Summary of Care: Electronic Signature(s) Signed: 03/27/2023 2:41:04 PM By: Kelli Soto Kelli Soto ([id number]) PM By: Kelli Soto [phone].pdf Page 4 of 10 Signed: 03/27/2023 2:41:04 Entered By: Kelli Pax on 03/27/2023 11:41:03 -------------------------------------------------------------------------------- Lower Extremity Assessment Details Patient Name: Date of Service: Kelli Soto 03/27/2023 1:30 PM Medical Record Number: [id number] Patient Account Number: [id number] Date of Birth/Sex: Treating Soto: 04-Jun-1996 (27 y.o. Kelli Soto Primary Care Charina Fons: PA TIENT, NO Other Clinician: Referring Sevyn Markham: Treating Aslyn Cottman/Extender: RO BSO N, Kelli EL Kelli Soto, Kelli Soto in Treatment: 0 Edema Assessment Assessed: [Left: No] [Right: No] Edema: [Left: Ye] [Right: s] Calf Left: Right: Point of Measurement: 34 cm From Medial Instep 35 cm Ankle Left: Right: Point of Measurement: 10 cm From Medial Instep 19 cm Knee To Floor Left: Right: From Medial Instep 50 cm Vascular Assessment Pulses: Dorsalis Pedis Palpable: [Left:Yes] Doppler Audible: [Left:Yes] Extremity colors, hair growth, and conditions: Extremity Color: [Left:Normal] Hair Growth on Extremity: [Left:Yes] Temperature of Extremity: [Left:Warm] Capillary Refill: [Left:< 3 seconds] Dependent Rubor: [Left:No] Blanched when Elevated: [Left:No] Lipodermatosclerosis: [Left:No] Blood Pressure: Brachial: [Left:110] Ankle: [Left:Dorsalis Pedis: 90 0.82] Toe Nail Assessment Left: Right: Thick: No Discolored: No Deformed:  No Improper Length and Hygiene: No Electronic Signature(s) Signed: 04/15/2023 8:01:26 AM By: Kelli Soto Entered By: Kelli Pax on 03/27/2023 10:52:14 Kelli Soto ([id number]) [id number] Page 5 of 10 -------------------------------------------------------------------------------- Multi Wound Chart Details Patient Name: Date of Service: Kelli [name]. 03/27/2023 1:30 PM Medical Record Number: [id number] Patient Account Number: [id number] Date of Birth/Sex: Treating Soto: 11/27/1995 (27 y.o. Kelli Soto Primary Care Darshan Solanki: PA Zenovia Jordan, NO Other Clinician: Referring Shereen Marton: Treating Adelayde Minney/Extender: RO  BSO N, Kelli EL G White, Kelli Soto in Treatment: 0 Vital Signs Height(in): 64 Pulse(bpm): 62 Weight(lbs): 170 Blood Pressure(mmHg): 110/59 Body Mass Index(BMI): 29.2 Temperature(F): 98 Respiratory Rate(breaths/min): 18 [1:Photos: No Photos Left Knee Wound Location: Trauma Wounding Event: Abrasion Primary Etiology: 03/10/2023 Date Acquired: 0 Soto of Treatment: Open Wound Status: No Wound Recurrence: 2.5x3x0.5 Measurements L x W x D (cm) 5.89 Soto (cm) : Kelli 2.945 Volume (cm) : Full  Thickness Without Exposed Classification: Support Structures Medium Exudate Amount: Serosanguineous Exudate Type: red, brown Exudate Color: Medium (34-66%) Granulation Amount: Red Granulation Quality: Medium (34-66%) Necrotic Amount: Fat Layer  (Subcutaneous Tissue): Yes N/Soto Exposed Structures: Fascia: No Tendon: No Muscle: No Joint: No Bone: No None Epithelialization:] [N/Soto:N/Soto N/Soto N/Soto N/Soto N/Soto N/Soto N/Soto N/Soto N/Soto N/Soto N/Soto N/Soto N/Soto N/Soto N/Soto N/Soto N/Soto N/Soto N/Soto] Treatment Notes Wound #1 (Knee) Wound Laterality: Left Cleanser Soap and Water Discharge Instruction: Gently cleanse wound with antibacterial soap, rinse and pat dry prior to dressing wounds Peri-Wound Care Topical Primary Dressing Hydrofera Blue Ready Transfer Foam, 2.5x2.5 (in/in) Discharge Instruction: Apply  Hydrofera Blue Ready to wound bed as directed Secondary Dressing Gauze Discharge Instruction: As directed: dry, moistened with saline or moistened with Dakins Solution Batts, Halie Soto ([id number]) [id number] Page 6 of 10 Kerlix 4.5 x 4.1 (in/yd) Discharge Instruction: Apply Kerlix 4.5 x 4.1 (in/yd) as instructed Secured With Medipore T - 64M Medipore H Soft Cloth Surgical T ape ape, 2x2 (in/yd) Compression Wrap Compression Stockings Add-Ons Electronic Signature(s) Signed: 03/27/2023 4:50:17 PM By: Baltazar Najjar MD Entered By: Baltazar Najjar on 03/27/2023 11:42:48 -------------------------------------------------------------------------------- Multi-Disciplinary Care Plan Details Patient Name: Date of Service: Kelli [name], [name]. 03/27/2023 1:30 PM Medical Record Number: [id number] Patient Account Number: [id number] Date of Birth/Sex: Treating Soto: 1996-06-03 (27 y.o. Kelli Soto Primary Care Ajahni Nay: PA Zenovia Jordan, NO Other Clinician: Referring Lashya Passe: Treating Jovane Foutz/Extender: RO BSO N, Kelli EL Kelli Soto, Kelli Soto in Treatment: 0 Active Inactive Necrotic Tissue Nursing Diagnoses: Knowledge deficit related to management of necrotic/devitalized tissue Goals: Necrotic/devitalized tissue will be minimized in the wound bed Date Initiated: 03/27/2023 Target Resolution Date: 04/27/2023 Goal Status: Active Interventions: Assess patient pain level pre-, during and post procedure and prior to discharge Provide education on necrotic tissue and debridement process Notes: Wound/Skin Impairment Nursing Diagnoses: Knowledge deficit related to ulceration/compromised skin integrity Goals: Patient/caregiver will verbalize understanding of skin care regimen Date Initiated: 03/27/2023 Target Resolution Date: 04/27/2023 Goal Status: Active Ulcer/skin breakdown will have Soto volume reduction of 30% by week 4 Date Initiated: 03/27/2023 Target Resolution Date:  04/27/2023 Goal Status: Active Ulcer/skin breakdown will have Soto volume reduction of 50% by week 8 Date Initiated: 03/27/2023 Target Resolution Date: 05/27/2023 Goal Status: Active Ulcer/skin breakdown will have Soto volume reduction of 80% by week 12 Date Initiated: 03/27/2023 Target Resolution Date: 06/27/2023 KALAYA, PETRIDES ([id number]) [id number] Page 7 of 10 Goal Status: Active Ulcer/skin breakdown will heal within 14 Soto Date Initiated: 03/27/2023 Target Resolution Date: 06/27/2023 Goal Status: Active Interventions: Assess patient/caregiver ability to obtain necessary supplies Assess patient/caregiver ability to perform ulcer/skin care regimen upon admission and as needed Assess ulceration(s) every visit Notes: Electronic Signature(s) Signed: 04/15/2023 8:01:26 AM By: Kelli Soto Entered By: Kelli Pax on 03/27/2023 11:02:36 -------------------------------------------------------------------------------- Pain Assessment Details Patient Name: Date of Service: Kelli [name]. 03/27/2023 1:30 PM Medical Record Number: [id number] Patient Account Number: [id number] Date of Birth/Sex: Treating Soto: 21-Nov-1995 (27 y.o. Kelli Soto Primary Care Darlean Warmoth: PA TIENT, NO Other  Clinician: Referring Jamilya Sarrazin: Treating Marqus Macphee/Extender: RO BSO N, Kelli EL G White, Kelli Soto in Treatment: 0 Active Problems Location of Pain Severity and Description of Pain Patient Has Paino Yes Site Locations With Dressing Change: Yes Duration of the Pain. Constant / Intermittento Intermittent How Long Does it Lasto Hours: Minutes: 15 Rate the pain. Current Pain Level: 6 Worst Pain Level: 9 Least Pain Level: 0 Tolerable Pain Level: 5 Character of Pain Describe the Pain: Burning, Sharp Pain Management and Medication Current Pain Management: Medication: Yes Cold Application: No Rest: Yes Massage: No Activity: No T.E.N.S.: No Heat Application: No Leg drop  or elevation: No Is the Current Pain Management Adequate: Inadequate How does your wound impact your activities of daily livingo Sleep: Yes Bathing: No Lengel, Kelli Soto ([id number]) [id number] Page 8 of 10 Appetite: No Relationship With Others: No Bladder Continence: No Emotions: No Bowel Continence: No Work: No Toileting: No Drive: No Dressing: No Hobbies: No Electronic Signature(s) Signed: 04/15/2023 8:01:26 AM By: Kelli Soto Entered By: Kelli Pax on 03/27/2023 10:35:55 -------------------------------------------------------------------------------- Patient/Caregiver Education Details Patient Name: Date of Service: Kelli [name], Kelli Soto 10/9/2024andnbsp1:30 PM Medical Record Number: [id number] Patient Account Number: [id number] Date of Birth/Gender: Treating Soto: Nov 24, 1995 (27 y.o. Kelli Soto Primary Care Physician: PA Zenovia Jordan, NO Other Clinician: Referring Physician: Treating Physician/Extender: RO BSO N, [name] in Treatment: 0 Education Assessment Education Provided To: Patient Education Topics Provided Wound/Skin Impairment: Handouts: Caring for Your Ulcer Methods: Explain/Verbal Responses: State content correctly Electronic Signature(s) Signed: 04/15/2023 8:01:26 AM By: Kelli Soto Entered By: Kelli Pax on 03/27/2023 11:03:08 -------------------------------------------------------------------------------- Wound Assessment Details Patient Name: Date of Service: Kelli [name], [name]. 03/27/2023 1:30 PM Medical Record Number: [id number] Patient Account Number: [id number] Date of Birth/Sex: Treating Soto: July 29, 1995 (27 y.o. Kelli Soto Primary Care Maryella Abood: PA Zenovia Jordan, NO Other Clinician: Referring Sharnetta Gielow: Treating Gerlene Glassburn/Extender: RO BSO N, Kelli EL Kelli Soto, Kelli Soto in Treatment: 0 Wound Status Kelli Soto, Kelli Soto ([id number]) [id number] Page 9 of 10 Wound  Number: 1 Primary Etiology: Abrasion Wound Location: Left Knee Wound Status: Open Wounding Event: Trauma Date Acquired: 03/10/2023 Soto Of Treatment: 0 Clustered Wound: No Wound Measurements Length: (cm) 2.5 Width: (cm) 3 Depth: (cm) 0.5 Area: (cm) 5.89 Volume: (cm) 2.945 % Reduction in Area: % Reduction in Volume: Epithelialization: None Tunneling: No Undermining: No Wound Description Classification: Full Thickness Without Exposed Suppor Exudate Amount: Medium Exudate Type: Serosanguineous Exudate Color: red, brown t Structures Foul Odor After Cleansing: No Slough/Fibrino Yes Wound Bed Granulation Amount: Medium (34-66%) Exposed Structure Granulation Quality: Red Fascia Exposed: No Necrotic Amount: Medium (34-66%) Fat Layer (Subcutaneous Tissue) Exposed: Yes Necrotic Quality: Adherent Slough Tendon Exposed: No Muscle Exposed: No Joint Exposed: No Bone Exposed: No Treatment Notes Wound #1 (Knee) Wound Laterality: Left Cleanser Soap and Water Discharge Instruction: Gently cleanse wound with antibacterial soap, rinse and pat dry prior to dressing wounds Peri-Wound Care Topical Primary Dressing Hydrofera Blue Ready Transfer Foam, 2.5x2.5 (in/in) Discharge Instruction: Apply Hydrofera Blue Ready to wound bed as directed Secondary Dressing Gauze Discharge Instruction: As directed: dry, moistened with saline or moistened with Dakins Solution Kerlix 4.5 x 4.1 (in/yd) Discharge Instruction: Apply Kerlix 4.5 x 4.1 (in/yd) as instructed Secured With Medipore T - 14M Medipore H Soft Cloth Surgical T ape ape, 2x2 (in/yd) Compression Wrap Compression Stockings Add-Ons Electronic Signature(s) Signed: 04/15/2023 8:01:26 AM By: Kelli Soto Entered By: Kelli Pax on 03/27/2023 11:01:20 Kelli Soto ([id number]) [id number] Page  10 of 10 -------------------------------------------------------------------------------- Vitals  Details Patient Name: Date of Service: Kelli [name]. 03/27/2023 1:30 PM Medical Record Number: [id number] Patient Account Number: [id number] Date of Birth/Sex: Treating Soto: November 24, 1995 (27 y.o. Kelli Soto Primary Care Weylin Plagge: PA TIENT, NO Other Clinician: Referring Keyanah Kozicki: Treating Yannis Gumbs/Extender: RO BSO N, Kelli EL Kelli Soto, Kelli Soto in Treatment: 0 Vital Signs Time Taken: 13:36 Temperature (F): 98 Height (in): 64 Pulse (bpm): 62 Source: Stated Respiratory Rate (breaths/min): 18 Weight (lbs): 170 Blood Pressure (mmHg): 110/59 Source: Stated Reference Range: 80 - 120 mg / dl Body Mass Index (BMI): 29.2 Electronic Signature(s) Signed: 04/15/2023 8:01:26 AM By: Kelli Soto Entered By: Kelli Pax on 03/27/2023 10:36:44

## 2023-04-22 ENCOUNTER — Other Ambulatory Visit: Payer: Self-pay | Admitting: *Deleted

## 2023-04-22 DIAGNOSIS — Z01419 Encounter for gynecological examination (general) (routine) without abnormal findings: Secondary | ICD-10-CM

## 2023-04-22 MED ORDER — NORELGESTROMIN-ETH ESTRADIOL 150-35 MCG/24HR TD PTWK: MEDICATED_PATCH | TRANSDERMAL | 4 refills | Status: DC

## 2023-04-24 ENCOUNTER — Encounter: Payer: Medicaid Other | Attending: Physician Assistant | Admitting: Physician Assistant

## 2023-04-24 DIAGNOSIS — L97828 Non-pressure chronic ulcer of other part of left lower leg with other specified severity: Secondary | ICD-10-CM | POA: Insufficient documentation

## 2023-04-24 NOTE — Progress Notes (Signed)
Kelli Soto Kelli ([id number]) [id number] Page 1 of 5 Visit Report for 04/24/2023 Chief Complaint Document Details Patient Name: Date of Service: Kelli Soto 04/24/2023 3:45 PM Medical Record Number: [id number] Patient Account Number: [id number] Date of Birth/Sex: Treating Soto: [birth year] (27 y.o. Kelli Soto Primary Care Provider: PA Kelli Soto, [place] Other Clinician: Referring Provider: Treating Provider/Extender: Kelli Soto in Treatment: 4 Information Obtained from: Patient Chief Complaint 10/69/24; patient is here for review of wound which was initially traumatic just below the tibial tuberosity on the left Electronic Signature(s) Signed: 04/24/2023 3:59:33 PM By: Kelli Derry Kelli-C Entered By: Kelli Soto on 04/24/2023 12:59:33 -------------------------------------------------------------------------------- HPI Details Patient Name: Date of Service: Kelli Soto, Kelli Bal Kelli. 04/24/2023 3:45 PM Medical Record Number: [id number] Patient Account Number: [id number] Date of Birth/Sex: Treating Soto: 1995-09-19 (27 y.o. Kelli Soto Primary Care Provider: PA Kelli Soto, [place] Other Clinician: Referring Provider: Treating Provider/Extender: Kelli Soto Weeks in Treatment: 4 History of Present Illness HPI Description: This is an otherwise healthy 27 year old woman who was leaving Kelli Soto on 9/22 she had Kelli fall, her car was parked in rocks. She developed Kelli deep laceration of the left knee just below the tibial tuberosity. 3 sutures were buried in the anterior of the wound. An x-ray did not show foreign body osseous destruction. She was given Kelli course of cephalexin. She was seen in urgent care the next day the sutures were removed the wound was washed with chlorhexidine and Steri-Stripped. She was seen again in the ER on 9/29 Kelli wound culture was obtained that was negative. She was having yellowish to green drainage she was given Kelli course of  prednisone although I am not really sure why. Her culture was ultimately negative. 04-24-2023 upon evaluation today patient actually appears to be showing signs of excellent improvement. I am actually very pleased with where things stand in fact I think she is probably completely healed. Electronic Signature(s) Signed: 04/24/2023 4:34:07 PM By: Kelli Derry Kelli-C Entered By: Kelli Soto on 04/24/2023 13:34:07 Kelli Soto ([id number]) [id number] Page 2 of 5 -------------------------------------------------------------------------------- Physical Exam Details Patient Name: Date of Service: Kelli Soto 04/24/2023 3:45 PM Medical Record Number: [id number] Patient Account Number: [id number] Date of Birth/Sex: Treating Soto: 04-02-1996 (28 y.o. Kelli Soto Primary Care Provider: PA Kelli Soto, [place] Other Clinician: Referring Provider: Treating Provider/Extender: Kelli Soto Weeks in Treatment: 4 Constitutional Well-nourished and well-hydrated in no acute distress. Respiratory normal breathing without difficulty. Psychiatric this patient is able to make decisions and demonstrates good insight into disease process. Alert and Oriented x 3. pleasant and cooperative. Notes Upon inspection patient's wound bed actually showed some dry skin over the area on her knee which actually appears to be completely healed based on what I am seeing. Fortunately I do not see any signs of active infection locally or systemically at this time which is great news. No fevers, chills, nausea, vomiting, or diarrhea. Electronic Signature(s) Signed: 04/24/2023 4:34:42 PM By: Kelli Derry Kelli-C Entered By: Kelli Soto on 04/24/2023 13:34:42 -------------------------------------------------------------------------------- Physician Orders Details Patient Name: Date of Service: Kelli Soto, Kelli Elson Clan Kelli. 04/24/2023 3:45 PM Medical Record Number: [id number] Patient Account Number:  [id number] Date of Birth/Sex: Treating Soto: 08/06/1995 (27 y.o. Kelli Soto Primary Care Provider: PA Kelli Soto, [place] Other Clinician: Referring Provider: Treating Provider/Extender: Kelli Soto Weeks in Treatment: 4 The following information was scribed by: Kelli Soto The information was scribed for: Kelli Soto Verbal /  Phone Orders: No Diagnosis Coding ICD-10 Coding Code Description S81.012D Laceration without foreign body, left knee, subsequent encounter L97.828 Non-pressure chronic ulcer of other part of left lower leg with other specified severity Discharge From [facility] Services Discharge from Wound Care Center Treatment Complete - moisturize area with AandD 3 times per day times 1 week with coverlet - applied in office Kelli, Soto Kelli ([id number]) [id number] Page 3 of 5 Electronic Signature(s) Signed: 04/24/2023 4:35:33 PM By: Kelli Soto Signed: 04/25/2023 11:25:01 AM By: Kelli Derry Kelli-C Entered By: Kelli Soto on 04/24/2023 13:35:32 -------------------------------------------------------------------------------- Problem List Details Patient Name: Date of Service: Kelli Soto, Kelli Bal Kelli. 04/24/2023 3:45 PM Medical Record Number: [id number] Patient Account Number: [id number] Date of Birth/Sex: Treating Soto: [birth year] (27 y.o. Kelli Soto Primary Care Provider: PA Kelli Soto, [place] Other Clinician: Referring Provider: Treating Provider/Extender: Kelli Soto Weeks in Treatment: 4 Active Problems ICD-10 Encounter Code Description Active Date MDM Diagnosis S81.012D Laceration without foreign body, left knee, subsequent encounter 03/27/2023 No Yes L97.828 Non-pressure chronic ulcer of other part of left lower leg with other specified 03/27/2023 No Yes severity Inactive Problems Resolved Problems Electronic Signature(s) Signed: 04/24/2023 3:59:27 PM By: Kelli Derry Kelli-C Entered By: Kelli Soto on 04/24/2023  12:59:27 -------------------------------------------------------------------------------- Progress Note Details Patient Name: Date of Service: Kelli Soto, Kelli Bal Kelli. 04/24/2023 3:45 PM Medical Record Number: [id number] Patient Account Number: [id number] Date of Birth/Sex: Treating Soto: Jul 16, 1995 (27 y.o. Kelli Soto Primary Care Provider: PA Kelli Soto, [place] Other Clinician: Referring Provider: Treating Provider/Extender: Kelli Soto in Treatment: 4 Kramm, Arletta Bale ([id number]) [id number] Page 4 of 5 Subjective Chief Complaint Information obtained from Patient 10/69/24; patient is here for review of wound which was initially traumatic just below the tibial tuberosity on the left History of Present Illness (HPI) This is an otherwise healthy 27 year old woman who was leaving Kelli Soto on 9/22 she had Kelli fall, her car was parked in rocks. She developed Kelli deep laceration of the left knee just below the tibial tuberosity. 3 sutures were buried in the anterior of the wound. An x-ray did not show foreign body osseous destruction. She was given Kelli course of cephalexin. She was seen in urgent care the next day the sutures were removed the wound was washed with chlorhexidine and Steri- Stripped. She was seen again in the ER on 9/29 Kelli wound culture was obtained that was negative. She was having yellowish to green drainage she was given Kelli course of prednisone although I am not really sure why. Her culture was ultimately negative. 04-24-2023 upon evaluation today patient actually appears to be showing signs of excellent improvement. I am actually very pleased with where things stand in fact I think she is probably completely healed. Objective Constitutional Well-nourished and well-hydrated in no acute distress. Vitals Time Taken: 4:00 PM, Height: 64 in, Weight: 170 lbs, BMI: 29.2, Temperature: 98.5 F, Pulse: 87 bpm, Respiratory Rate: 18 breaths/min, Blood  Pressure: 118/75 mmHg. Respiratory normal breathing without difficulty. Psychiatric this patient is able to make decisions and demonstrates good insight into disease process. Alert and Oriented x 3. pleasant and cooperative. General Notes: Upon inspection patient's wound bed actually showed some dry skin over the area on her knee which actually appears to be completely healed based on what I am seeing. Fortunately I do not see any signs of active infection locally or systemically at this time which is great news. No fevers, chills, nausea, vomiting, or diarrhea. Integumentary (Hair, Skin) Wound #1  status is Healed - Epithelialized. Original cause of wound was Trauma. The date acquired was: 03/10/2023. The wound has been in treatment 4 weeks. The wound is located on the Left Knee. The wound measures 0cm length x 0cm width x 0cm depth; 0cm^2 area and 0cm^3 volume. There is no tunneling or undermining noted. There is Kelli none present amount of drainage noted. There is no granulation within the wound bed. There is no necrotic tissue within the wound bed. Assessment Active Problems ICD-10 Laceration without foreign body, left knee, subsequent encounter Non-pressure chronic ulcer of other part of left lower leg with other specified severity Plan Discharge [facility] Services: Discharge from Wound Care Center Treatment Complete - moisturize area with AandD 3 times per day times 1 week with coverlet - applied in office 1. Based on what I am seeing I do feel like the patient's wound is closed which is great news. 2. I am good recommend AandD ointment and just Kelli Band-Aid for protection over top until the dry skin falls off once it does she does not even have to have the dressing any longer. We will see patient back for reevaluation in 1 week here in the clinic. If anything worsens or changes patient will contact our office for additional recommendations. Electronic Signature(s) Signed: 04/24/2023 4:35:12  PM By: Armando Gang ([id number]) PM By: Kelli Derry Kelli-C [phone].pdf Page 5 of 5 Signed: 04/24/2023 4:35:12 Entered By: Kelli Soto on 04/24/2023 13:35:12 -------------------------------------------------------------------------------- SuperBill Details Patient Name: Date of Service: Kelli Soto, Kelli Soto 04/24/2023 Medical Record Number: [id number] Patient Account Number: [id number] Date of Birth/Sex: Treating Soto: 1995-12-12 (27 y.o. Kelli Soto Primary Care Provider: PA Kelli Soto, [place] Other Clinician: Referring Provider: Treating Provider/Extender: Kelli Soto Weeks in Treatment: 4 Diagnosis Coding ICD-10 Codes Code Description S81.012D Laceration without foreign body, left knee, subsequent encounter L97.828 Non-pressure chronic ulcer of other part of left lower leg with other specified severity Facility Procedures : CPT4 Code: [id number] Description: [id number] - WOUND CARE VISIT-LEV 2 EST PT Modifier: Quantity: 1 Physician Procedures : CPT4 Code Description Modifier [id number] - WC PHYS LEVEL 3 - EST PT ICD-10 Diagnosis Description S81.012D Laceration without foreign body, left knee, subsequent encounter L97.828 Non-pressure chronic ulcer of other part of left lower leg with other  specified severity Quantity: 1 Electronic Signature(s) Signed: 04/24/2023 4:35:23 PM By: Kelli Derry Kelli-C Entered By: Kelli Soto on 04/24/2023 13:35:23

## 2023-04-24 NOTE — Progress Notes (Addendum)
TILDA, BARUA Kelli ([id number]) [id number] Page 1 of 8 Visit Report for 04/24/2023 Arrival Information Details Patient Name: Date of Service: Kelli Kelli Soto 04/24/2023 3:45 PM Medical Record Number: [id number] Patient Account Number: [id number] Date of Birth/Sex: Treating RN: 05/09/1996 (27 y.o. Freddy Finner Primary Care Placida Cambre: PA Zenovia Jordan, [place] Other Clinician: Referring Oma Marzan: Treating Wateen Varon/Extender: Babette Relic in Treatment: 4 Visit Information History Since Last Visit Added or deleted any medications: No Patient Arrived: Ambulatory Any new allergies or adverse reactions: No Arrival Time: 16:02 Had Kelli fall or experienced change in No Accompanied By: self activities of daily living that may affect Transfer Assistance: None risk of falls: Patient Identification Verified: Yes Signs or symptoms of abuse/neglect since last visito No Secondary Verification Process Completed: Yes Hospitalized since last visit: No Patient Requires Transmission-Based Precautions: No Implantable device outside of the clinic excluding No Patient Has Alerts: No cellular tissue based products placed in the center since last visit: Has Dressing in Place as Prescribed: Yes Pain Present Now: No Electronic Signature(s) Signed: 05/01/2023 4:43:41 PM By: Yevonne Pax RN Entered By: Yevonne Pax on 04/24/2023 16:02:23 -------------------------------------------------------------------------------- Clinic Level of Care Assessment Details Patient Name: Date of Service: Kelli Kelli Soto 04/24/2023 3:45 PM Medical Record Number: [id number] Patient Account Number: [id number] Date of Birth/Sex: Treating RN: 03/14/1996 (27 y.o. Freddy Finner Primary Care Pedrohenrique Mcconville: PA Zenovia Jordan, [place] Other Clinician: Referring Suman Trivedi: Treating Savanna Dooley/Extender: Babette Relic in Treatment: 4 Clinic Level of Care Assessment Items TOOL 4 Quantity Score X-  1 0 Use when only an EandM is performed on FOLLOW-UP visit ASSESSMENTS - Nursing Assessment / Reassessment X- 1 10 Reassessment of Co-morbidities (includes updates in patient status) X- 1 5 Reassessment of Adherence to Treatment Plan VERA, GEIGER Kelli ([id number]) [id number] Page 2 of 8 ASSESSMENTS - Wound and Skin Kelli ssessment / Reassessment X - Simple Wound Assessment / Reassessment - one wound 1 5 []  - 0 Complex Wound Assessment / Reassessment - multiple wounds []  - 0 Dermatologic / Skin Assessment (not related to wound area) ASSESSMENTS - Focused Assessment []  - 0 Circumferential Edema Measurements - multi extremities []  - 0 Nutritional Assessment / Counseling / Intervention []  - 0 Lower Extremity Assessment (monofilament, tuning fork, pulses) []  - 0 Peripheral Arterial Disease Assessment (using hand held doppler) ASSESSMENTS - Ostomy and/or Continence Assessment and Care []  - 0 Incontinence Assessment and Management []  - 0 Ostomy Care Assessment and Management (repouching, etc.) PROCESS - Coordination of Care X - Simple Patient / Family Education for ongoing care 1 15 []  - 0 Complex (extensive) Patient / Family Education for ongoing care []  - 0 Staff obtains Chiropractor, Records, T Results / Process Orders est []  - 0 Staff telephones HHA, Nursing Homes / Clarify orders / etc []  - 0 Routine Transfer to another Facility (non-emergent condition) []  - 0 Routine Hospital Admission (non-emergent condition) []  - 0 New Admissions / Manufacturing engineer / Ordering NPWT Apligraf, etc. , []  - 0 Emergency Hospital Admission (emergent condition) X- 1 10 Simple Discharge Coordination []  - 0 Complex (extensive) Discharge Coordination PROCESS - Special Needs []  - 0 Pediatric / Minor Patient Management []  - 0 Isolation Patient Management []  - 0 Hearing / Language / Visual special needs []  - 0 Assessment of Community assistance (transportation,  D/C planning, etc.) []  - 0 Additional assistance / Altered mentation []  - 0 Support Surface(s) Assessment (bed, cushion, seat, etc.) INTERVENTIONS - Wound Cleansing / Measurement X -  Simple Wound Cleansing - one wound 1 5 []  - 0 Complex Wound Cleansing - multiple wounds []  - 0 Wound Imaging (photographs - any number of wounds) []  - 0 Wound Tracing (instead of photographs) X- 1 5 Simple Wound Measurement - one wound []  - 0 Complex Wound Measurement - multiple wounds INTERVENTIONS - Wound Dressings X - Small Wound Dressing one or multiple wounds 1 10 []  - 0 Medium Wound Dressing one or multiple wounds []  - 0 Large Wound Dressing one or multiple wounds X- 1 5 Application of Medications - topical []  - 0 Application of Medications - injection INTERVENTIONS - Miscellaneous []  - 0 External ear exam Verdell, Octa Kelli ([id number]) [id number] Page 3 of 8 []  - 0 Specimen Collection (cultures, biopsies, blood, body fluids, etc.) []  - 0 Specimen(s) / Culture(s) sent or taken to Lab for analysis []  - 0 Patient Transfer (multiple staff / Michiel Sites Lift / Similar devices) []  - 0 Simple Staple / Suture removal (25 or less) []  - 0 Complex Staple / Suture removal (26 or more) []  - 0 Hypo / Hyperglycemic Management (close monitor of Blood Glucose) []  - 0 Ankle / Brachial Index (ABI) - do not check if billed separately X- 1 5 Vital Signs Has the patient been seen at the hospital within the last three years: Yes Total Score: 75 Level Of Care: New/Established - Level 2 Electronic Signature(s) Signed: 05/01/2023 4:43:41 PM By: Yevonne Pax RN Entered By: Yevonne Pax on 04/24/2023 16:26:06 -------------------------------------------------------------------------------- Encounter Discharge Information Details Patient Name: Date of Service: Kelli Soto, Kelli Bal Kelli. 04/24/2023 3:45 PM Medical Record Number: [id number] Patient Account Number: [id number] Date of Birth/Sex:  Treating RN: 1995/09/18 (27 y.o. Freddy Finner Primary Care Naelani Lafrance: PA Zenovia Jordan, [place] Other Clinician: Referring Xxavier Noon: Treating Deniz Eskridge/Extender: Babette Relic in Treatment: 4 Encounter Discharge Information Items Discharge Condition: Stable Ambulatory Status: Wheelchair Discharge Destination: Home Transportation: Private Auto Accompanied By: self Schedule Follow-up Appointment: Yes Clinical Summary of Care: Electronic Signature(s) Signed: 05/01/2023 4:43:41 PM By: Yevonne Pax RN Entered By: Yevonne Pax on 04/24/2023 16:32:17 -------------------------------------------------------------------------------- Lower Extremity Assessment Details Patient Name: Date of Service: Kelli Soto, Kelli LETHA Kelli. 04/24/2023 3:45 PM Cherylynn Ridges Kelli ([id number]) [id number] Page 4 of 8 Medical Record Number: [id number] Patient Account Number: [id number] Date of Birth/Sex: Treating RN: May 01, 1996 (27 y.o. Freddy Finner Primary Care Tung Pustejovsky: PA TIENT, [place] Other Clinician: Referring Morrissa Shein: Treating Kemara Quigley/Extender: Marinus Maw Weeks in Treatment: 4 Edema Assessment Assessed: [Left: No] [Right: No] Edema: [Left: N] [Right: o] Calf Left: Right: Point of Measurement: 34 cm From Medial Instep 35 cm Ankle Left: Right: Point of Measurement: 10 cm From Medial Instep 19 cm Knee To Floor Left: Right: From Medial Instep 50 cm Vascular Assessment Pulses: Dorsalis Pedis Palpable: [Left:Yes] Extremity colors, hair growth, and conditions: Extremity Color: [Left:Hyperpigmented] Hair Growth on Extremity: [Left:Yes] Temperature of Extremity: [Left:Warm] Capillary Refill: [Left:< 3 seconds] Dependent Rubor: [Left:No] Blanched when Elevated: [Left:No No] Toe Nail Assessment Left: Right: Thick: No Discolored: No Deformed: No Improper Length and Hygiene: No Electronic Signature(s) Signed: 05/01/2023 4:43:41 PM By: Yevonne Pax  RN Entered By: Yevonne Pax on 04/24/2023 16:20:26 -------------------------------------------------------------------------------- Multi Wound Chart Details Patient Name: Date of Service: Kelli Soto, Kelli Bal Kelli. 04/24/2023 3:45 PM Medical Record Number: [id number] Patient Account Number: [id number] Date of Birth/Sex: Treating RN: [birth year] (27 y.o. Freddy Finner Primary Care Veleda Mun: PA Zenovia Jordan, [place] Other Clinician: Referring Toua Stites: Treating Temima Kutsch/Extender: Marinus Maw Weeks in Treatment: 4  Vital Signs Height(in): 64 Pulse(bpm): 87 Catanzaro, Arianah Kelli ([id number]) [id number] Page 5 of 8 Weight(lbs): 170 Blood Pressure(mmHg): 118/75 Body Mass Index(BMI): 29.2 Temperature(F): 98.5 Respiratory Rate(breaths/min): 18 [1:Photos: No Photos Left Knee Wound Location: Trauma Wounding Event: Abrasion Primary Etiology: 03/10/2023 Date Acquired: 4 Weeks of Treatment: Healed - Epithelialized Wound Status: No Wound Recurrence: 0x0x0 Measurements L x W x D (cm) 0 Kelli (cm) : Kelli 0 Volume (cm)  : 100.00% % Reduction in Kelli Kelli: 100.00% % Reduction in Volume: Full Thickness Without Exposed Classification: Support Structures None Present Exudate Kelli mount: None Present (0%) Granulation Kelli mount: None Present (0%) Necrotic Kelli mount: Large (67-100%)  Epithelialization:] [N/Kelli:N/Kelli N/Kelli N/Kelli N/Kelli N/Kelli N/Kelli N/Kelli N/Kelli N/Kelli N/Kelli N/Kelli N/Kelli N/Kelli N/Kelli N/Kelli N/Kelli N/Kelli N/Kelli] Treatment Notes Electronic Signature(s) Signed: 05/01/2023 4:43:41 PM By: Yevonne Pax RN Entered By: Yevonne Pax on 04/24/2023 16:23:59 -------------------------------------------------------------------------------- Multi-Disciplinary Care Plan Details Patient Name: Date of Service: Kelli Soto, Kelli Bal Kelli. 04/24/2023 3:45 PM Medical Record Number: [id number] Patient Account Number: [id number] Date of Birth/Sex: Treating RN: 1995-07-31 (27 y.o. Freddy Finner Primary Care Cannon Quinton: PA Zenovia Jordan, [place] Other Clinician: Referring  Sari Cogan: Treating Rosielee Corporan/Extender: Marinus Maw Weeks in Treatment: 4 Active Inactive Electronic Signature(s) Signed: 05/01/2023 4:43:41 PM By: Yevonne Pax RN Entered By: Yevonne Pax on 04/24/2023 16:26:27 [name] ([id number]) [id number] Page 6 of 8 -------------------------------------------------------------------------------- Pain Assessment Details Patient Name: Date of Service: Kelli Kelli Soto 04/24/2023 3:45 PM Medical Record Number: [id number] Patient Account Number: [id number] Date of Birth/Sex: Treating RN: Feb 28, 1996 (27 y.o. Freddy Finner Primary Care Carlee Tesfaye: PA Zenovia Jordan, [place] Other Clinician: Referring Kullen Tomasetti: Treating Becky Berberian/Extender: Marinus Maw Weeks in Treatment: 4 Active Problems Location of Pain Severity and Description of Pain Patient Has Paino No Site Locations Pain Management and Medication Current Pain Management: Electronic Signature(s) Signed: 05/01/2023 4:43:41 PM By: Yevonne Pax RN Entered By: Yevonne Pax on 04/24/2023 16:02:57 -------------------------------------------------------------------------------- Patient/Caregiver Education Details Patient Name: Date of Service: Kelli Soto, Kelli Soto 11/6/2024andnbsp3:45 PM Medical Record Number: [id number] Patient Account Number: [id number] Date of Birth/Gender: Treating RN: May 30, 1996 (27 y.o. Freddy Finner Primary Care Physician: PA Zenovia Jordan, [place] Other Clinician: Referring Physician: Treating Physician/Extender: Babette Relic in Treatment: 4 Iwata, Arletta Bale ([id number]) [id number] Page 7 of 8 Education Assessment Education Provided To: Patient Education Topics Provided Wound/Skin Impairment: Handouts: Other: discharge instructions Methods: Explain/Verbal Responses: State content correctly Electronic Signature(s) Signed: 05/01/2023 4:43:41 PM By: Yevonne Pax RN Entered By:  Yevonne Pax on 04/24/2023 16:31:20 -------------------------------------------------------------------------------- Wound Assessment Details Patient Name: Date of Service: Kelli Kelli Soto 04/24/2023 3:45 PM Medical Record Number: [id number] Patient Account Number: [id number] Date of Birth/Sex: Treating RN: [birth year] (27 y.o. Freddy Finner Primary Care Jaclynn Laumann: PA Zenovia Jordan, [place] Other Clinician: Referring Bond Grieshop: Treating Prudencio Velazco/Extender: Marinus Maw Weeks in Treatment: 4 Wound Status Wound Number: 1 Primary Etiology: Abrasion Wound Location: Left Knee Wound Status: Healed - Epithelialized Wounding Event: Trauma Date Acquired: 03/10/2023 Weeks Of Treatment: 4 Clustered Wound: No Wound Measurements Length: (cm) Width: (cm) Depth: (cm) Area: (cm) Volume: (cm) 0 % Reduction in Area: 100% 0 % Reduction in Volume: 100% 0 Epithelialization: Large (67-100%) 0 Tunneling: No 0 Undermining: No Wound Description Classification: Full Thickness Without Exposed Support Exudate Amount: None Present Structures Foul Odor After Cleansing: No Slough/Fibrino No Wound Bed Granulation Amount: None Present (0%) Necrotic Amount: None Present (0%) Treatment Notes Wound #1 (Knee) Wound Laterality: Left Cleanser Peri-Wound Care Topical  HELEYNA, CROCCO Kelli ([id number]) [id number] Page 8 of 8 Primary Dressing Secondary Dressing Secured With Compression Wrap Compression Stockings Add-Ons Electronic Signature(s) Signed: 05/01/2023 4:43:41 PM By: Yevonne Pax RN Entered By: Yevonne Pax on 04/24/2023 16:18:51 -------------------------------------------------------------------------------- Vitals Details Patient Name: Date of Service: Kelli Soto, Kelli Bal Kelli. 04/24/2023 3:45 PM Medical Record Number: [id number] Patient Account Number: [id number] Date of Birth/Sex: Treating RN: 04-05-1996 (27 y.o. Freddy Finner Primary Care Rozanne Heumann: PA Zenovia Jordan, [place] Other  Clinician: Referring Gayla Benn: Treating Jame Morrell/Extender: Marinus Maw Weeks in Treatment: 4 Vital Signs Time Taken: 16:00 Temperature (F): 98.5 Height (in): 64 Pulse (bpm): 87 Weight (lbs): 170 Respiratory Rate (breaths/min): 18 Body Mass Index (BMI): 29.2 Blood Pressure (mmHg): 118/75 Reference Range: 80 - 120 mg / dl Electronic Signature(s) Signed: 05/01/2023 4:43:41 PM By: Yevonne Pax RN Entered By: Yevonne Pax on 04/24/2023 16:02:45

## 2023-06-27 ENCOUNTER — Other Ambulatory Visit: Payer: Self-pay | Admitting: *Deleted

## 2023-06-27 MED ORDER — ONDANSETRON 4 MG PO TBDP
4.0000 mg | ORAL_TABLET | Freq: Four times a day (QID) | ORAL | 0 refills | Status: DC | PRN
Start: 2023-06-27 — End: 2023-10-17

## 2023-07-18 ENCOUNTER — Ambulatory Visit
Admission: EM | Admit: 2023-07-18 | Discharge: 2023-07-18 | Disposition: A | Discharge status: Home / Self Care | Payer: Medicaid Other | Attending: Emergency Medicine | Admitting: Emergency Medicine

## 2023-07-18 DIAGNOSIS — B349 Viral infection, unspecified: Secondary | ICD-10-CM | POA: Diagnosis not present

## 2023-07-18 LAB — POC COVID19/FLU A&B COMBO
Covid Antigen, POC: NEGATIVE
Influenza A Antigen, POC: NEGATIVE
Influenza B Antigen, POC: NEGATIVE

## 2023-07-18 NOTE — ED Provider Notes (Signed)
Kelli Soto    CSN: [id number] Arrival date & time: 07/18/23  1137      History   Chief Complaint Chief Complaint  Patient presents with   Cough   Headache    HPI Kelli Soto is a 28 y.o. female.  Patient presents on day 4 of chills, fatigue, body aches, headache, congestion, cough, nausea.  No OTC medication taken today; she took Mucinex previously.  No shortness of breath, vomiting, diarrhea.  Negative COVID test at home.  The history is provided by the patient and medical records.    Past Medical History:  Diagnosis Date   Anemia    Anxiety    Bronchitis    Chlamydia 02/19/2017   Gonorrhea 2019   Neutropenia (HCC)    Panic attack     Patient Active Problem List   Diagnosis Date Noted   Visit for wound check 04/02/2023   Acute pain of left knee 04/02/2023   Generalized body aches 05/28/2022   Migraine without aura and without status migrainosus, not intractable 12/24/2019   Low iron 02/10/2019   Low serum vitamin D 12/29/2018   Neutropenia (HCC) 12/29/2018    Past Surgical History:  Procedure Laterality Date   CESAREAN SECTION MULTI-GESTATIONAL N/A 06/10/2019   Procedure: CESAREAN SECTION MULTI-GESTATIONAL;  Surgeon: Catalina Antigua, MD;  Location: [facility] ORS;  Service: Obstetrics;  Laterality: N/A;    OB History     Gravida  2   Para  2   Term  1   Preterm  1   AB      Living  3      SAB      IAB      Ectopic      Multiple  1   Live Births  3            Home Medications    Prior to Admission medications   Medication Sig Start Date End Date Taking? Authorizing Provider  norelgestromin-ethinyl estradiol Burr Medico) 150-35 MCG/24HR transdermal patch PLACE 1 PATCH ONTO THE SKIN ONCE A WEEK FOR 3 WEEKS LEAVE OFF FOR THE 4TH TO HAVE YOUR PERIOD 04/22/23  Yes Anyanwu, Jethro Bastos, MD  ondansetron (ZOFRAN-ODT) 4 MG disintegrating tablet Take 1 tablet (4 mg total) by mouth every 6 (six) hours as needed for nausea. 06/27/23    Anyanwu, Jethro Bastos, MD  chlorhexidine (HIBICLENS) 4 % external liquid Apply topically daily. 03/11/23   White, Elita Boone, NP  docusate sodium (COLACE) 50 MG capsule Take 1 capsule (50 mg total) by mouth 2 (two) times daily. 03/17/23   White, Elita Boone, NP  fluconazole (DIFLUCAN) 150 MG tablet TAKE 1 TABLET (150 MG TOTAL) BY MOUTH ONCE FOR 1 DOSE. MAY REPEAT 3 DAYS LATER IF SYMPTOMS PERSIST Patient not taking: Reported on 10/25/2022    [provider]  hydrOXYzine (ATARAX) 25 MG tablet Take 1 tablet (25 mg total) by mouth every 6 (six) hours. Patient not taking: Reported on 10/25/2022 11/22/21   Becky Augusta, NP  lactulose (CHRONULAC) 10 GM/15ML solution Take 15 mLs (10 g total) by mouth daily as needed for mild constipation. 03/17/23   White, Elita Boone, NP  methocarbamol (ROBAXIN) 500 MG tablet Take 1 tablet by mouth as needed. Patient not taking: Reported on 10/25/2022    [provider]  metroNIDAZOLE (METROGEL) 0.75 % vaginal gel PLACE 1 APPLICATORFUL VAGINALLY AT BEDTIME FOR 5 DAYS. Patient not taking: Reported on 10/25/2022 05/17/22   Anyanwu, Jethro Bastos, MD  metroNIDAZOLE (  METROGEL) 0.75 % vaginal gel Place 1 Applicatorful vaginally at bedtime. Apply one applicatorful to vagina at bedtime for 5 days Patient not taking: Reported on 02/27/2023 10/31/22   Calvert Cantor, CNM  ondansetron (ZOFRAN-ODT) 4 MG disintegrating tablet Take 1 tablet (4 mg total) by mouth every 8 (eight) hours as needed for nausea or vomiting. 02/27/23   Mickie Bail, NP  oxyCODONE-acetaminophen (PERCOCET/ROXICET) 5-325 MG tablet Take 1 tablet by mouth every 6 (six) hours as needed for severe pain. Patient not taking: Reported on 03/17/2023 03/10/23   Sponseller, Eugene Gavia, PA-C  predniSONE (DELTASONE) 20 MG tablet Take 2 tablets (40 mg total) by mouth daily. 03/17/23   Valinda Hoar, NP  tinidazole (TINDAMAX) 500 MG tablet Take 4 tablets (2,000 mg total) by mouth daily with breakfast. For two days Patient not  taking: Reported on 04/02/2022 01/16/21   Anyanwu, Jethro Bastos, MD  traMADol (ULTRAM) 50 MG tablet Take 1 tablet (50 mg total) by mouth every 6 (six) hours as needed. 03/11/23   Valinda Hoar, NP    Family History Family History  Problem Relation Age of Onset   Hypertension Mother    Healthy Father    Diabetes Other     Social History Social History   Tobacco Use   Smoking status: Some Days    Types: Cigars   Smokeless tobacco: Never  Vaping Use   Vaping status: Never Used  Substance Use Topics   Alcohol use: No   Drug use: Not Currently    Types: Marijuana    Comment: last smoked 2 weeks from August 28th     Allergies   Patient has no known allergies.   Review of Systems Review of Systems  Constitutional:  Positive for chills and fatigue. Negative for fever.  HENT:  Positive for congestion. Negative for ear pain and sore throat.   Respiratory:  Positive for cough. Negative for shortness of breath.   Gastrointestinal:  Positive for nausea. Negative for diarrhea and vomiting.  Neurological:  Positive for headaches.     Physical Exam Triage Vital Signs ED Triage Vitals  Encounter Vitals Group     BP      Systolic BP Percentile      Diastolic BP Percentile      Pulse      Resp      Temp      Temp src      SpO2      Weight      Height      Head Circumference      Peak Flow      Pain Score      Pain Loc      Pain Education      Exclude from Growth Chart    No data found.  Updated Vital Signs BP 132/73   Pulse 84   Temp 98.9 F (37.2 C)   Resp 18   LMP 07/14/2023 (Exact Date)   SpO2 97%   Breastfeeding No   Visual Acuity Right Eye Distance:   Left Eye Distance:   Bilateral Distance:    Right Eye Near:   Left Eye Near:    Bilateral Near:     Physical Exam Constitutional:      General: She is not in acute distress. HENT:     Right Ear: Tympanic membrane normal.     Left Ear: Tympanic membrane normal.     Nose: Nose normal.      Mouth/Throat:  Mouth: Mucous membranes are moist.     Pharynx: Oropharynx is clear.  Cardiovascular:     Rate and Rhythm: Normal rate and regular rhythm.     Heart sounds: Normal heart sounds.  Pulmonary:     Effort: Pulmonary effort is normal. No respiratory distress.     Breath sounds: Normal breath sounds.  Neurological:     Mental Status: She is alert.      UC Treatments / Results  Labs (all labs ordered are listed, but only abnormal results are displayed) Labs Reviewed  POC COVID19/FLU A&B COMBO    EKG   Radiology No results found.  Procedures Procedures (including critical care time)  Medications Ordered in UC Medications - No data to display  Initial Impression / Assessment and Plan / UC Course  I have reviewed the triage vital signs and the nursing notes.  Pertinent labs & imaging results that were available during my care of the patient were reviewed by me and considered in my medical decision making (see chart for details).    Viral illness.  Rapid COVID and flu negative.  Discussed symptomatic treatment including Tylenol or ibuprofen as needed for fever or discomfort, plain Mucinex as needed for congestion, rest, hydration.  Instructed patient to follow-up with PCP if not improving.  ED precautions given.  Patient agrees to plan of care.   Final Clinical Impressions(s) / UC Diagnoses   Final diagnoses:  Viral illness     Discharge Instructions      The COVID and flu tests are negative.   Take Tylenol or ibuprofen as needed for fever or discomfort.  Take plain Mucinex as needed for congestion.  Rest and keep yourself hydrated.    Follow-up with your primary care provider if your symptoms are not improving.         ED Prescriptions   None    PDMP not reviewed this encounter.   Mickie Bail, NP 07/18/23 [phone]

## 2023-07-18 NOTE — ED Triage Notes (Signed)
Cough, headache, body aches, chills, congestion, nausea, fatigue x 3 days. Taking mucinex, Theraflu.   Pt did a home covid test Tuesday and was negative.

## 2023-07-18 NOTE — Discharge Instructions (Addendum)
The COVID and flu tests are negative.   Take Tylenol or ibuprofen as needed for fever or discomfort.  Take plain Mucinex as needed for congestion.  Rest and keep yourself hydrated.    Follow-up with your primary care provider if your symptoms are not improving.

## 2023-08-23 ENCOUNTER — Other Ambulatory Visit: Payer: Self-pay

## 2023-08-23 ENCOUNTER — Emergency Department

## 2023-08-23 ENCOUNTER — Emergency Department
Admission: EM | Admit: 2023-08-23 | Discharge: 2023-08-23 | Disposition: A | Discharge status: Home / Self Care | Attending: Emergency Medicine | Admitting: Emergency Medicine

## 2023-08-23 DIAGNOSIS — R079 Chest pain, unspecified: Secondary | ICD-10-CM | POA: Diagnosis present

## 2023-08-23 DIAGNOSIS — E876 Hypokalemia: Secondary | ICD-10-CM | POA: Diagnosis not present

## 2023-08-23 DIAGNOSIS — R0789 Other chest pain: Secondary | ICD-10-CM | POA: Insufficient documentation

## 2023-08-23 LAB — CBC
HCT: 35.6 % — ABNORMAL LOW (ref 36.0–46.0)
Hemoglobin: 11.9 g/dL — ABNORMAL LOW (ref 12.0–15.0)
MCH: 30.3 pg (ref 26.0–34.0)
MCHC: 33.4 g/dL (ref 30.0–36.0)
MCV: 90.6 fL (ref 80.0–100.0)
Platelets: 193 10*3/uL (ref 150–400)
RBC: 3.93 MIL/uL (ref 3.87–5.11)
RDW: 12.9 % (ref 11.5–15.5)
WBC: 2.8 10*3/uL — ABNORMAL LOW (ref 4.0–10.5)
nRBC: 0 % (ref 0.0–0.2)

## 2023-08-23 LAB — BASIC METABOLIC PANEL
Anion gap: 8 (ref 5–15)
BUN: 9 mg/dL (ref 6–20)
CO2: 24 mmol/L (ref 22–32)
Calcium: 9 mg/dL (ref 8.9–10.3)
Chloride: 108 mmol/L (ref 98–111)
Creatinine, Ser: 0.65 mg/dL (ref 0.44–1.00)
GFR, Estimated: >60 mL/min (ref >60)
Glucose, Bld: 94 mg/dL (ref 70–99)
Potassium: 3.4 mmol/L — ABNORMAL LOW (ref 3.5–5.1)
Sodium: 140 mmol/L (ref 135–145)

## 2023-08-23 LAB — TROPONIN I (HIGH SENSITIVITY): Troponin I (High Sensitivity): <2 ng/L (ref <18)

## 2023-08-23 MED ORDER — KETOROLAC TROMETHAMINE 30 MG/ML IJ SOLN
30.0000 mg | Freq: Once | INTRAMUSCULAR | Status: AC
  Administered 2023-08-23: 30 mg via INTRAMUSCULAR
  Filled 2023-08-23: qty 1

## 2023-08-23 MED ORDER — POTASSIUM CHLORIDE CRYS ER 20 MEQ PO TBCR
40.0000 meq | EXTENDED_RELEASE_TABLET | Freq: Once | ORAL | Status: AC
  Administered 2023-08-23: 40 meq via ORAL
  Filled 2023-08-23: qty 2

## 2023-08-23 NOTE — ED Triage Notes (Signed)
 Pt to ED for left sided chest pain x3 days causing left eye pain when yawns. NAD noted. RR even and unlabored. Talking on phone. States unsure if anxiety.

## 2023-08-23 NOTE — ED Provider Notes (Signed)
 [facility] Provider Note    Event Date/Time   First MD Initiated Contact with Patient 08/23/23 1030     (approximate)   History   Chest Pain   HPI  Kelli Soto is a 28 y.o. female who presents to the ED for evaluation of Chest Pain   Patient presents to the ED for evaluation of chest pain over the past 3 days.  Started on the right side of her chest and moved across to the left side and has been persistent for 3 days without waxing and waning.  Reports some rhinorrhea without sore throat or cough.  No abdominal pain or emesis.  No shortness of breath.  Reports having multiple family members and close friends passed recently from heart disease in the past year or so and wanted to get checked out.   Physical Exam   Triage Vital Signs: ED Triage Vitals  Encounter Vitals Group     BP 08/23/23 1005 (!) 110/57     Systolic BP Percentile --      Diastolic BP Percentile --      Pulse Rate 08/23/23 1005 (!) 58     Resp 08/23/23 1005 16     Temp 08/23/23 1005 97.9 F (36.6 C)     Temp src --      SpO2 08/23/23 1005 100 %     Weight 08/23/23 1006 160 lb (72.6 kg)     Height 08/23/23 1006 5\' 4"  (1.626 m)     Head Circumference --      Peak Flow --      Pain Score 08/23/23 1005 9     Pain Loc --      Pain Education --      Exclude from Growth Chart --     Most recent vital signs: Vitals:   08/23/23 1005 08/23/23 1034  BP: (!) 110/57 (!) 90/55  Pulse: (!) 58 (!) 53  Resp: 16 18  Temp: 97.9 F (36.6 C)   SpO2: 100% 100%    General: Awake, no distress.  CV:  Good peripheral perfusion.  RRR without appreciable murmur Resp:  Normal effort.  Clear lungs Abd:  No distention.  MSK:  No deformity noted.  Reproducible mild chest discomfort without overlying skin changes. Neuro:  No focal deficits appreciated. Other:     ED Results / Procedures / Treatments   Labs (all labs ordered are listed, but only abnormal results are displayed) Labs  Reviewed  BASIC METABOLIC PANEL - Abnormal; Notable for the following components:      Result Value   Potassium 3.4 (*)    All other components within normal limits  CBC - Abnormal; Notable for the following components:   WBC 2.8 (*)    Hemoglobin 11.9 (*)    HCT 35.6 (*)    All other components within normal limits  POC URINE PREG, ED  TROPONIN I (HIGH SENSITIVITY)    EKG Sinus rhythm with a rate of 54 bpm.  Normal axis and intervals.  No evidence of acute ischemia.  RADIOLOGY CXR interpreted by me without evidence of acute cardiopulmonary pathology.  Official radiology report(s): No results found.  PROCEDURES and INTERVENTIONS:  Procedures  Medications  ketorolac (TORADOL) 30 MG/ML injection 30 mg (30 mg Intramuscular Given 08/23/23 1101)  potassium chloride SA (KLOR-CON M) CR tablet 40 mEq (40 mEq Oral Given 08/23/23 1100)     IMPRESSION / MDM / ASSESSMENT AND PLAN / ED COURSE  I  reviewed the triage vital signs and the nursing notes.  Differential diagnosis includes, but is not limited to, ACS, PTX, PNA, muscle strain/spasm, PE, dissection, anxiety, pleural effusion  {Patient presents with symptoms of an acute illness or injury that is potentially life-threatening.  Low risk young woman presents with atypical chest discomfort that I suspect is possibly of MSK etiology.  Mild hypokalemia that we will replace orally.  Negative troponin, mild leukopenia.  Clear CXR and nonischemic EKG.  Will provide Toradol, replace potassium and I suspect she will be suitable for outpatient management.  Clinical Course as of 08/23/23 1104  Fri Aug 23, 2023  1103 Reassessed and discussed low potassium, expectant management and return precautions. [DS]    Clinical Course User Index [DS] Delton Prairie, MD     FINAL CLINICAL IMPRESSION(S) / ED DIAGNOSES   Final diagnoses:  Other chest pain  Hypokalemia     Rx / DC Orders   ED Discharge Orders     None        Note:  This  document was prepared using Dragon voice recognition software and may include unintentional dictation errors.   Delton Prairie, MD 08/23/23 [phone]

## 2023-09-19 ENCOUNTER — Encounter: Payer: Self-pay | Admitting: Emergency Medicine

## 2023-09-19 ENCOUNTER — Other Ambulatory Visit: Payer: Self-pay

## 2023-09-19 ENCOUNTER — Ambulatory Visit
Admission: EM | Admit: 2023-09-19 | Discharge: 2023-09-19 | Disposition: A | Discharge status: Home / Self Care | Attending: Emergency Medicine | Admitting: Emergency Medicine

## 2023-09-19 DIAGNOSIS — J020 Streptococcal pharyngitis: Secondary | ICD-10-CM

## 2023-09-19 LAB — POC COVID19/FLU A&B COMBO
Covid Antigen, POC: NEGATIVE
Influenza A Antigen, POC: NEGATIVE
Influenza B Antigen, POC: NEGATIVE

## 2023-09-19 LAB — POCT RAPID STREP A (OFFICE): Rapid Strep A Screen: POSITIVE — AB

## 2023-09-19 MED ORDER — AMOXICILLIN 500 MG PO CAPS: 500.0000 mg | ORAL_CAPSULE | Freq: Two times a day (BID) | ORAL | 0 refills | Status: AC

## 2023-09-19 NOTE — ED Triage Notes (Signed)
 Patient presents to [facility] for evaluation of body aches ,sore throat, headache, nasal drainage, hot flashes and loss of appetite x 2 days.

## 2023-09-19 NOTE — ED Provider Notes (Signed)
 [name]    CSN: [id number] Arrival date & time: 09/19/23  1028      History   Chief Complaint Chief Complaint  Patient presents with   URI    HPI Keilin A Gaba is a 28 y.o. female.   Presents for evaluation of generalized bodyaches, intermittent headaches, sore throat, postnasal drainage present for 2 days.  Associated mild nonproductive cough.  Initially thought to be allergies but then pain with swallowing progressively worsened.  Decreased appetite endorses alteration to taste.  Has been using NyQuil and TheraFlu.  Child sick with similar symptoms approximately 2 weeks ago.  Past Medical History:  Diagnosis Date   Anemia    Anxiety    Bronchitis    Chlamydia 02/19/2017   Gonorrhea 2019   Neutropenia (HCC)    Panic attack     Patient Active Problem List   Diagnosis Date Noted   Visit for wound check 04/02/2023   Acute pain of left knee 04/02/2023   Generalized body aches 05/28/2022   Migraine without aura and without status migrainosus, not intractable 12/24/2019   Low iron 02/10/2019   Low serum vitamin D 12/29/2018   Neutropenia (HCC) 12/29/2018    Past Surgical History:  Procedure Laterality Date   CESAREAN SECTION MULTI-GESTATIONAL N/A 06/10/2019   Procedure: CESAREAN SECTION MULTI-GESTATIONAL;  Surgeon: Catalina Antigua, MD;  Location: [facility] LD ORS;  Service: Obstetrics;  Laterality: N/A;    OB History     Gravida  2   Para  2   Term  1   Preterm  1   AB      Living  3      SAB      IAB      Ectopic      Multiple  1   Live Births  3            Home Medications    Prior to Admission medications   Medication Sig Start Date End Date Taking? Authorizing Provider  amoxicillin (AMOXIL) 500 MG capsule Take 1 capsule (500 mg total) by mouth 2 (two) times daily for 10 days. 09/19/23 09/29/23 Yes Bruno Leach R, NP  ondansetron (ZOFRAN-ODT) 4 MG disintegrating tablet Take 1 tablet (4 mg total) by mouth every 6 (six) hours  as needed for nausea. 06/27/23   Anyanwu, Jethro Bastos, MD  chlorhexidine (HIBICLENS) 4 % external liquid Apply topically daily. 03/11/23   Marisol Giambra, Elita Boone, NP  docusate sodium (COLACE) 50 MG capsule Take 1 capsule (50 mg total) by mouth 2 (two) times daily. 03/17/23   Rashauna Tep, Elita Boone, NP  fluconazole (DIFLUCAN) 150 MG tablet TAKE 1 TABLET (150 MG TOTAL) BY MOUTH ONCE FOR 1 DOSE. MAY REPEAT 3 DAYS LATER IF SYMPTOMS PERSIST Patient not taking: Reported on 10/25/2022    [provider]  hydrOXYzine (ATARAX) 25 MG tablet Take 1 tablet (25 mg total) by mouth every 6 (six) hours. Patient not taking: Reported on 10/25/2022 11/22/21   Becky Augusta, NP  lactulose (CHRONULAC) 10 GM/15ML solution Take 15 mLs (10 g total) by mouth daily as needed for mild constipation. 03/17/23   Shericka Johnstone, Elita Boone, NP  methocarbamol (ROBAXIN) 500 MG tablet Take 1 tablet by mouth as needed. Patient not taking: Reported on 10/25/2022    [provider]  metroNIDAZOLE (METROGEL) 0.75 % vaginal gel PLACE 1 APPLICATORFUL VAGINALLY AT BEDTIME FOR 5 DAYS. Patient not taking: Reported on 10/25/2022 05/17/22   Tereso Newcomer, MD  metroNIDAZOLE (METROGEL) 0.75 %  vaginal gel Place 1 Applicatorful vaginally at bedtime. Apply one applicatorful to vagina at bedtime for 5 days Patient not taking: Reported on 02/27/2023 10/31/22   Calvert Cantor, CNM  norelgestromin-ethinyl estradiol [facility] [employer]) 150-35 MCG/24HR transdermal patch PLACE 1 PATCH ONTO THE SKIN ONCE A WEEK FOR 3 WEEKS LEAVE OFF FOR THE 4TH TO HAVE YOUR PERIOD 04/22/23   Anyanwu, Jethro Bastos, MD  ondansetron (ZOFRAN-ODT) 4 MG disintegrating tablet Take 1 tablet (4 mg total) by mouth every 8 (eight) hours as needed for nausea or vomiting. 02/27/23   Mickie Bail, NP  oxyCODONE-acetaminophen (PERCOCET/ROXICET) 5-325 MG tablet Take 1 tablet by mouth every 6 (six) hours as needed for severe pain. Patient not taking: Reported on 03/17/2023 03/10/23   Sponseller, Eugene Gavia, PA-C   predniSONE (DELTASONE) 20 MG tablet Take 2 tablets (40 mg total) by mouth daily. 03/17/23   Valinda Hoar, NP  tinidazole (TINDAMAX) 500 MG tablet Take 4 tablets (2,000 mg total) by mouth daily with breakfast. For two days Patient not taking: Reported on 04/02/2022 01/16/21   Anyanwu, Jethro Bastos, MD  traMADol (ULTRAM) 50 MG tablet Take 1 tablet (50 mg total) by mouth every 6 (six) hours as needed. 03/11/23   Valinda Hoar, NP    Family History Family History  Problem Relation Age of Onset   Hypertension Mother    Healthy Father    Diabetes Other     Social History Social History   Tobacco Use   Smoking status: Some Days    Types: Cigars   Smokeless tobacco: Never  Vaping Use   Vaping status: Never Used  Substance Use Topics   Alcohol use: No   Drug use: Not Currently    Types: Marijuana    Comment: last smoked 2 weeks from August 28th     Allergies   Patient has no known allergies.   Review of Systems Review of Systems   Physical Exam Triage Vital Signs ED Triage Vitals [09/19/23 1103]  Encounter Vitals Group     BP (!) 101/55     Systolic BP Percentile      Diastolic BP Percentile      Pulse Rate 63     Resp 18     Temp 99.1 F (37.3 C)     Temp Source Oral     SpO2 98 %     Weight      Height      Head Circumference      Peak Flow      Pain Score 8     Pain Loc      Pain Education      Exclude from Growth Chart    No data found.  Updated Vital Signs BP (!) 101/55 (BP Location: Left Arm)   Pulse 63   Temp 99.1 F (37.3 C) (Oral)   Resp 18   LMP 08/23/2023   SpO2 98%   Visual Acuity Right Eye Distance:   Left Eye Distance:   Bilateral Distance:    Right Eye Near:   Left Eye Near:    Bilateral Near:     Physical Exam Constitutional:      Appearance: Normal appearance.  HENT:     Head: Normocephalic.     Right Ear: Tympanic membrane, ear canal and external ear normal.     Left Ear: Tympanic membrane, ear canal and external ear  normal.     Nose: Congestion present. No rhinorrhea.     Mouth/Throat:  Tonsils: No tonsillar exudate. 3+ on the right. 3+ on the left.  Eyes:     Extraocular Movements: Extraocular movements intact.  Cardiovascular:     Rate and Rhythm: Normal rate and regular rhythm.     Pulses: Normal pulses.     Heart sounds: Normal heart sounds.  Pulmonary:     Effort: Pulmonary effort is normal.     Breath sounds: Normal breath sounds.  Musculoskeletal:     Cervical back: Normal range of motion.  Lymphadenopathy:     Cervical: Cervical adenopathy present.  Neurological:     Mental Status: She is alert and oriented to person, place, and time. Mental status is at baseline.      UC Treatments / Results  Labs (all labs ordered are listed, but only abnormal results are displayed) Labs Reviewed  POC COVID19/FLU A&B COMBO  POCT RAPID STREP A (OFFICE)    EKG   Radiology No results found.  Procedures Procedures (including critical care time)  Medications Ordered in UC Medications - No data to display  Initial Impression / Assessment and Plan / UC Course  I have reviewed the triage vital signs and the nursing notes.  Pertinent labs & imaging results that were available during my care of the patient were reviewed by me and considered in my medical decision making (see chart for details).   Strep pharyngitis  Rapid testing positive, discussed with patient, amoxicillin 10-day course prescribed, may attempt salt water gargles, throat lozenges, warm to cool liquids per preference, over-the-counter Chloraseptic spray and over-the-counter analgesics for management of discomfort, may follow-up with urgent care as needed if symptoms persist or worsen, note given  Final Clinical Impressions(s) / UC Diagnoses   Final diagnoses:  Strep pharyngitis     Discharge Instructions      Your rapid strep test today was positive  Take amoxicillin twice a day for the next 10 days, daily will  see improvement in about 48 hours and steady progression from there  To be use of salt gargles throat lozenges, warm liquids, teaspoons of honey and over-the-counter clippers septic spray for comfort  May give Tylenol or Motrin every 6 hours as needed for additional comfort  You may follow-up at urgent care as needed      ED Prescriptions     Medication Sig Dispense Auth. Provider   amoxicillin (AMOXIL) 500 MG capsule Take 1 capsule (500 mg total) by mouth 2 (two) times daily for 10 days. 20 capsule Valinda Hoar, NP      PDMP not reviewed this encounter.   Valinda Hoar, NP 09/19/23 1125

## 2023-09-19 NOTE — Discharge Instructions (Addendum)
Your rapid strep test today was positive  Take amoxicillin twice a day for the next 10 days, daily will see improvement in about 48 hours and steady progression from there  To be use of salt gargles throat lozenges, warm liquids, teaspoons of honey and over-the-counter clippers septic spray for comfort  May give Tylenol or Motrin every 6 hours as needed for additional comfort  You may follow-up at urgent care as needed

## 2023-10-17 ENCOUNTER — Ambulatory Visit: Admitting: General Practice

## 2023-10-17 ENCOUNTER — Encounter: Payer: Self-pay | Admitting: General Practice

## 2023-10-17 VITALS — BP 118/78 | HR 67 | Temp 98.1°F | Ht 64.68 in | Wt 152.0 lb

## 2023-10-17 DIAGNOSIS — E611 Iron deficiency: Secondary | ICD-10-CM | POA: Diagnosis not present

## 2023-10-17 DIAGNOSIS — D708 Other neutropenia: Secondary | ICD-10-CM

## 2023-10-17 DIAGNOSIS — G43009 Migraine without aura, not intractable, without status migrainosus: Secondary | ICD-10-CM | POA: Diagnosis not present

## 2023-10-17 DIAGNOSIS — F339 Major depressive disorder, recurrent, unspecified: Secondary | ICD-10-CM

## 2023-10-17 DIAGNOSIS — Z7689 Persons encountering health services in other specified circumstances: Secondary | ICD-10-CM | POA: Insufficient documentation

## 2023-10-17 MED ORDER — SERTRALINE HCL 25 MG PO TABS: 25.0000 mg | ORAL_TABLET | Freq: Every day | ORAL | 0 refills | Status: DC

## 2023-10-17 NOTE — Assessment & Plan Note (Signed)
 Chronic.  Has seen oncologist in the past and all of her work up was negative. Will continue to monitor.

## 2023-10-17 NOTE — Progress Notes (Signed)
 New Patient Office Visit  Subjective    Patient ID: Kelli Soto, female    DOB: [birth year]  Age: 28 y.o. MRN: [id number]  CC:  Chief Complaint  Patient presents with   New Patient (Initial Visit)   Panic Attack    Patient was taking hydroxyzine  but made her nauseous; would like something else.       HPI Kelli Soto is a 28 y.o. female presents to establish care.  Last pcp/physical/labs: usually just goes to the gyn.   Panic Attacks: diagnosed several years ago. Has been managed with hydroxyzine  however it has been making her nauseous. She would like to try something else. She has a history of post partum depression and She denies SI/HI.   Anemia: diagnosed several years ago as a child. Does not currently take iron. Last cbc was on 08/23/23 shows hemoglobin of 11.9. denies any abnormal bleeding.   Outpatient Encounter Medications as of 10/17/2023  Medication Sig   methocarbamol (ROBAXIN) 500 MG tablet Take 1 tablet by mouth as needed.   metroNIDAZOLE  (METROGEL ) 0.75 % vaginal gel PLACE 1 APPLICATORFUL VAGINALLY AT BEDTIME FOR 5 DAYS.   norelgestromin -ethinyl estradiol  (XULANE ) 150-35 MCG/24HR transdermal patch PLACE 1 PATCH ONTO THE SKIN ONCE A WEEK FOR 3 WEEKS LEAVE OFF FOR THE 4TH TO HAVE YOUR PERIOD   ondansetron  (ZOFRAN -ODT) 4 MG disintegrating tablet Take 1 tablet (4 mg total) by mouth every 8 (eight) hours as needed for nausea or vomiting.   sertraline  (ZOLOFT ) 25 MG tablet Take 1 tablet (25 mg total) by mouth daily.   [DISCONTINUED] chlorhexidine  (HIBICLENS ) 4 % external liquid Apply topically daily.   [DISCONTINUED] docusate sodium  (COLACE) 50 MG capsule Take 1 capsule (50 mg total) by mouth 2 (two) times daily.   [DISCONTINUED] fluconazole  (DIFLUCAN ) 150 MG tablet TAKE 1 TABLET (150 MG TOTAL) BY MOUTH ONCE FOR 1 DOSE. MAY REPEAT 3 DAYS LATER IF SYMPTOMS PERSIST (Patient not taking: Reported on 10/25/2022)   [DISCONTINUED] hydrOXYzine  (ATARAX ) 25 MG tablet Take 1 tablet  (25 mg total) by mouth every 6 (six) hours. (Patient not taking: Reported on 10/25/2022)   [DISCONTINUED] lactulose  (CHRONULAC ) 10 GM/15ML solution Take 15 mLs (10 g total) by mouth daily as needed for mild constipation.   [DISCONTINUED] metroNIDAZOLE  (METROGEL ) 0.75 % vaginal gel Place 1 Applicatorful vaginally at bedtime. Apply one applicatorful to vagina at bedtime for 5 days (Patient not taking: Reported on 02/27/2023)   [DISCONTINUED] ondansetron  (ZOFRAN -ODT) 4 MG disintegrating tablet Take 1 tablet (4 mg total) by mouth every 6 (six) hours as needed for nausea.   [DISCONTINUED] oxyCODONE -acetaminophen  (PERCOCET/ROXICET) 5-325 MG tablet Take 1 tablet by mouth every 6 (six) hours as needed for severe pain. (Patient not taking: Reported on 03/17/2023)   [DISCONTINUED] predniSONE  (DELTASONE ) 20 MG tablet Take 2 tablets (40 mg total) by mouth daily.   [DISCONTINUED] tinidazole  (TINDAMAX ) 500 MG tablet Take 4 tablets (2,000 mg total) by mouth daily with breakfast. For two days (Patient not taking: Reported on 04/02/2022)   [DISCONTINUED] traMADol  (ULTRAM ) 50 MG tablet Take 1 tablet (50 mg total) by mouth every 6 (six) hours as needed.   No facility-administered encounter medications on file as of 10/17/2023.    Past Medical History:  Diagnosis Date   Anemia    Anxiety    Bronchitis    Chlamydia 02/19/2017   Generalized body aches 05/28/2022   Gonorrhea 2019   Neutropenia (HCC)    Panic attack     Past Surgical History:  Procedure Laterality Date   CESAREAN SECTION MULTI-GESTATIONAL N/A 06/10/2019   Procedure: CESAREAN SECTION MULTI-GESTATIONAL;  Surgeon: Verlyn Goad, MD;  Location: [facility] LD ORS;  Service: Obstetrics;  Laterality: N/A;    Family History  Problem Relation Age of Onset   Hypertension Mother    Healthy Father    Diabetes Other     Social History   Socioeconomic History   Marital status: Single    Spouse name: Not on file   Number of children: Not on file   Years of  education: Not on file   Highest education level: Not on file  Occupational History   Not on file  Tobacco Use   Smoking status: Some Days    Types: Cigars   Smokeless tobacco: Never  Vaping Use   Vaping status: Never Used  Substance and Sexual Activity   Alcohol use: No   Drug use: Not Currently    Types: Marijuana    Comment: last smoked 2 weeks from August 28th   Sexual activity: Yes    Birth control/protection: Patch  Other Topics Concern   Not on file  Social History Narrative   Lives in [place]; daughter 3 years; no smoking; no alcohol; used to be med Best boy.    Social Drivers of Corporate investment banker Strain: Not on file  Food Insecurity: Not on file  Transportation Needs: Not on file  Physical Activity: Not on file  Stress: Not on file  Social Connections: Not on file  Intimate Partner Violence: Not on file    Review of Systems  Constitutional:  Negative for chills and fever.  Respiratory:  Negative for shortness of breath.   Cardiovascular:  Negative for chest pain.  Gastrointestinal:  Negative for abdominal pain, constipation, diarrhea, heartburn, nausea and vomiting.  Genitourinary:  Negative for dysuria, frequency and urgency.  Neurological:  Negative for dizziness and headaches.  Endo/Heme/Allergies:  Negative for polydipsia.  Psychiatric/Behavioral:  Positive for depression. Negative for suicidal ideas. The patient is nervous/anxious.         Objective    BP 118/78 (BP Location: Left Arm, Patient Position: Sitting, Cuff Size: Normal)   Pulse 67   Temp 98.1 F (36.7 C) (Oral)   Ht 5' 4.68" (1.643 m)   Wt 152 lb (68.9 kg)   SpO2 99%   BMI 25.55 kg/m   Physical Exam Vitals and nursing note reviewed.  Constitutional:      Appearance: Normal appearance.  Cardiovascular:     Rate and Rhythm: Normal rate and regular rhythm.     Pulses: Normal pulses.     Heart sounds: Normal heart sounds.  Pulmonary:     Effort: Pulmonary effort is  normal.     Breath sounds: Normal breath sounds.  Neurological:     Mental Status: She is alert and oriented to person, place, and time.  Psychiatric:        Mood and Affect: Mood normal.        Behavior: Behavior normal.        Thought Content: Thought content normal.        Judgment: Judgment normal.         Assessment & Plan:  Depression, recurrent (HCC) Assessment & Plan: Uncontrolled.  Discussed treatment options.   Start Sertraline  25 mg at bedtime. Rx sent.  Follow up in 4 weeks.   Referral placed for therapy.  Orders: -     Sertraline  HCl; Take 1 tablet (25 mg total) by mouth  daily.  Dispense: 30 tablet; Refill: 0 -     Ambulatory referral to Psychology  Establishing care with new doctor, encounter for Assessment & Plan: EMR reviewed briefly.    Low iron Assessment & Plan: Chronic.  Reviewed labs from March, 2025.   Will recheck labs at next visit. No red flags on exam today.   Migraine without aura and without status migrainosus, not intractable Assessment & Plan: Controlled.   Other neutropenia (HCC) Assessment & Plan: Chronic.  Has seen oncologist in the past and all of her work up was negative. Will continue to monitor.     Return in about 4 weeks (around 11/14/2023) for depression.   Jolanda Nation, NP

## 2023-10-17 NOTE — Assessment & Plan Note (Signed)
 EMR reviewed briefly.

## 2023-10-17 NOTE — Patient Instructions (Signed)
 Start Sertraline  25 mg once daily at bedtime.  Follow up in 4 weeks.   It was a pleasure to meet you today! Please don't hesitate to contact me with any questions. Welcome to [facility]!

## 2023-10-17 NOTE — Assessment & Plan Note (Signed)
 Chronic.  Reviewed labs from March, 2025.   Will recheck labs at next visit. No red flags on exam today.

## 2023-10-17 NOTE — Assessment & Plan Note (Signed)
 Controlled.

## 2023-10-17 NOTE — Assessment & Plan Note (Signed)
 Uncontrolled.  Discussed treatment options.   Start Sertraline  25 mg at bedtime. Rx sent.  Follow up in 4 weeks.   Referral placed for therapy.

## 2023-11-04 ENCOUNTER — Ambulatory Visit: Admitting: General Practice

## 2023-11-07 ENCOUNTER — Other Ambulatory Visit: Payer: Self-pay | Admitting: *Deleted

## 2023-11-07 MED ORDER — METRONIDAZOLE 0.75 % VA GEL: VAGINAL | 0 refills | Status: DC

## 2023-11-08 ENCOUNTER — Other Ambulatory Visit: Payer: Self-pay | Admitting: General Practice

## 2023-11-08 DIAGNOSIS — F339 Major depressive disorder, recurrent, unspecified: Secondary | ICD-10-CM

## 2023-11-14 ENCOUNTER — Other Ambulatory Visit: Payer: Self-pay | Admitting: General Practice

## 2023-11-14 ENCOUNTER — Ambulatory Visit: Admitting: General Practice

## 2023-11-14 ENCOUNTER — Telehealth: Payer: Self-pay | Admitting: General Practice

## 2023-11-14 ENCOUNTER — Encounter: Payer: Self-pay | Admitting: General Practice

## 2023-11-14 ENCOUNTER — Ambulatory Visit: Payer: Self-pay | Admitting: General Practice

## 2023-11-14 VITALS — BP 118/72 | HR 66 | Temp 98.0°F | Ht 64.68 in | Wt 152.0 lb

## 2023-11-14 DIAGNOSIS — Z833 Family history of diabetes mellitus: Secondary | ICD-10-CM | POA: Diagnosis not present

## 2023-11-14 DIAGNOSIS — F339 Major depressive disorder, recurrent, unspecified: Secondary | ICD-10-CM | POA: Diagnosis not present

## 2023-11-14 DIAGNOSIS — E611 Iron deficiency: Secondary | ICD-10-CM

## 2023-11-14 DIAGNOSIS — R7989 Other specified abnormal findings of blood chemistry: Secondary | ICD-10-CM

## 2023-11-14 DIAGNOSIS — R110 Nausea: Secondary | ICD-10-CM | POA: Insufficient documentation

## 2023-11-14 DIAGNOSIS — Z0000 Encounter for general adult medical examination without abnormal findings: Secondary | ICD-10-CM | POA: Diagnosis not present

## 2023-11-14 DIAGNOSIS — Z113 Encounter for screening for infections with a predominantly sexual mode of transmission: Secondary | ICD-10-CM

## 2023-11-14 LAB — COMPREHENSIVE METABOLIC PANEL WITH GFR
ALT: 11 U/L (ref 0–35)
AST: 16 U/L (ref 0–37)
Albumin: 4.6 g/dL (ref 3.5–5.2)
Alkaline Phosphatase: 49 U/L (ref 39–117)
BUN: 9 mg/dL (ref 6–23)
CO2: 26 meq/L (ref 19–32)
Calcium: 9.3 mg/dL (ref 8.4–10.5)
Chloride: 104 meq/L (ref 96–112)
Creatinine, Ser: 0.7 mg/dL (ref 0.40–1.20)
GFR: 118.21 mL/min (ref >60.00)
Glucose, Bld: 87 mg/dL (ref 70–99)
Potassium: 4.5 meq/L (ref 3.5–5.1)
Sodium: 138 meq/L (ref 135–145)
Total Bilirubin: 0.6 mg/dL (ref 0.2–1.2)
Total Protein: 7.2 g/dL (ref 6.0–8.3)

## 2023-11-14 LAB — CBC
HCT: 36.6 % (ref 36.0–46.0)
Hemoglobin: 12.2 g/dL (ref 12.0–15.0)
MCHC: 33.3 g/dL (ref 30.0–36.0)
MCV: 90.2 fl (ref 78.0–100.0)
Platelets: 208 10*3/uL (ref 150.0–400.0)
RBC: 4.06 Mil/uL (ref 3.87–5.11)
RDW: 14.4 % (ref 11.5–15.5)
WBC: 4.6 10*3/uL (ref 4.0–10.5)

## 2023-11-14 LAB — LIPID PANEL
Cholesterol: 173 mg/dL (ref 0–200)
HDL: 64.6 mg/dL (ref >39.00)
LDL Cholesterol: 93 mg/dL (ref 0–99)
NonHDL: 107.99
Total CHOL/HDL Ratio: 3
Triglycerides: 75 mg/dL (ref 0.0–149.0)
VLDL: 15 mg/dL (ref 0.0–40.0)

## 2023-11-14 LAB — HEMOGLOBIN A1C: Hgb A1c MFr Bld: 5.4 % (ref 4.6–6.5)

## 2023-11-14 LAB — VITAMIN D 25 HYDROXY (VIT D DEFICIENCY, FRACTURES): VITD: 23.46 ng/mL — ABNORMAL LOW (ref 30.00–100.00)

## 2023-11-14 LAB — TSH: TSH: 1.36 u[IU]/mL (ref 0.35–5.50)

## 2023-11-14 MED ORDER — ONDANSETRON 4 MG PO TBDP
4.0000 mg | ORAL_TABLET | Freq: Three times a day (TID) | ORAL | 0 refills | Status: DC | PRN
Start: 2023-11-14 — End: 2024-03-20

## 2023-11-14 MED ORDER — VITAMIN D (ERGOCALCIFEROL) 1.25 MG (50000 UNIT) PO CAPS
50000.0000 [IU] | ORAL_CAPSULE | ORAL | 0 refills | Status: AC
Start: 2023-11-14 — End: ?

## 2023-11-14 MED ORDER — SERTRALINE HCL 25 MG PO TABS: 25.0000 mg | ORAL_TABLET | Freq: Every day | ORAL | 1 refills | Status: AC

## 2023-11-14 NOTE — Telephone Encounter (Signed)
 Called and spoke with patient and advised HPV has to be done thru her pap smear. Patient states she is due next year for this and will wait to have checked then.

## 2023-11-14 NOTE — Assessment & Plan Note (Signed)
 Controlled.   Continue sertraline  25 mg at bedtime. Refill sent.

## 2023-11-14 NOTE — Telephone Encounter (Signed)
 Copied from CRM [id number]. Topic: Clinical - Request for Lab/Test Order >> Nov 14, 2023 11:39 AM Rosamond Comes wrote: Reason for CRM: patient calling asking if HPV can be added to the lab order.  Patient phone [phone]

## 2023-11-14 NOTE — Addendum Note (Signed)
 Addended by: Bernadene Brewer on: 11/14/2023 11:24 AM   Modules accepted: Orders

## 2023-11-14 NOTE — Progress Notes (Addendum)
 Established Patient Office Visit  Subjective   Patient ID: Kelli Soto, female    DOB: 1995-08-17  Age: 28 y.o. MRN: [id number]  Chief Complaint  Patient presents with   Depression    Patient taking zoloft  25 mg tabs daily and is doing well on this dose; requests 90 day supply be sent in this time if possible.     Depression        Associated symptoms include no myalgias, no headaches and no suicidal ideas.   [name] is a 28 year old female with past medical history of migraine, neutropenia, low iron and depression presents today for complete physical and follow up of chronic conditions.  Depression: she was evaluated on 10/17/23. Currently managed on sertraline  25 mg once at bedtime. She has been feeling better, less on the edge, not crying as much, less mind racing thoughts. She is sleeping well with it. She does get occasional nausea in the morning.   Immunizations: -Tetanus: Completed in 2024 -Pneumonia: Completed one; due for one  Diet: Fair diet.  Exercise: regular exercise 1-2 days a week. 20 mins.   Eye exam: Completed several years ago.  Dental exam: Completes semi-annually    Pap Smear: Completed in 2023;  has a GYN.    Patient Active Problem List   Diagnosis Date Noted   Family history of diabetes mellitus 11/14/2023   Encounter for screening and preventative care 11/14/2023   Nausea 11/14/2023   Depression, recurrent (HCC) 10/17/2023   Migraine without aura and without status migrainosus, not intractable 12/24/2019   Low iron 02/10/2019   Low serum vitamin D  12/29/2018   Neutropenia (HCC) 12/29/2018   Past Medical History:  Diagnosis Date   Anemia    Anxiety    Bronchitis    Chlamydia 02/19/2017   Generalized body aches 05/28/2022   Gonorrhea 2019   History of chicken pox    Neutropenia (HCC)    Panic attack    Past Surgical History:  Procedure Laterality Date   CESAREAN SECTION MULTI-GESTATIONAL N/A 06/10/2019   Procedure: CESAREAN  SECTION MULTI-GESTATIONAL;  Surgeon: Verlyn Goad, MD;  Location: [facility];  Service: Obstetrics;  Laterality: N/A;   No Known Allergies       11/14/2023   10:40 AM 10/17/2023   10:35 AM 05/09/2015   10:04 AM  Depression screen PHQ 2/9  Decreased Interest 1 0 0  Down, Depressed, Hopeless 1 1 0  PHQ - 2 Score 2 1 0  Altered sleeping 0 0   Tired, decreased energy 1 1   Change in appetite 0 0   Feeling bad or failure about yourself  0 0   Trouble concentrating 0 0   Moving slowly or fidgety/restless 0 0   Suicidal thoughts 0 0   PHQ-9 Score 3 2   Difficult doing work/chores Not difficult at all Not difficult at all        11/14/2023   10:40 AM 10/17/2023   10:35 AM  GAD 7 : Generalized Anxiety Score  Nervous, Anxious, on Edge 0 0  Control/stop worrying 1 1  Worry too much - different things 1 1  Trouble relaxing 0 1  Restless 0 0  Easily annoyed or irritable 0 1  Afraid - awful might happen 0 0  Total GAD 7 Score 2 4  Anxiety Difficulty Not difficult at all Not difficult at all      Review of Systems  Constitutional:  Negative for chills, fever, malaise/fatigue  and weight loss.  HENT:  Negative for congestion, ear discharge, ear pain, hearing loss, nosebleeds, sinus pain, sore throat and tinnitus.   Eyes:  Negative for blurred vision, double vision, pain, discharge and redness.  Respiratory:  Negative for cough, shortness of breath, wheezing and stridor.   Cardiovascular:  Negative for chest pain, palpitations and leg swelling.  Gastrointestinal:  Positive for nausea. Negative for abdominal pain, constipation, diarrhea, heartburn and vomiting.  Genitourinary:  Negative for dysuria, frequency and urgency.  Musculoskeletal:  Negative for myalgias.  Skin:  Negative for rash.  Neurological:  Negative for dizziness, tingling, seizures, weakness and headaches.  Endo/Heme/Allergies:  Negative for polydipsia.  Psychiatric/Behavioral:  Positive for depression. Negative for  substance abuse and suicidal ideas. The patient is not nervous/anxious.       Objective:     BP 118/72 (BP Location: Left Arm, Patient Position: Sitting, Cuff Size: Normal)   Pulse 66   Temp 98 F (36.7 C) (Oral)   Ht 5' 4.68" (1.643 m)   Wt 152 lb (68.9 kg)   SpO2 99%   BMI 25.55 kg/m  BP Readings from Last 3 Encounters:  11/14/23 118/72  10/17/23 118/78  09/19/23 (!) 101/55   Wt Readings from Last 3 Encounters:  11/14/23 152 lb (68.9 kg)  10/17/23 152 lb (68.9 kg)  08/23/23 160 lb (72.6 kg)      Physical Exam Vitals and nursing note reviewed.  Constitutional:      Appearance: Normal appearance.  HENT:     Head: Normocephalic and atraumatic.     Right Ear: Tympanic membrane, ear canal and external ear normal.     Left Ear: Tympanic membrane, ear canal and external ear normal.     Nose: Nose normal.     Mouth/Throat:     Mouth: Mucous membranes are moist.     Pharynx: Oropharynx is clear.  Eyes:     Conjunctiva/sclera: Conjunctivae normal.     Pupils: Pupils are equal, round, and reactive to light.  Cardiovascular:     Rate and Rhythm: Normal rate and regular rhythm.     Pulses: Normal pulses.     Heart sounds: Normal heart sounds.  Pulmonary:     Effort: Pulmonary effort is normal.     Breath sounds: Normal breath sounds.  Abdominal:     General: Abdomen is flat. Bowel sounds are normal.     Palpations: Abdomen is soft.  Musculoskeletal:        General: Normal range of motion.     Cervical back: Normal range of motion.  Skin:    General: Skin is warm and dry.     Capillary Refill: Capillary refill takes less than 2 seconds.  Neurological:     General: No focal deficit present.     Mental Status: She is alert and oriented to person, place, and time. Mental status is at baseline.  Psychiatric:        Mood and Affect: Mood normal.        Behavior: Behavior normal.        Thought Content: Thought content normal.        Judgment: Judgment normal.       No results found for any visits on 11/14/23.     The ASCVD Risk score (Arnett DK, et al., 2019) failed to calculate for the following reasons:   The 2019 ASCVD risk score is only valid for ages 26 to 71    Assessment & Plan:  Encounter for  screening and preventative care Assessment & Plan: Immunizations UTD. Pap smear UTD. Due for pneumonia vaccine- will schedule at health department. Mammogram due, orders placed.  Discussed the importance of a healthy diet and regular exercise in order for weight loss, and to reduce the risk of further co-morbidity.  Exam stable. Labs pending.  Follow up in 1 year for repeat physical.   Orders: -     CBC -     Comprehensive metabolic panel with GFR -     Lipid panel -     Hemoglobin A1c -     TSH -     C. trachomatis/N. gonorrhoeae RNA -     HepB+HepC+HIV Panel -     RPR -     HIV Antibody (routine testing w rflx)  Depression, recurrent (HCC) Assessment & Plan: Controlled.   Continue sertraline  25 mg at bedtime. Refill sent.   Orders: -     Sertraline  HCl; Take 1 tablet (25 mg total) by mouth daily.  Dispense: 90 tablet; Refill: 1 -     TSH  Nausea Assessment & Plan: Intermittent.   Rx sent for zofran .  Orders: -     Ondansetron ; Take 1 tablet (4 mg total) by mouth every 8 (eight) hours as needed for nausea or vomiting.  Dispense: 20 tablet; Refill: 0  Family history of diabetes mellitus Assessment & Plan: Multiple family members with type 2 DM.  Hemoglobin A1c pending.  Orders: -     Hemoglobin A1c  Low iron Assessment & Plan: Cbc and iron studies pending.  Orders: -     CBC -     TSH -     Iron, TIBC and Ferritin Panel  Low serum vitamin D  Assessment & Plan: Vitamin d  level pending.  Orders: -     VITAMIN D  25 Hydroxy (Vit-D Deficiency, Fractures)  Routine screening for STI (sexually transmitted infection) -     C. trachomatis/N. gonorrhoeae RNA -     HepB+HepC+HIV Panel -     RPR -     HIV  Antibody (routine testing w rflx)     Return in about 1 year (around 11/13/2024) for physical.    Jolanda Nation, NP

## 2023-11-14 NOTE — Patient Instructions (Addendum)
 Stop by the lab prior to leaving today. I will notify you of your results once received.   Schedule prevnar 20 at the health department or pharmacy.   Continue sertraline  25 mg at bedtime.   Take zofran  as needed for nausea.   Schedule one year physical.   It was a pleasure to see you today!

## 2023-11-14 NOTE — Addendum Note (Signed)
 Addended by: Jolanda Nation on: 11/14/2023 11:19 AM   Modules accepted: Orders

## 2023-11-14 NOTE — Assessment & Plan Note (Signed)
 Intermittent.   Rx sent for zofran .

## 2023-11-14 NOTE — Assessment & Plan Note (Signed)
Vitamin d level pending

## 2023-11-14 NOTE — Assessment & Plan Note (Signed)
 Multiple family members with type 2 DM.  Hemoglobin A1c pending.

## 2023-11-14 NOTE — Assessment & Plan Note (Signed)
 Immunizations UTD. Pap smear UTD. Due for pneumonia vaccine- will schedule at health department. Mammogram due, orders placed.  Discussed the importance of a healthy diet and regular exercise in order for weight loss, and to reduce the risk of further co-morbidity.  Exam stable. Labs pending.  Follow up in 1 year for repeat physical.

## 2023-11-14 NOTE — Assessment & Plan Note (Signed)
 Cbc and iron studies pending.

## 2023-11-15 LAB — IRON,TIBC AND FERRITIN PANEL
%SAT: 11 % — ABNORMAL LOW (ref 16–45)
Ferritin: 18 ng/mL (ref 16–154)
Iron: 52 ug/dL (ref 40–190)
TIBC: 483 ug/dL — ABNORMAL HIGH (ref 250–450)

## 2023-11-15 LAB — C. TRACHOMATIS/N. GONORRHOEAE RNA
C. trachomatis RNA, TMA: NOT DETECTED
N. gonorrhoeae RNA, TMA: NOT DETECTED

## 2023-11-15 LAB — HIV ANTIBODY (ROUTINE TESTING W REFLEX): HIV 1&2 Ab, 4th Generation: NONREACTIVE

## 2023-11-15 LAB — HEPATITIS C ANTIBODY: Hepatitis C Ab: NONREACTIVE

## 2023-11-15 LAB — RPR: RPR Ser Ql: NONREACTIVE

## 2023-11-15 LAB — HEPATITIS B SURFACE ANTIBODY, QUANTITATIVE: Hep B S AB Quant (Post): <5 m[IU]/mL — ABNORMAL LOW (ref >=10)

## 2023-12-24 ENCOUNTER — Telehealth: Payer: Self-pay

## 2023-12-24 NOTE — Telephone Encounter (Signed)
 Please call patient to set up ov with any provider for this please

## 2023-12-24 NOTE — Telephone Encounter (Signed)
 Copied from CRM [id number]. Topic: General - Other >> Dec 24, 2023  9:41 AM Zenovia PARAS wrote: Reason for CRM:  Patient requesting emotional support letter for dog that is needed for apartment complex. Would like a call to discuss

## 2023-12-24 NOTE — Telephone Encounter (Signed)
 She will require an office visit

## 2023-12-24 NOTE — Telephone Encounter (Signed)
 Does patient need to be seen for this or okay to get letter done?

## 2023-12-26 ENCOUNTER — Encounter: Payer: Self-pay | Admitting: Primary Care

## 2023-12-26 ENCOUNTER — Ambulatory Visit: Admitting: Primary Care

## 2023-12-26 VITALS — BP 118/62 | HR 71 | Temp 97.2°F | Ht 64.68 in | Wt 148.0 lb

## 2023-12-26 DIAGNOSIS — F339 Major depressive disorder, recurrent, unspecified: Secondary | ICD-10-CM | POA: Diagnosis not present

## 2023-12-26 NOTE — Progress Notes (Signed)
 Subjective:    Patient ID: Kelli Soto, female    DOB: 1995/09/23, 28 y.o.   MRN: [id number]  HPI  Kelli Soto is a very pleasant 28 y.o. female patient of [facility] with a history of depression, migraines who presents today requesting a letter.  She is requesting a letter allowing her dog to stay in her apartment for emotional support as an emotional support animal. She's been in her current apartment for 5 years, has had her dog for 4 years in the same apartment complex. She recently moved from a 2 bedroom unit to 3 bedroom unit and the complex manager is questioning her dog. She has a normal [place] dog.  She originally adopted her dog for chronic depression, anxiety, panic attacks 4 years ago.  Her dog is served as an effective emotional support animal over the years.  Her dog helps her to feel calm during anxious situations.   Review of Systems  Psychiatric/Behavioral:  The patient is nervous/anxious.        See HPI         Past Medical History:  Diagnosis Date   Anemia    Anxiety    Bronchitis    Chlamydia 02/19/2017   Generalized body aches 05/28/2022   Gonorrhea 2019   History of chicken pox    Neutropenia (HCC)    Panic attack     Social History   Socioeconomic History   Marital status: Single    Spouse name: Not on file   Number of children: Not on file   Years of education: Not on file   Highest education level: Not on file  Occupational History   Not on file  Tobacco Use   Smoking status: Some Days    Types: Cigars   Smokeless tobacco: Never  Vaping Use   Vaping status: Never Used  Substance and Sexual Activity   Alcohol use: No   Drug use: Not Currently    Types: Marijuana    Comment: last smoked 2 weeks from August 28th   Sexual activity: Yes    Birth control/protection: Patch  Other Topics Concern   Not on file  Social History Narrative   Lives in [place]; daughter 3 years; no smoking; no alcohol; used to be med Best boy.     Social Drivers of Corporate investment banker Strain: Not on file  Food Insecurity: Not on file  Transportation Needs: Not on file  Physical Activity: Not on file  Stress: Not on file  Social Connections: Not on file  Intimate Partner Violence: Not on file    Past Surgical History:  Procedure Laterality Date   CESAREAN SECTION MULTI-GESTATIONAL N/A 06/10/2019   Procedure: CESAREAN SECTION MULTI-GESTATIONAL;  Surgeon: Alger Gong, MD;  Location: [facility];  Service: Obstetrics;  Laterality: N/A;    Family History  Problem Relation Age of Onset   Hypertension Mother    Depression Father    Asthma Father    Hypertension Father    Healthy Father    Diabetes Other     No Known Allergies  Current Outpatient Medications on File Prior to Visit  Medication Sig Dispense Refill   norelgestromin -ethinyl estradiol  (XULANE ) 150-35 MCG/24HR transdermal patch PLACE 1 PATCH ONTO THE SKIN ONCE A WEEK FOR 3 WEEKS LEAVE OFF FOR THE 4TH TO HAVE YOUR PERIOD 9 patch 4   ondansetron  (ZOFRAN -ODT) 4 MG disintegrating tablet Take 1 tablet (4 mg total) by mouth every 8 (eight) hours as needed for  nausea or vomiting. 20 tablet 0   sertraline  (ZOLOFT ) 25 MG tablet Take 1 tablet (25 mg total) by mouth daily. 90 tablet 1   methocarbamol (ROBAXIN) 500 MG tablet Take 1 tablet by mouth as needed. (Patient not taking: Reported on 12/26/2023)     metroNIDAZOLE  (METROGEL ) 0.75 % vaginal gel PLACE 1 APPLICATORFUL VAGINALLY AT BEDTIME FOR 5 DAYS. (Patient not taking: Reported on 12/26/2023) 70 g 0   Vitamin D , Ergocalciferol , (DRISDOL ) 1.25 MG (50000 UNIT) CAPS capsule Take 1 capsule (50,000 Units total) by mouth every 7 (seven) days. (Patient not taking: Reported on 12/26/2023) 12 capsule 0   No current facility-administered medications on file prior to visit.    BP 118/62   Pulse 71   Temp (!) 97.2 F (36.2 C) (Temporal)   Ht 5' 4.68 (1.643 m)   Wt 148 lb (67.1 kg)   LMP 12/15/2023   SpO2 98%   BMI  24.87 kg/m  Objective:   Physical Exam Cardiovascular:     Rate and Rhythm: Normal rate and regular rhythm.  Pulmonary:     Effort: Pulmonary effort is normal.     Breath sounds: Normal breath sounds.  Musculoskeletal:     Cervical back: Neck supple.  Skin:    General: Skin is warm and dry.  Neurological:     Mental Status: She is alert and oriented to person, place, and time.  Psychiatric:        Mood and Affect: Mood normal.           Assessment & Plan:  Depression, recurrent (HCC) Assessment & Plan: Stable.  Agreed to provide letter for support animal that she can continue to have her dog in her apartment. She clearly benefits from having her emotional support animal.         Vontrell Pullman K Audra Bellard, NP

## 2023-12-26 NOTE — Patient Instructions (Signed)
 It was a pleasure meeting you! ? ?

## 2023-12-26 NOTE — Assessment & Plan Note (Signed)
 Stable.  Agreed to provide letter for support animal that she can continue to have her dog in her apartment. She clearly benefits from having her emotional support animal.

## 2024-01-22 ENCOUNTER — Other Ambulatory Visit: Payer: Self-pay | Admitting: General Practice

## 2024-01-22 DIAGNOSIS — E611 Iron deficiency: Secondary | ICD-10-CM

## 2024-01-22 DIAGNOSIS — R7989 Other specified abnormal findings of blood chemistry: Secondary | ICD-10-CM

## 2024-02-10 ENCOUNTER — Other Ambulatory Visit

## 2024-03-03 ENCOUNTER — Telehealth: Payer: Self-pay | Admitting: General Practice

## 2024-03-03 NOTE — Telephone Encounter (Signed)
 Copied from CRM [id number]. Topic: Clinical - Request for Lab/Test Order >> Mar 03, 2024  1:06 PM Shereese L wrote: Reason for CRM: patient is request for a lab order for a swab and blood work

## 2024-03-04 NOTE — Telephone Encounter (Signed)
 Called patient and schedule appt to come in to discuss tomorrow at 10 am

## 2024-03-05 ENCOUNTER — Ambulatory Visit (INDEPENDENT_AMBULATORY_CARE_PROVIDER_SITE_OTHER): Admitting: General Practice

## 2024-03-05 ENCOUNTER — Encounter: Payer: Self-pay | Admitting: General Practice

## 2024-03-05 VITALS — BP 118/60 | HR 97 | Temp 98.0°F | Ht 64.68 in | Wt 146.4 lb

## 2024-03-05 DIAGNOSIS — Z113 Encounter for screening for infections with a predominantly sexual mode of transmission: Secondary | ICD-10-CM

## 2024-03-05 NOTE — Progress Notes (Signed)
 Established Patient Office Visit  Subjective   Patient ID: Kelli Soto, female    DOB: [birth year]  Age: 28 y.o. MRN: [id number]  Chief Complaint  Patient presents with   STD testing    Patient states she got checked every 3 months before and wants to keep a check on things. Patient does not have any sx currently.    HPI  Kelli Soto is a 28 year old with past medical history of migraine, depression, neutropenia presents today for an acute visit.   Discussed the use of AI scribe software for clinical note transcription with the patient, who gave verbal consent to proceed.  History of Present Illness Kelli Soto is a 28 year old female who presents for routine STD testing and follow-up on vitamin D  and anxiety medication management.  She is in a relationship and undergoes routine STD testing every three months, a practice she maintained while on birth control and visiting her OB GYN regularly. She is currently using the birth control patch.   She is due for a vitamin D  level check. She was prescribed vitamin D  50,000 units once weekly but does not recall picking it up from the pharmacy.  She takes sertraline  for anxiety, not on a daily basis, but anticipates using it on bad days. She experiences nausea when taking the medication, which she attributes to inconsistent use.    Patient Active Problem List   Diagnosis Date Noted   Family history of diabetes mellitus 11/14/2023   Encounter for screening and preventative care 11/14/2023   Nausea 11/14/2023   Depression, recurrent (HCC) 10/17/2023   Migraine without aura and without status migrainosus, not intractable 12/24/2019   Low iron 02/10/2019   Low serum vitamin D  12/29/2018   Neutropenia (HCC) 12/29/2018   Past Medical History:  Diagnosis Date   Anemia    Anxiety    Bronchitis    Chlamydia 02/19/2017   Generalized body aches 05/28/2022   Gonorrhea 2019   History of chicken pox    Neutropenia (HCC)    Panic  attack    Past Surgical History:  Procedure Laterality Date   CESAREAN SECTION MULTI-GESTATIONAL N/A 06/10/2019   Procedure: CESAREAN SECTION MULTI-GESTATIONAL;  Surgeon: Alger Gong, MD;  Location: [facility];  Service: Obstetrics;  Laterality: N/A;   No Known Allergies       03/05/2024   10:11 AM 12/26/2023   12:18 PM 11/14/2023   10:40 AM  Depression screen PHQ 2/9  Decreased Interest 1 1 1   Down, Depressed, Hopeless 1 1 1   PHQ - 2 Score 2 2 2   Altered sleeping 2 1 0  Tired, decreased energy 1 1 1   Change in appetite 0 1 0  Feeling bad or failure about yourself  1 1 0  Trouble concentrating 0 0 0  Moving slowly or fidgety/restless 0 0 0  Suicidal thoughts 0 0 0  PHQ-9 Score 6 6 3   Difficult doing work/chores Somewhat difficult Not difficult at all Not difficult at all       03/05/2024   10:12 AM 12/26/2023   12:18 PM 11/14/2023   10:40 AM 10/17/2023   10:35 AM  GAD 7 : Generalized Anxiety Score  Nervous, Anxious, on Edge 0 1 0 0  Control/stop worrying 0 1 1 1   Worry too much - different things 1 1 1 1   Trouble relaxing 0 1 0 1  Restless 0 0 0 0  Easily annoyed or irritable 1 2 0  1  Afraid - awful might happen 1 1 0 0  Total GAD 7 Score 3 7 2 4   Anxiety Difficulty Not difficult at all Not difficult at all Not difficult at all Not difficult at all      Review of Systems  Constitutional:  Negative for chills and fever.  Respiratory:  Negative for shortness of breath.   Cardiovascular:  Negative for chest pain.  Gastrointestinal:  Negative for abdominal pain, constipation, diarrhea, heartburn, nausea and vomiting.  Genitourinary:  Negative for dysuria, frequency and urgency.  Neurological:  Negative for dizziness and headaches.  Endo/Heme/Allergies:  Negative for polydipsia.  Psychiatric/Behavioral:  Negative for depression and suicidal ideas. The patient is not nervous/anxious.       Objective:     BP 118/60   Pulse 97   Temp 98 F (36.7 C) (Oral)   Ht 5'  4.68 (1.643 m)   Wt 146 lb 6.4 oz (66.4 kg)   SpO2 98%   BMI 24.60 kg/m  BP Readings from Last 3 Encounters:  03/05/24 118/60  12/26/23 118/62  11/14/23 118/72   Wt Readings from Last 3 Encounters:  03/05/24 146 lb 6.4 oz (66.4 kg)  12/26/23 148 lb (67.1 kg)  11/14/23 152 lb (68.9 kg)      Physical Exam Vitals and nursing note reviewed.  Constitutional:      Appearance: Normal appearance.  Cardiovascular:     Rate and Rhythm: Normal rate and regular rhythm.     Pulses: Normal pulses.     Heart sounds: Normal heart sounds.  Pulmonary:     Effort: Pulmonary effort is normal.     Breath sounds: Normal breath sounds.  Neurological:     Mental Status: She is alert and oriented to person, place, and time.  Psychiatric:        Mood and Affect: Mood normal.        Behavior: Behavior normal.        Thought Content: Thought content normal.        Judgment: Judgment normal.      No results found for any visits on 03/05/24.     The ASCVD Risk score (Arnett DK, et al., 2019) failed to calculate for the following reasons:   The 2019 ASCVD risk score is only valid for ages 64 to 36    Assessment & Plan:  Routine screening for STI (sexually transmitted infection) -     RPR -     HIV Antibody (routine testing w rflx) -     Hepatitis C antibody -     Chlamydia/Gonococcus/Trichomonas, NAA -     HSV(herpes simplex vrs) 1+2 ab-IgG    Assessment and Plan Assessment & Plan Screening for sexually transmitted infections - Labs pending.  Depression and panic disorder Not taking sertraline  daily as recommended, leading to ineffective management. Experiences nausea, a common side effect that resolves with consistent use. - Instruct to take sertraline  25 mg daily, not as needed. - Educate on the importance of daily use for effectiveness  - Discuss potential for as-needed medication if daily use is not suitable.  Vitamin D  deficiency Did not pick up previously prescribed  vitamin D  supplementation. Emphasized importance for mood and anxiety management. - Instruct to start vitamin D  50,000 units once weekly. - Recheck vitamin D  levels in three months. - Ensure prescription is picked up from the pharmacy.  Anemia Iron levels previously low, due for recheck. - will check at next visit.    Return  in about 3 months (around 06/04/2024) for vitamin d  and STD testing.SABRA Carrol Aurora, NP

## 2024-03-05 NOTE — Patient Instructions (Addendum)
 Stop by the lab prior to leaving today. I will notify you of your results once received.   Please make sure that you start the Vitamin D  50,000 units once weekly. Recheck in 3 months.   Please take sertraline  daily and not as needed.   Follow up in 3 months.  It was a pleasure to see you today!

## 2024-03-06 LAB — HSV(HERPES SIMPLEX VRS) I + II AB-IGG
HSV 1 IGG,TYPE SPECIFIC AB: 23.8 {index} — ABNORMAL HIGH
HSV 2 IGG,TYPE SPECIFIC AB: <0.9 {index}

## 2024-03-06 LAB — HIV ANTIBODY (ROUTINE TESTING W REFLEX)
HIV 1&2 Ab, 4th Generation: NONREACTIVE
HIV FINAL INTERPRETATION: NEGATIVE

## 2024-03-06 LAB — HEPATITIS C ANTIBODY: Hepatitis C Ab: NONREACTIVE

## 2024-03-06 LAB — RPR: RPR Ser Ql: NONREACTIVE

## 2024-03-07 ENCOUNTER — Ambulatory Visit: Payer: Self-pay | Admitting: General Practice

## 2024-03-09 ENCOUNTER — Ambulatory Visit: Admitting: Obstetrics and Gynecology

## 2024-03-09 LAB — CHLAMYDIA/GONOCOCCUS/TRICHOMONAS, NAA
Chlamydia by NAA: NEGATIVE
Gonococcus by NAA: NEGATIVE
Trich vag by NAA: NEGATIVE

## 2024-03-09 NOTE — Telephone Encounter (Signed)
 Patient had several questions about results. I have set up virtual to review all results with patient.

## 2024-03-10 ENCOUNTER — Encounter: Payer: Self-pay | Admitting: General Practice

## 2024-03-10 ENCOUNTER — Telehealth (INDEPENDENT_AMBULATORY_CARE_PROVIDER_SITE_OTHER): Admitting: General Practice

## 2024-03-10 VITALS — Ht 64.68 in | Wt 165.0 lb

## 2024-03-10 DIAGNOSIS — B009 Herpesviral infection, unspecified: Secondary | ICD-10-CM | POA: Insufficient documentation

## 2024-03-10 DIAGNOSIS — F339 Major depressive disorder, recurrent, unspecified: Secondary | ICD-10-CM

## 2024-03-10 DIAGNOSIS — R894 Abnormal immunological findings in specimens from other organs, systems and tissues: Secondary | ICD-10-CM | POA: Diagnosis not present

## 2024-03-10 NOTE — Progress Notes (Signed)
 Virtual Visit via Video Note  I connected with Kelli Soto on 03/10/24 at  9:20 AM EDT by a video enabled telemedicine application and verified that I am speaking with the correct person using two identifiers.  Patient Location: Home Provider Location: Office/Clinic  I discussed the limitations, risks, security, and privacy concerns of performing an evaluation and management service by video and the availability of in person appointments. I also discussed with the patient that there may be a patient responsible charge related to this service. The patient expressed understanding and agreed to proceed.  Subjective: PCP: Vincente Shivers, NP  Chief Complaint  Patient presents with   review labs    Patient has never had HSV done in the past and is concerned   HPI  Discussed the use of AI scribe software for clinical note transcription with the patient, who gave verbal consent to proceed.  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  She experienced a breakdown after reviewing recent lab results indicating exposure to HSV-1. She is confused about the results as she has not had a cold sore since 2023 and has not engaged in high-risk behaviors such as kissing or oral sex with anyone other than her child's father, who she has not observed with any symptoms.  She has a history of cold sores since childhood, often occurring during cold weather. Her mother and grandmother also experienced similar symptoms, with her grandmother having blisters on her hip and mouth. Her cousin continues to get cold sores as well. She recalls her last cold sore was in 2023.  She has been undergoing regular STI screenings every three months but is unsure if previous tests included HSV-1. She recalls getting tested at her OB GYN and the public health department since she was 16, but does not remember HSV-1 being  checked. She was treated for chlamydia at age 89.  She is concerned about explaining the situation to her child's father, fearing he might misunderstand the source of the HSV-1 exposure. No recent cold sores or blisters in her mouth or nose.   ROS: Per HPI  Current Outpatient Medications:    methocarbamol (ROBAXIN) 500 MG tablet, Take 1 tablet by mouth as needed., Disp: , Rfl:    metroNIDAZOLE  (METROGEL ) 0.75 % vaginal gel, PLACE 1 APPLICATORFUL VAGINALLY AT BEDTIME FOR 5 DAYS., Disp: 70 g, Rfl: 0   norelgestromin -ethinyl estradiol  (XULANE ) 150-35 MCG/24HR transdermal patch, PLACE 1 PATCH ONTO THE SKIN ONCE A WEEK FOR 3 WEEKS LEAVE OFF FOR THE 4TH TO HAVE YOUR PERIOD, Disp: 9 patch, Rfl: 4   ondansetron  (ZOFRAN -ODT) 4 MG disintegrating tablet, Take 1 tablet (4 mg total) by mouth every 8 (eight) hours as needed for nausea or vomiting., Disp: 20 tablet, Rfl: 0   sertraline  (ZOLOFT ) 25 MG tablet, Take 1 tablet (25 mg total) by mouth daily., Disp: 90 tablet, Rfl: 1   Vitamin D , Ergocalciferol , (DRISDOL ) 1.25 MG (50000 UNIT) CAPS capsule, Take 1 capsule (50,000 Units total) by mouth every 7 (seven) days., Disp: 12 capsule, Rfl: 0  Observations/Objective: Today's Vitals   03/10/24 0927  Weight: 165 lb (74.8 kg)  Height: 5' 4.68 (1.643 m)   Physical Exam Nursing note reviewed.  Constitutional:      Appearance: Normal appearance.  Eyes:     Conjunctiva/sclera: Conjunctivae normal.  Pulmonary:     Effort: Pulmonary effort is normal.  Neurological:  Mental Status: She is alert and oriented to person, place, and time.  Psychiatric:        Mood and Affect: Mood normal.        Behavior: Behavior normal.        Thought Content: Thought content normal.        Judgment: Judgment normal.     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 - Advise to avoid kissing or oral contact during outbreaks. - Encourage obtaining past test results from health department for baseline comparison. - Handouts provided.  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 - Use hydroxyzine  as needed for anxiety management.     Follow Up Instructions: No follow-ups on file.   I discussed the assessment and treatment plan with the patient. The patient was provided an opportunity to ask questions, and all were answered. The patient agreed with the plan and demonstrated an understanding of the instructions.   The patient was advised to call back or seek an in-person evaluation if the symptoms worsen or if the condition fails to improve as anticipated.  The above assessment and management plan was discussed with the patient. The patient verbalized understanding of and has agreed to the management plan.   Carrol Aurora, NP

## 2024-03-10 NOTE — Patient Instructions (Signed)
 Obtain previous results for baseline.   Let me know if you have any questions or concerns.   It was a pleasure to see you today!

## 2024-03-16 ENCOUNTER — Emergency Department
Admission: EM | Admit: 2024-03-16 | Discharge: 2024-03-16 | Disposition: A | Discharge status: Home / Self Care | Attending: Emergency Medicine | Admitting: Emergency Medicine

## 2024-03-16 ENCOUNTER — Other Ambulatory Visit: Payer: Self-pay

## 2024-03-16 ENCOUNTER — Ambulatory Visit
Admission: EM | Admit: 2024-03-16 | Discharge: 2024-03-16 | Disposition: A | Discharge status: Home / Self Care | Attending: Emergency Medicine | Admitting: Emergency Medicine

## 2024-03-16 ENCOUNTER — Emergency Department

## 2024-03-16 DIAGNOSIS — R1030 Lower abdominal pain, unspecified: Secondary | ICD-10-CM

## 2024-03-16 DIAGNOSIS — O26891 Other specified pregnancy related conditions, first trimester: Secondary | ICD-10-CM | POA: Insufficient documentation

## 2024-03-16 DIAGNOSIS — Z3A01 Less than 8 weeks gestation of pregnancy: Secondary | ICD-10-CM

## 2024-03-16 DIAGNOSIS — R1084 Generalized abdominal pain: Secondary | ICD-10-CM | POA: Diagnosis not present

## 2024-03-16 DIAGNOSIS — R110 Nausea: Secondary | ICD-10-CM | POA: Diagnosis not present

## 2024-03-16 DIAGNOSIS — Z3201 Encounter for pregnancy test, result positive: Secondary | ICD-10-CM

## 2024-03-16 DIAGNOSIS — O3680X0 Pregnancy with inconclusive fetal viability, not applicable or unspecified: Secondary | ICD-10-CM

## 2024-03-16 LAB — COMPREHENSIVE METABOLIC PANEL WITH GFR
ALT: 23 U/L (ref 0–44)
AST: 25 U/L (ref 15–41)
Albumin: 4.2 g/dL (ref 3.5–5.0)
Alkaline Phosphatase: 44 U/L (ref 38–126)
Anion gap: 8 (ref 5–15)
BUN: 11 mg/dL (ref 6–20)
CO2: 23 mmol/L (ref 22–32)
Calcium: 8.9 mg/dL (ref 8.9–10.3)
Chloride: 105 mmol/L (ref 98–111)
Creatinine, Ser: 0.65 mg/dL (ref 0.44–1.00)
GFR, Estimated: >60 mL/min (ref >60)
Glucose, Bld: 93 mg/dL (ref 70–99)
Potassium: 3.5 mmol/L (ref 3.5–5.1)
Sodium: 136 mmol/L (ref 135–145)
Total Bilirubin: 1.1 mg/dL (ref 0.0–1.2)
Total Protein: 7.4 g/dL (ref 6.5–8.1)

## 2024-03-16 LAB — HCG, QUANTITATIVE, PREGNANCY: hCG, Beta Chain, Quant, S: 3017 m[IU]/mL — ABNORMAL HIGH (ref <5)

## 2024-03-16 LAB — POCT URINE DIPSTICK
Blood, UA: NEGATIVE
Glucose, UA: NEGATIVE mg/dL
Leukocytes, UA: NEGATIVE
Nitrite, UA: NEGATIVE
Spec Grav, UA: 1.025 (ref 1.010–1.025)
Urobilinogen, UA: 1 U/dL
pH, UA: 6 (ref 5.0–8.0)

## 2024-03-16 LAB — CBC
HCT: 33 % — ABNORMAL LOW (ref 36.0–46.0)
Hemoglobin: 11.2 g/dL — ABNORMAL LOW (ref 12.0–15.0)
MCH: 30.3 pg (ref 26.0–34.0)
MCHC: 33.9 g/dL (ref 30.0–36.0)
MCV: 89.2 fL (ref 80.0–100.0)
Platelets: 224 K/uL (ref 150–400)
RBC: 3.7 MIL/uL — ABNORMAL LOW (ref 3.87–5.11)
RDW: 13.1 % (ref 11.5–15.5)
WBC: 4.8 K/uL (ref 4.0–10.5)
nRBC: 0 % (ref 0.0–0.2)

## 2024-03-16 LAB — POCT URINE PREGNANCY: Preg Test, Ur: POSITIVE — AB

## 2024-03-16 MED ORDER — VITAMIN B-6 25 MG PO TABS: 25.0000 mg | ORAL_TABLET | Freq: Four times a day (QID) | ORAL | 0 refills | Status: AC | PRN

## 2024-03-16 MED ORDER — DIPHENHYDRAMINE HCL (SLEEP) 25 MG PO TBDP: 25.0000 mg | ORAL_TABLET | Freq: Every evening | ORAL | 2 refills | Status: DC | PRN

## 2024-03-16 MED ORDER — ACETAMINOPHEN 500 MG PO TABS
1000.0000 mg | ORAL_TABLET | Freq: Once | ORAL | Status: AC
  Administered 2024-03-16: 1000 mg via ORAL
  Filled 2024-03-16: qty 2

## 2024-03-16 MED ORDER — METOCLOPRAMIDE HCL 10 MG PO TABS
10.0000 mg | ORAL_TABLET | Freq: Once | ORAL | Status: AC
  Administered 2024-03-16: 10 mg via ORAL
  Filled 2024-03-16: qty 1

## 2024-03-16 NOTE — ED Provider Notes (Signed)
 [facility] Provider Note    Event Date/Time   First MD Initiated Contact with Patient 03/16/24 1003     (approximate)   History   Abdominal Pain   HPI  Kelli Soto is a 28 y.o. female presents to the emergency department with abdominal pain.  Patient endorses 5 days of lower abdominal pain associated with nausea and constipation.  Evaluated urgent care just prior to arrival and had a positive pregnancy test and was sent to the emergency department for further evaluation.  Denies fever or chills.  Recently evaluated on 9/18 with her primary care physician for routine care, was negative for pregnancy, STIs at that time.  Patient states that she has been pregnant previously and has an 25-year-old and 28 year old.  No prior history of ectopic pregnancy or miscarriage.  No prior abdominal surgery.  Denies vaginal bleeding.  No prior need for RhoGAM.  Denies dysuria, urinary urgency or frequency.  Denies any vaginal discharge or concern for STI.     Physical Exam   Triage Vital Signs: ED Triage Vitals  Encounter Vitals Group     BP 03/16/24 0948 117/67     Girls Systolic BP Percentile --      Girls Diastolic BP Percentile --      Boys Systolic BP Percentile --      Boys Diastolic BP Percentile --      Pulse Rate 03/16/24 0948 61     Resp 03/16/24 0948 18     Temp 03/16/24 0948 98.5 F (36.9 C)     Temp src --      SpO2 03/16/24 0948 99 %     Weight 03/16/24 0946 155 lb (70.3 kg)     Height 03/16/24 0946 5' 4 (1.626 m)     Head Circumference --      Peak Flow --      Pain Score 03/16/24 0946 8     Pain Loc --      Pain Education --      Exclude from Growth Chart --     Most recent vital signs: Vitals:   03/16/24 0948  BP: 117/67  Pulse: 61  Resp: 18  Temp: 98.5 F (36.9 C)  SpO2: 99%    Physical Exam Constitutional:      Appearance: She is well-developed.  HENT:     Head: Atraumatic.  Eyes:     Conjunctiva/sclera: Conjunctivae  normal.  Cardiovascular:     Rate and Rhythm: Regular rhythm.  Pulmonary:     Effort: No respiratory distress.  Abdominal:     General: There is no distension.     Tenderness: There is abdominal tenderness in the suprapubic area. There is no guarding or rebound.  Musculoskeletal:        General: Normal range of motion.     Cervical back: Normal range of motion.  Skin:    General: Skin is warm.     Capillary Refill: Capillary refill takes less than 2 seconds.  Neurological:     Mental Status: She is alert. Mental status is at baseline.      IMPRESSION / MDM / ASSESSMENT AND PLAN / ED COURSE  I reviewed the triage vital signs and the nursing notes.  Differential diagnosis including pregnancy, ectopic pregnancy, urinary tract infection, electrolyte abnormality, dehydration.  Have low suspicion for acute appendicitis, no significant right lower quadrant abdominal tenderness to palpation.  No fever or chills.  No right upper quadrant pain and  have a low suspicion for acute cholecystitis.  No prior abdominal surgeries and low concern for SBO.  Plan for lab work.  Added on beta quant.  I can see her positive pregnancy test from urgent care.   RADIOLOGY I independently reviewed imaging, my interpretation of imaging: Pelvic ultrasound -gestational sac but no yolk sac no obvious findings of an ectopic pregnancy no free fluid   Labs (all labs ordered are listed, but only abnormal results are displayed) Labs interpreted as -    Labs Reviewed  CBC - Abnormal; Notable for the following components:      Result Value   RBC 3.70 (*)    Hemoglobin 11.2 (*)    HCT 33.0 (*)    All other components within normal limits  HCG, QUANTITATIVE, PREGNANCY - Abnormal; Notable for the following components:   hCG, Beta Chain, Quant, S 3,017 (*)    All other components within normal limits  COMPREHENSIVE METABOLIC PANEL WITH GFR    Given Tylenol  and Reglan  for pain control.  Discussed risk benefits  of medications while pregnant.  Patient with no leukocytosis.  Anemia but hemoglobin appears to be stable.  Normal platelets.  Ultrasound concerning for small single IUP with gestational sac but no embryo seen.  Discussed with gynecology who recommended outpatient ultrasound in 10 to 14 days.  Given prescription for antiemetics.  Discussed return precautions for any bleeding or worsening abdominal pain.  Repeat abdominal exam continues to be benign with no significant tenderness and have a low suspicion for ruptured ectopic pregnancy.     PROCEDURES:  Critical Care performed: No  Procedures  Patient's presentation is most consistent with acute presentation with potential threat to life or bodily function.   MEDICATIONS ORDERED IN ED: Medications  acetaminophen  (TYLENOL ) tablet 1,000 mg (1,000 mg Oral Given 03/16/24 1044)  metoCLOPramide  (REGLAN ) tablet 10 mg (10 mg Oral Given 03/16/24 1044)    FINAL CLINICAL IMPRESSION(S) / ED DIAGNOSES   Final diagnoses:  Pregnancy of unknown anatomic location  Generalized abdominal pain     Rx / DC Orders   ED Discharge Orders          Ordered    pyridOXINE  (VITAMIN B6) 25 MG tablet  Every 6 hours PRN        03/16/24 1330    diphenhydrAMINE  HCl, Sleep, 25 MG TBDP  At bedtime PRN        03/16/24 1330             Note:  This document was prepared using Dragon voice recognition software and may include unintentional dictation errors.   Suzanne Kirsch, MD 03/16/24 1555

## 2024-03-16 NOTE — ED Triage Notes (Addendum)
 Pt comes with bad pressure in belly pain. Pt states this started last week. Pt states she went to UC and found out she is pregnant. Pt was sent here for evaluation of her pain and pregnancy. Pt denies any vaginal bleeding.

## 2024-03-16 NOTE — ED Notes (Signed)
 Patient is being discharged from the Urgent Care and sent to the Emergency Department via POV . Per Burnard Cork NP, patient is in need of higher level of care due to lower abdominal pain/ positive pregnancy test. Patient is aware and verbalizes understanding of plan of care.  Vitals:   03/16/24 0902  BP: 122/70  Pulse: 71  Resp: 18  Temp: 98 F (36.7 C)  SpO2: 100%

## 2024-03-16 NOTE — ED Provider Notes (Signed)
 [facility]    CSN: [id number] Arrival date & time: 03/16/24  0844      History   Chief Complaint Chief Complaint  Patient presents with   Abdominal Pain   Nausea    HPI Kelli Soto is a 28 y.o. female.  Patient presents with 5-day history of lower abdominal pain, nausea, constipation.  She also notes mild intermittent nonproductive cough and runny nose x 2 days.  No fever, shortness of breath, vomiting, dysuria, hematuria, vaginal discharge, vaginal bleeding, pelvic pain.  Last bowel movement yesterday.  She is late on her menstrual cycle.  Patient was seen by her PCP on 03/05/2024 for routine STD screening; negative for gonorrhea, chlamydia, trichomonas, hepatitis C, HIV, syphilis; positive for HSV 1.  She had a telehealth visit with her PCP on 03/10/2024 to discuss the results of her tests.  The history is provided by the patient and medical records.    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 03/10/2024   Family history of diabetes mellitus 11/14/2023   Encounter for screening and preventative care 11/14/2023   Nausea 11/14/2023   Depression, recurrent 10/17/2023   Migraine without aura and without status migrainosus, not intractable 12/24/2019   Low iron 02/10/2019   Low serum vitamin D  12/29/2018   Neutropenia 12/29/2018    Past Surgical History:  Procedure Laterality Date   CESAREAN SECTION MULTI-GESTATIONAL N/A 06/10/2019   Procedure: CESAREAN SECTION MULTI-GESTATIONAL;  Surgeon: Alger Gong, MD;  Location: [facility] LD ORS;  Service: Obstetrics;  Laterality: N/A;    OB History     Gravida  2   Para  2   Term  1    Preterm  1   AB      Living  3      SAB      IAB      Ectopic      Multiple  1   Live Births  3            Home Medications    Prior to Admission medications   Medication Sig Start Date End Date Taking? Authorizing Provider  methocarbamol (ROBAXIN) 500 MG tablet Take 1 tablet by mouth as needed. Patient not taking: Reported on 03/16/2024    [provider]  metroNIDAZOLE  (METROGEL ) 0.75 % vaginal gel PLACE 1 APPLICATORFUL VAGINALLY AT BEDTIME FOR 5 DAYS. Patient not taking: Reported on 03/16/2024 11/07/23   Anyanwu, Ugonna A, MD  norelgestromin -ethinyl estradiol  (XULANE ) 150-35 MCG/24HR transdermal patch PLACE 1 PATCH ONTO THE SKIN ONCE A WEEK FOR 3 WEEKS LEAVE OFF FOR THE 4TH TO HAVE YOUR PERIOD 04/22/23   Anyanwu, Gloris LABOR, MD  ondansetron  (ZOFRAN -ODT) 4 MG disintegrating tablet Take 1 tablet (4 mg total) by mouth every 8 (eight) hours as needed for nausea or vomiting. 11/14/23   Vincente Shivers, NP  sertraline  (ZOLOFT ) 25 MG tablet Take 1 tablet (25 mg total) by mouth daily. 11/14/23   Vincente Shivers, NP  Vitamin D , Ergocalciferol , (DRISDOL ) 1.25 MG (50000 UNIT) CAPS capsule Take 1 capsule (50,000 Units total) by mouth every 7 (seven)  days. 11/14/23   Vincente Shivers, NP    Family History Family History  Problem Relation Age of Onset   Hypertension Mother    Depression Father    Asthma Father    Hypertension Father    Healthy Father    Diabetes Other     Social History Social History   Tobacco Use   Smoking status: Some Days    Types: Cigars   Smokeless tobacco: Never  Vaping Use   Vaping status: Never Used  Substance Use Topics   Alcohol use: No   Drug use: Not Currently    Types: Marijuana    Comment: last smoked 2 weeks from August 28th     Allergies   Patient has no known allergies.   Review of Systems Review of Systems  Constitutional:  Negative for chills and fever.  HENT:  Positive for rhinorrhea. Negative for ear pain and sore throat.    Respiratory:  Positive for cough. Negative for shortness of breath.   Gastrointestinal:  Positive for abdominal pain, constipation and nausea. Negative for diarrhea and vomiting.  Genitourinary:  Negative for dysuria, hematuria, pelvic pain, vaginal bleeding and vaginal discharge.     Physical Exam Triage Vital Signs ED Triage Vitals  Encounter Vitals Group     BP      Girls Systolic BP Percentile      Girls Diastolic BP Percentile      Boys Systolic BP Percentile      Boys Diastolic BP Percentile      Pulse      Resp      Temp      Temp src      SpO2      Weight      Height      Head Circumference      Peak Flow      Pain Score      Pain Loc      Pain Education      Exclude from Growth Chart    No data found.  Updated Vital Signs BP 122/70   Pulse 71   Temp 98 F (36.7 C)   Resp 18   LMP 02/11/2024   SpO2 100%   Visual Acuity Right Eye Distance:   Left Eye Distance:   Bilateral Distance:    Right Eye Near:   Left Eye Near:    Bilateral Near:     Physical Exam Constitutional:      General: She is not in acute distress. HENT:     Right Ear: Tympanic membrane normal.     Left Ear: Tympanic membrane normal.     Nose: Nose normal.     Mouth/Throat:     Mouth: Mucous membranes are moist.     Pharynx: Oropharynx is clear.  Cardiovascular:     Rate and Rhythm: Normal rate and regular rhythm.     Heart sounds: Normal heart sounds.  Pulmonary:     Effort: Pulmonary effort is normal. No respiratory distress.     Breath sounds: Normal breath sounds.  Abdominal:     General: Bowel sounds are normal.     Palpations: Abdomen is soft.     Tenderness: There is no abdominal tenderness. There is no right CVA tenderness, left CVA tenderness, guarding or rebound.  Neurological:     Mental Status: She is alert.      UC Treatments / Results  Labs (all labs ordered are listed, but only abnormal results are displayed) Labs  Reviewed  POCT URINE DIPSTICK -  Abnormal; Notable for the following components:      Result Value   Bilirubin, UA small (*)    Ketones, POC UA moderate (40) (*)    All other components within normal limits  POCT URINE PREGNANCY - Abnormal; Notable for the following components:   Preg Test, Ur Positive (*)    All other components within normal limits    EKG   Radiology No results found.  Procedures Procedures (including critical care time)  Medications Ordered in UC Medications - No data to display  Initial Impression / Assessment and Plan / UC Course  I have reviewed the triage vital signs and the nursing notes.  Pertinent labs & imaging results that were available during my care of the patient were reviewed by me and considered in my medical decision making (see chart for details).    Positive pregnancy test, less than [redacted] weeks pregnant, lower abdominal pain.  Afebrile and vital signs are stable.  Sending patient to the ED for evaluation of her lower abdominal pain in the setting of pregnancy.  She will drive herself to [facility] ED.  Final Clinical Impressions(s) / UC Diagnoses   Final diagnoses:  Positive pregnancy test  Less than [redacted] weeks gestation of pregnancy  Lower abdominal pain     Discharge Instructions      Go to the emergency department for evaluation of your lower abdominal pain and positive pregnancy test.     ED Prescriptions   None    PDMP not reviewed this encounter.   Corlis Burnard DEL, NP 03/16/24 [phone]

## 2024-03-16 NOTE — ED Notes (Signed)
 See triage note  Presents with some abd pain  States this started abut 1 week ago  Afebrile on arrival  States recently found out she was pregnant  Denies any vaginal bleeding

## 2024-03-16 NOTE — ED Triage Notes (Signed)
 Patient to Urgent Care with complaints of cough/ generalized abdominal pain/ nausea/ constipation/ denies any known fevers.  Symptoms for approx 1 week. States she is also 5 days late for her cycle.

## 2024-03-16 NOTE — Discharge Instructions (Addendum)
Go to the emergency department for evaluation of your lower abdominal pain and positive pregnancy test.  

## 2024-03-16 NOTE — Discharge Instructions (Addendum)
 You were seen in the emergency department for abdominal pain.  You are found to be in her first trimester pregnancy.  You had an ultrasound done that showed early pregnancy of approximately 5 weeks and 3 days but did not show a yolk sac.  You need to follow-up with the obstetrician group to have repeat ultrasound done.  Return to the emergency department if you have any worsening pain or significant vaginal bleeding.  You are given a prescription for B6 and Unisom  which can help with nausea.

## 2024-03-18 ENCOUNTER — Encounter (HOSPITAL_COMMUNITY): Payer: Self-pay | Admitting: *Deleted

## 2024-03-18 ENCOUNTER — Inpatient Hospital Stay (HOSPITAL_COMMUNITY)
Admission: AD | Admit: 2024-03-18 | Discharge: 2024-03-18 | Disposition: A | Discharge status: Home / Self Care | Attending: Obstetrics and Gynecology | Admitting: Obstetrics and Gynecology

## 2024-03-18 ENCOUNTER — Inpatient Hospital Stay (HOSPITAL_COMMUNITY)

## 2024-03-18 ENCOUNTER — Other Ambulatory Visit: Payer: Self-pay

## 2024-03-18 DIAGNOSIS — O26891 Other specified pregnancy related conditions, first trimester: Secondary | ICD-10-CM | POA: Diagnosis present

## 2024-03-18 DIAGNOSIS — Z3A01 Less than 8 weeks gestation of pregnancy: Secondary | ICD-10-CM | POA: Diagnosis not present

## 2024-03-18 DIAGNOSIS — R109 Unspecified abdominal pain: Secondary | ICD-10-CM

## 2024-03-18 DIAGNOSIS — N8312 Corpus luteum cyst of left ovary: Secondary | ICD-10-CM | POA: Insufficient documentation

## 2024-03-18 DIAGNOSIS — O3481 Maternal care for other abnormalities of pelvic organs, first trimester: Secondary | ICD-10-CM | POA: Diagnosis not present

## 2024-03-18 DIAGNOSIS — R1032 Left lower quadrant pain: Secondary | ICD-10-CM | POA: Diagnosis not present

## 2024-03-18 DIAGNOSIS — Z3491 Encounter for supervision of normal pregnancy, unspecified, first trimester: Secondary | ICD-10-CM

## 2024-03-18 LAB — URINALYSIS, ROUTINE W REFLEX MICROSCOPIC
Glucose, UA: NEGATIVE mg/dL
Hgb urine dipstick: NEGATIVE
Ketones, ur: 20 mg/dL — AB
Leukocytes,Ua: NEGATIVE
Nitrite: NEGATIVE
Protein, ur: 30 mg/dL — AB
Specific Gravity, Urine: 1.034 — ABNORMAL HIGH (ref 1.005–1.030)
pH: 5 (ref 5.0–8.0)

## 2024-03-18 LAB — HCG, QUANTITATIVE, PREGNANCY: hCG, Beta Chain, Quant, S: 6457 m[IU]/mL — ABNORMAL HIGH (ref <5)

## 2024-03-18 NOTE — MAU Provider Note (Signed)
 Chief Complaint: Abdominal Pain   None     SUBJECTIVE HPI: Kelli Soto is a 28 y.o. [id number] at [redacted]w[redacted]d by early ultrasound who presents to maternity admissions reporting left lower quadrant pain that radiates to the low mid abdomen and right lower abdomen intermittently. Pain started 5 days ago, but worsened last night. Pt had US  on 03/16/24 at [facility] and has appt on 10/14 with OB/Gyn.   HPI  Past Medical History:  Diagnosis Date  . Anemia   . Anxiety   . Bronchitis   . Chlamydia 02/19/2017  . Generalized body aches 05/28/2022  . Gonorrhea 2019  . History of chicken pox   . Neutropenia   . Panic attack    Past Surgical History:  Procedure Laterality Date  . CESAREAN SECTION MULTI-GESTATIONAL N/A 06/10/2019   Procedure: CESAREAN SECTION MULTI-GESTATIONAL;  Surgeon: Alger Gong, MD;  Location: [facility] LD ORS;  Service: Obstetrics;  Laterality: N/A;   Social History   Socioeconomic History  . Marital status: Single    Spouse name: Not on file  . Number of children: Not on file  . Years of education: Not on file  . Highest education level: Not on file  Occupational History  . Not on file  Tobacco Use  . Smoking status: Some Days    Types: Cigars  . Smokeless tobacco: Never  Vaping Use  . Vaping status: Never Used  Substance and Sexual Activity  . Alcohol use: No  . Drug use: Not Currently    Types: Marijuana    Comment: last smoked 2 weeks from August 28th  . Sexual activity: Yes    Birth control/protection: Patch  Other Topics Concern  . Not on file  Social History Narrative   Lives in [place]; daughter 3 years; no smoking; no alcohol; used to be med Best boy.    Social Drivers of Corporate investment banker Strain: Not on file  Food Insecurity: Not on file  Transportation Needs: Not on file  Physical Activity: Not on file  Stress: Not on file  Social Connections: Not on file  Intimate Partner Violence: Not on file   No current  facility-administered medications on file prior to encounter.   Current Outpatient Medications on File Prior to Encounter  Medication Sig Dispense Refill  . diphenhydrAMINE  HCl, Sleep, 25 MG TBDP Take 1 tablet (25 mg total) by mouth at bedtime as needed (nausea). 30 tablet 2  . ondansetron  (ZOFRAN -ODT) 4 MG disintegrating tablet Take 1 tablet (4 mg total) by mouth every 8 (eight) hours as needed for nausea or vomiting. 20 tablet 0  . pyridOXINE  (VITAMIN B6) 25 MG tablet Take 1 tablet (25 mg total) by mouth every 6 (six) hours as needed (Nausea vomiting). 60 tablet 0  . sertraline  (ZOLOFT ) 25 MG tablet Take 1 tablet (25 mg total) by mouth daily. 90 tablet 1  . Vitamin D , Ergocalciferol , (DRISDOL ) 1.25 MG (50000 UNIT) CAPS capsule Take 1 capsule (50,000 Units total) by mouth every 7 (seven) days. 12 capsule 0   No Known Allergies  ROS:  Review of Systems  Constitutional:  Negative for chills, fatigue and fever.  Respiratory:  Negative for shortness of breath.   Cardiovascular:  Negative for chest pain.  Gastrointestinal:  Positive for abdominal pain.  Genitourinary:  Positive for pelvic pain. Negative for difficulty urinating, dysuria, flank pain, vaginal bleeding, vaginal discharge and vaginal pain.  Neurological:  Negative for dizziness and headaches.  Psychiatric/Behavioral: Negative.  I have reviewed patient's Past Medical Hx, Surgical Hx, Family Hx, Social Hx, medications and allergies.   Physical Exam  Patient Vitals for the past 24 hrs:  BP Temp Temp src Pulse Resp SpO2 Height Weight  03/18/24 0856 (!) 110/53 98.4 F (36.9 C) Oral 65 20 100 % -- --  03/18/24 0849 -- -- -- -- -- -- 5' 4 (1.626 m) 66 kg   Constitutional: Well-developed, well-nourished female in no acute distress.  Cardiovascular: normal rate Respiratory: normal effort GI: Abd soft, non-tender. Pos BS x 4 MS: Extremities nontender, no edema, normal ROM Neurologic: Alert and oriented x 4.  GU: Neg  CVAT.  PELVIC EXAM: deferred   LAB RESULTS Results for orders placed or performed during the hospital encounter of 03/18/24 (from the past 24 hours)  Urinalysis, Routine w reflex microscopic -Urine, Clean Catch     Status: Abnormal   Collection Time: 03/18/24  9:06 AM  Result Value Ref Range   Color, Urine AMBER (A) YELLOW   APPearance HAZY (A) CLEAR   Specific Gravity, Urine 1.034 (H) 1.005 - 1.030   pH 5.0 5.0 - 8.0   Glucose, UA NEGATIVE NEGATIVE mg/dL   Hgb urine dipstick NEGATIVE NEGATIVE   Bilirubin Urine SMALL (A) NEGATIVE   Ketones, ur 20 (A) NEGATIVE mg/dL   Protein, ur 30 (A) NEGATIVE mg/dL   Nitrite NEGATIVE NEGATIVE   Leukocytes,Ua NEGATIVE NEGATIVE   RBC / HPF 0-5 0 - 5 RBC/hpf   WBC, UA 0-5 0 - 5 WBC/hpf   Bacteria, UA RARE (A) NONE SEEN   Squamous Epithelial / HPF 0-5 0 - 5 /HPF   Mucus PRESENT   hCG, quantitative, pregnancy     Status: Abnormal   Collection Time: 03/18/24  9:23 AM  Result Value Ref Range   hCG, Beta Chain, Quant, S 6,457 (H) <5 mIU/mL       IMAGING US  OB Transvaginal Result Date: 03/18/2024 CLINICAL DATA:  Right lower quadrant pain. First trimester pregnancy with inconclusive fetal viability. EXAM: TRANSVAGINAL OB ULTRASOUND TECHNIQUE: Transvaginal ultrasound was performed for complete evaluation of the gestation as well as the maternal uterus, adnexal regions, and pelvic cul-de-sac. COMPARISON:  03/16/2024 FINDINGS: Intrauterine gestational sac: Single Yolk sac:  Visualized. Embryo:  Not Visualized. MSD: 7 mm   5 w   3 d Subchorionic hemorrhage:  None visualized. Maternal uterus/adnexae: Both ovaries are normal appearance. No mass or abnormal free fluid identified. IMPRESSION: Single intrauterine gestational sac measuring 5 weeks 3 days, and showing appropriate progression since recent study. Consider correlation with serial b-hCG levels, and followup ultrasound to assess viability in 10 days. No maternal uterine or adnexal abnormality identified.  Electronically Signed   By: Norleen DELENA Kil M.D.   On: 03/18/2024 09:56   US  OB LESS THAN 14 WEEKS WITH OB TRANSVAGINAL Result Date: 03/16/2024 CLINICAL DATA:  [id number] Abdominal pain affecting pregnancy [id number]. EXAM: OBSTETRIC <14 WK US  AND TRANSVAGINAL OB US  TECHNIQUE: Both transabdominal and transvaginal ultrasound examinations were performed for complete evaluation of the gestation as well as the maternal uterus, adnexal regions, and pelvic cul-de-sac. Transvaginal technique was performed to assess early pregnancy. COMPARISON:  None Available. FINDINGS: Intrauterine gestational sac: Single Yolk sac:  Not Visualized. Embryo:  Not Visualized. MSD: 5.2 mm   5 w   2 d Subchorionic hemorrhage:  None visualized. Maternal uterus/adnexae: Bilateral ovaries are visualized and appears within normal limits. Corpus luteal cyst noted in the left ovary. IMPRESSION: *Small single intrauterine gestational sac noted with mean  sac diameter corresponding to 5 weeks 2 days. However, no embryo seen. Findings are most likely due to early gestational age. Short-term follow-up examination is recommended in 10-14 days to document satisfactory progression of pregnancy. Electronically Signed   By: Ree Molt M.D.   On: 03/16/2024 13:18    MAU Management/MDM: Orders Placed This Encounter  Procedures  . Culture, OB Urine  . US  OB Transvaginal  . Urinalysis, Routine w reflex microscopic -Urine, Clean Catch  . hCG, quantitative, pregnancy  . Discharge patient Discharge disposition: 01-Home or Self Care; Discharge patient date: 03/18/2024    No orders of the defined types were placed in this encounter.   No acute abdomen.  US  confirms IUP, left corpus luteal cyst noted, may be source of pain. Pt plans termination, discussed options.  Pt to f/u as planned.   ASSESSMENT 1. Abdominal pain during pregnancy, first trimester   2. Normal IUP (intrauterine pregnancy) on prenatal ultrasound, first trimester   3. [redacted] weeks gestation  of pregnancy     PLAN Discharge home Allergies as of 03/18/2024   No Known Allergies      Medication List     STOP taking these medications    norelgestromin -ethinyl estradiol  150-35 MCG/24HR transdermal patch Commonly known as: Xulane        TAKE these medications    diphenhydrAMINE  HCl (Sleep) 25 MG Tbdp Take 1 tablet (25 mg total) by mouth at bedtime as needed (nausea).   ondansetron  4 MG disintegrating tablet Commonly known as: ZOFRAN -ODT Take 1 tablet (4 mg total) by mouth every 8 (eight) hours as needed for nausea or vomiting.   pyridOXINE  25 MG tablet Commonly known as: VITAMIN B6 Take 1 tablet (25 mg total) by mouth every 6 (six) hours as needed (Nausea vomiting).   sertraline  25 MG tablet Commonly known as: ZOLOFT  Take 1 tablet (25 mg total) by mouth daily.   Vitamin D  (Ergocalciferol ) 1.25 MG (50000 UNIT) Caps capsule Commonly known as: DRISDOL  Take 1 capsule (50,000 Units total) by mouth every 7 (seven) days.        Follow-up Information     OB provider of your choice Follow up.                  Olam Boards Certified Nurse-Midwife 03/18/2024  11:07 AM

## 2024-03-18 NOTE — MAU Note (Signed)
 Kelli Soto is a 28 y.o. at [redacted]w[redacted]d here in MAU reporting: she's having right lower abdominal pain that began five days ago, but worsened last night.  Also states she has pain in the right shoulder feels like I pulled a muscle.  Took Tylenol  500 mg x2 at 0200.  Denies VB.  LMP: 02/11/2024 Onset of complaint: 5 days ago Pain score: 8 Vitals:   03/18/24 0856  BP: (!) 110/53  Pulse: 65  Resp: 20  Temp: 98.4 F (36.9 C)  SpO2: 100%     FHT: NA  Lab orders placed from triage: UA

## 2024-03-19 LAB — CULTURE, OB URINE: Culture: <10000 — AB

## 2024-03-20 ENCOUNTER — Other Ambulatory Visit: Payer: Self-pay | Admitting: Advanced Practice Midwife

## 2024-03-20 MED ORDER — ONDANSETRON HCL 4 MG PO TABS: 4.0000 mg | ORAL_TABLET | Freq: Three times a day (TID) | ORAL | 0 refills | Status: DC | PRN

## 2024-03-23 ENCOUNTER — Other Ambulatory Visit: Payer: Self-pay

## 2024-03-23 DIAGNOSIS — O3481 Maternal care for other abnormalities of pelvic organs, first trimester: Secondary | ICD-10-CM | POA: Diagnosis not present

## 2024-03-23 DIAGNOSIS — O26891 Other specified pregnancy related conditions, first trimester: Secondary | ICD-10-CM | POA: Diagnosis present

## 2024-03-23 DIAGNOSIS — N831 Corpus luteum cyst of ovary, unspecified side: Secondary | ICD-10-CM | POA: Diagnosis not present

## 2024-03-23 DIAGNOSIS — Z3A01 Less than 8 weeks gestation of pregnancy: Secondary | ICD-10-CM | POA: Insufficient documentation

## 2024-03-23 NOTE — ED Triage Notes (Signed)
 Patient brought in by self for lower abd pain since 9/29, seen previously for same. Seen at [facility] 10/01 and diagnosed with ovarian cyst. Patient last took tylenol  yesterday, no pain meds today. AxOx4, ambulatory to triage, current pain 8/10. Pt refused blood work in triage.

## 2024-03-24 ENCOUNTER — Emergency Department

## 2024-03-24 ENCOUNTER — Emergency Department
Admission: EM | Admit: 2024-03-24 | Discharge: 2024-03-24 | Disposition: A | Discharge status: Home / Self Care | Attending: Emergency Medicine | Admitting: Emergency Medicine

## 2024-03-24 DIAGNOSIS — R109 Unspecified abdominal pain: Secondary | ICD-10-CM

## 2024-03-24 DIAGNOSIS — N831 Corpus luteum cyst of ovary, unspecified side: Secondary | ICD-10-CM

## 2024-03-24 LAB — URINALYSIS, ROUTINE W REFLEX MICROSCOPIC
Bilirubin Urine: NEGATIVE
Glucose, UA: NEGATIVE mg/dL
Hgb urine dipstick: NEGATIVE
Ketones, ur: 20 mg/dL — AB
Leukocytes,Ua: NEGATIVE
Nitrite: NEGATIVE
Protein, ur: 30 mg/dL — AB
Specific Gravity, Urine: 1.029 (ref 1.005–1.030)
pH: 5 (ref 5.0–8.0)

## 2024-03-24 LAB — COMPREHENSIVE METABOLIC PANEL WITH GFR
ALT: 27 U/L (ref 0–44)
AST: 30 U/L (ref 15–41)
Albumin: 4.3 g/dL (ref 3.5–5.0)
Alkaline Phosphatase: 60 U/L (ref 38–126)
Anion gap: 12 (ref 5–15)
BUN: 9 mg/dL (ref 6–20)
CO2: 23 mmol/L (ref 22–32)
Calcium: 9 mg/dL (ref 8.9–10.3)
Chloride: 99 mmol/L (ref 98–111)
Creatinine, Ser: 0.64 mg/dL (ref 0.44–1.00)
GFR, Estimated: >60 mL/min (ref >60)
Glucose, Bld: 90 mg/dL (ref 70–99)
Potassium: 3.2 mmol/L — ABNORMAL LOW (ref 3.5–5.1)
Sodium: 134 mmol/L — ABNORMAL LOW (ref 135–145)
Total Bilirubin: 0.9 mg/dL (ref 0.0–1.2)
Total Protein: 7.8 g/dL (ref 6.5–8.1)

## 2024-03-24 LAB — HCG, QUANTITATIVE, PREGNANCY: hCG, Beta Chain, Quant, S: 27222 m[IU]/mL — ABNORMAL HIGH (ref <5)

## 2024-03-24 LAB — LIPASE, BLOOD: Lipase: 18 U/L (ref 11–51)

## 2024-03-24 LAB — CBC
HCT: 33.3 % — ABNORMAL LOW (ref 36.0–46.0)
Hemoglobin: 11.3 g/dL — ABNORMAL LOW (ref 12.0–15.0)
MCH: 30.2 pg (ref 26.0–34.0)
MCHC: 33.9 g/dL (ref 30.0–36.0)
MCV: 89 fL (ref 80.0–100.0)
Platelets: 210 K/uL (ref 150–400)
RBC: 3.74 MIL/uL — ABNORMAL LOW (ref 3.87–5.11)
RDW: 12.8 % (ref 11.5–15.5)
WBC: 7 K/uL (ref 4.0–10.5)
nRBC: 0 % (ref 0.0–0.2)

## 2024-03-24 LAB — PREGNANCY, URINE: Preg Test, Ur: POSITIVE — AB

## 2024-03-24 MED ORDER — OXYCODONE-ACETAMINOPHEN 5-325 MG PO TABS
1.0000 | ORAL_TABLET | Freq: Once | ORAL | Status: AC
  Administered 2024-03-24: 1 via ORAL
  Filled 2024-03-24: qty 1

## 2024-03-24 MED ORDER — ONDANSETRON 4 MG PO TBDP: 4.0000 mg | ORAL_TABLET | Freq: Three times a day (TID) | ORAL | 0 refills | Status: DC | PRN

## 2024-03-24 MED ORDER — ONDANSETRON 4 MG PO TBDP
4.0000 mg | ORAL_TABLET | Freq: Once | ORAL | Status: AC
  Administered 2024-03-24: 4 mg via ORAL
  Filled 2024-03-24: qty 1

## 2024-03-24 NOTE — ED Provider Notes (Signed)
 [facility] Provider Note    Event Date/Time   First MD Initiated Contact with Patient 03/24/24 [phone]     (approximate)   History   Chief Complaint Abdominal Pain   HPI  Kelli Soto is a 28 y.o. female with past medical history of anemia, anxiety, and migraines, [id number] at approximately 5 weeks of pregnancy who presents to the ED complaining of abdominal pain.  Patient reports that she has been dealing with increasing crampy pain in the bilateral lower quadrants of her abdomen for the past 5 days.  She was seen 6 days ago for this, had an ultrasound that showed intrauterine gestational sac without a yolk sac, also noted left corpus luteal cyst.  She states that she has been taking Tylenol  since then without relief of her symptoms, does endorse some dysuria but denies any fevers or flank pain.  She has been occasionally feeling nauseous but has not vomited and denies any changes in her bowel movements.     Physical Exam   Triage Vital Signs: ED Triage Vitals [03/23/24 2157]  Encounter Vitals Group     BP 119/61     Girls Systolic BP Percentile      Girls Diastolic BP Percentile      Boys Systolic BP Percentile      Boys Diastolic BP Percentile      Pulse Rate 72     Resp 18     Temp 98.6 F (37 C)     Temp Source Oral     SpO2 98 %     Weight 150 lb (68 kg)     Height 5' 4 (1.626 m)     Head Circumference      Peak Flow      Pain Score 8     Pain Loc      Pain Education      Exclude from Growth Chart     Most recent vital signs: Vitals:   03/24/24 0300 03/24/24 0323  BP: 113/60 113/60  Pulse: (!) 59 62  Resp:  16  Temp:  98.6 F (37 C)  SpO2: 100% 100%    Constitutional: Alert and oriented. Eyes: Conjunctivae are normal. Head: Atraumatic. Nose: No congestion/rhinnorhea. Mouth/Throat: Mucous membranes are moist.  Cardiovascular: Normal rate, regular rhythm. Grossly normal heart sounds.  2+ radial pulses  bilaterally. Respiratory: Normal respiratory effort.  No retractions. Lungs CTAB. Gastrointestinal: Soft and tender to palpation in the bilateral lower quadrants with no rebound or guarding.  No CVA tenderness bilaterally. No distention. Musculoskeletal: No lower extremity tenderness nor edema.  Neurologic:  Normal speech and language. No gross focal neurologic deficits are appreciated.    ED Results / Procedures / Treatments   Labs (all labs ordered are listed, but only abnormal results are displayed) Labs Reviewed  COMPREHENSIVE METABOLIC PANEL WITH GFR - Abnormal; Notable for the following components:      Result Value   Sodium 134 (*)    Potassium 3.2 (*)    All other components within normal limits  CBC - Abnormal; Notable for the following components:   RBC 3.74 (*)    Hemoglobin 11.3 (*)    HCT 33.3 (*)    All other components within normal limits  URINALYSIS, ROUTINE W REFLEX MICROSCOPIC - Abnormal; Notable for the following components:   Color, Urine YELLOW (*)    APPearance HAZY (*)    Ketones, ur 20 (*)    Protein, ur 30 (*)  Bacteria, UA RARE (*)    All other components within normal limits  HCG, QUANTITATIVE, PREGNANCY - Abnormal; Notable for the following components:   hCG, Beta Chain, Quant, S 27,222 (*)    All other components within normal limits  PREGNANCY, URINE - Abnormal; Notable for the following components:   Preg Test, Ur POSITIVE (*)    All other components within normal limits  LIPASE, BLOOD    RADIOLOGY Pelvic ultrasound reviewed and interpreted by me with intrauterine pregnancy, no evidence of torsion.  PROCEDURES:  Critical Care performed: No  Procedures   MEDICATIONS ORDERED IN ED: Medications  oxyCODONE -acetaminophen  (PERCOCET/ROXICET) 5-325 MG per tablet 1 tablet (1 tablet Oral Given 03/24/24 0232)  ondansetron  (ZOFRAN -ODT) disintegrating tablet 4 mg (4 mg Oral Given 03/24/24 0227)     IMPRESSION / MDM / ASSESSMENT AND PLAN / ED  COURSE  I reviewed the triage vital signs and the nursing notes.                              28 y.o. female [phone] at approximately 5 weeks of pregnancy who presents to the ED complaining of increasing crampy pain in the bilateral lower quadrants of her abdomen over the past 5 days.  Patient's presentation is most consistent with acute presentation with potential threat to life or bodily function.  Differential diagnosis includes, but is not limited to, ectopic pregnancy, ovarian cyst, ovarian torsion, appendicitis, diverticulitis, kidney stone, UTI.  Patient nontoxic-appearing and in no acute distress, vital signs are unremarkable.  Her abdomen is soft but she has tenderness to palpation in the bilateral lower quadrants, will repeat OB ultrasound but also Doppler both ovaries to ensure no evidence of torsion.  She had initially refused labs in triage, is now agreeable to have labs drawn, urinalysis also pending.  We will treat symptomatically with Percocet and Zofran , reassess following labs and imaging.  Pelvic ultrasound shows intrauterine pregnancy with fetal bradycardia, which could be due to early gestation.  No evidence of torsion but patient does have left sided corpus luteum cyst, which could be contributing to her pain.  Labs without significant anemia, leukocytosis, electrolyte abnormality, or AKI.  Beta-hCG is appropriately elevated, urinalysis with no signs of infection.  Patient feeling much better on reassessment and is appropriate for discharge home with OB/GYN follow-up.  She was counseled to return to the ED for new or worsening symptoms, patient agrees with plan.      FINAL CLINICAL IMPRESSION(S) / ED DIAGNOSES   Final diagnoses:  Abdominal pain during pregnancy in first trimester  Corpus luteum cyst     Rx / DC Orders   ED Discharge Orders     None        Note:  This document was prepared using Dragon voice recognition software and may include unintentional  dictation errors.   Willo Dunnings, MD 03/24/24 [phone]

## 2024-03-31 ENCOUNTER — Other Ambulatory Visit

## 2024-03-31 ENCOUNTER — Other Ambulatory Visit: Payer: Self-pay

## 2024-03-31 ENCOUNTER — Ambulatory Visit (INDEPENDENT_AMBULATORY_CARE_PROVIDER_SITE_OTHER): Admitting: Family Medicine

## 2024-03-31 VITALS — BP 110/66 | HR 111 | Wt 149.0 lb

## 2024-03-31 DIAGNOSIS — Z3A01 Less than 8 weeks gestation of pregnancy: Secondary | ICD-10-CM

## 2024-03-31 DIAGNOSIS — Z3480 Encounter for supervision of other normal pregnancy, unspecified trimester: Secondary | ICD-10-CM

## 2024-03-31 DIAGNOSIS — O039 Complete or unspecified spontaneous abortion without complication: Secondary | ICD-10-CM | POA: Diagnosis not present

## 2024-03-31 DIAGNOSIS — O021 Missed abortion: Secondary | ICD-10-CM | POA: Diagnosis not present

## 2024-03-31 NOTE — Assessment & Plan Note (Signed)
 Discussed common causes at early gestational age.  Discussed impossible to prevent, predict and likelihood of repeat. Normal grieving process. Pt. Desires to resume her birth control. On patch and has these at home.

## 2024-03-31 NOTE — Progress Notes (Signed)
   Subjective:    Patient ID: Kelli Soto is a 28 y.o. female presenting with Pregnancy Ultrasound  on 03/31/2024  HPI: Pt. With confirmed IUP and then had heavy bleeding and passage of clots over the weekend. Bleeding has lightened.  Review of Systems  Constitutional:  Negative for chills and fever.  Respiratory:  Negative for shortness of breath.   Cardiovascular:  Negative for chest pain.  Gastrointestinal:  Negative for abdominal pain, nausea and vomiting.  Genitourinary:  Negative for dysuria.  Skin:  Negative for rash.      Objective:    BP 110/66   Pulse (!) 111   Wt 149 lb (67.6 kg)   LMP 02/11/2024   BMI 25.58 kg/m  Physical Exam Exam conducted with a chaperone present.  Constitutional:      General: She is not in acute distress.    Appearance: She is well-developed.  HENT:     Head: Normocephalic and atraumatic.  Eyes:     General: No scleral icterus. Cardiovascular:     Rate and Rhythm: Normal rate.  Pulmonary:     Effort: Pulmonary effort is normal.  Abdominal:     Palpations: Abdomen is soft.  Musculoskeletal:     Cervical back: Neck supple.  Skin:    General: Skin is warm and dry.  Neurological:     Mental Status: She is alert and oriented to person, place, and time.         Assessment & Plan:   Problem List Items Addressed This Visit       Unprioritized   SAB (spontaneous abortion) - Primary   Discussed common causes at early gestational age.  Discussed impossible to prevent, predict and likelihood of repeat. Normal grieving process. Pt. Desires to resume her birth control. On patch and has these at home.       Relevant Orders   US  OB Transvaginal     Return if symptoms worsen or fail to improve.  Glenys GORMAN Birk, MD 03/31/2024 4:12 PM

## 2024-03-31 NOTE — Progress Notes (Signed)
 Patient informed that the ultrasound is considered a limited obstetric ultrasound and is not intended to be a complete ultrasound exam.  Patient also informed that the ultrasound is not being completed with the intent of assessing for fetal or placental anomalies or any pelvic abnormalities. Explained that the purpose of today's ultrasound is to assess for fetal heart rate.  Patient acknowledges the purpose of the exam and the limitations of the study.        Kelli Buckles, RN   Pt had cramping on Friday and then passed a large clot on Saturday and bleeding has gotten better today.

## 2024-04-06 ENCOUNTER — Telehealth: Payer: Self-pay | Admitting: Obstetrics & Gynecology

## 2024-04-06 NOTE — Telephone Encounter (Signed)
 requested to speak to a nurse regarding her miscarriage follow up.

## 2024-04-07 ENCOUNTER — Other Ambulatory Visit: Payer: Self-pay | Admitting: *Deleted

## 2024-04-07 MED ORDER — METRONIDAZOLE 500 MG PO TABS: 500.0000 mg | ORAL_TABLET | Freq: Two times a day (BID) | ORAL | 0 refills | Status: DC

## 2024-04-07 NOTE — Addendum Note (Signed)
 Addended by: Jhene Westmoreland on: 04/07/2024 03:48 PM   Modules accepted: Orders

## 2024-04-07 NOTE — Progress Notes (Signed)
Did not go through electronically

## 2024-04-15 ENCOUNTER — Telehealth: Payer: Self-pay | Admitting: *Deleted

## 2024-04-15 ENCOUNTER — Ambulatory Visit: Admitting: Obstetrics and Gynecology

## 2024-04-15 ENCOUNTER — Other Ambulatory Visit: Payer: Self-pay | Admitting: *Deleted

## 2024-04-15 MED ORDER — NORELGESTROMIN-ETH ESTRADIOL 150-35 MCG/24HR TD PTWK: 1.0000 | MEDICATED_PATCH | TRANSDERMAL | 12 refills | Status: DC

## 2024-04-15 NOTE — Telephone Encounter (Signed)
 erroneous

## 2024-04-15 NOTE — Telephone Encounter (Signed)
-----   Message from Loa J sent at 04/15/2024  9:37 AM EDT ----- Regarding: Medication Request Pt called in requesting refill on norelgestromin -ethinyl estradiol  (XULANE ) 150-35 MCG/24HR transdermal patch.   Pharmacy : [facility] in [place]

## 2024-06-04 ENCOUNTER — Ambulatory Visit: Admitting: General Practice

## 2024-06-04 VITALS — BP 110/78 | HR 70 | Temp 97.7°F | Ht 64.0 in | Wt 148.0 lb

## 2024-06-04 DIAGNOSIS — Z113 Encounter for screening for infections with a predominantly sexual mode of transmission: Secondary | ICD-10-CM

## 2024-06-04 DIAGNOSIS — L708 Other acne: Secondary | ICD-10-CM

## 2024-06-04 DIAGNOSIS — R110 Nausea: Secondary | ICD-10-CM | POA: Diagnosis not present

## 2024-06-04 DIAGNOSIS — R7989 Other specified abnormal findings of blood chemistry: Secondary | ICD-10-CM

## 2024-06-04 DIAGNOSIS — D649 Anemia, unspecified: Secondary | ICD-10-CM | POA: Diagnosis not present

## 2024-06-04 LAB — CBC
HCT: 39.2 % (ref 36.0–46.0)
Hemoglobin: 13 g/dL (ref 12.0–15.0)
MCHC: 33.1 g/dL (ref 30.0–36.0)
MCV: 91 fl (ref 78.0–100.0)
Platelets: 241 K/uL (ref 150.0–400.0)
RBC: 4.31 Mil/uL (ref 3.87–5.11)
RDW: 13.7 % (ref 11.5–15.5)
WBC: 2.6 K/uL — ABNORMAL LOW (ref 4.0–10.5)

## 2024-06-04 LAB — IBC + FERRITIN
Ferritin: 6.5 ng/mL — ABNORMAL LOW (ref 10.0–291.0)
Iron: 107 ug/dL (ref 42–145)
Saturation Ratios: 16.5 % — ABNORMAL LOW (ref 20.0–50.0)
TIBC: 646.8 ug/dL — ABNORMAL HIGH (ref 250.0–450.0)
Transferrin: 462 mg/dL — ABNORMAL HIGH (ref 212.0–360.0)

## 2024-06-04 LAB — VITAMIN D 25 HYDROXY (VIT D DEFICIENCY, FRACTURES): VITD: 14.16 ng/mL — ABNORMAL LOW (ref 30.00–100.00)

## 2024-06-04 MED ORDER — ONDANSETRON 4 MG PO TBDP: 4.0000 mg | ORAL_TABLET | Freq: Three times a day (TID) | ORAL | 0 refills | Status: DC | PRN

## 2024-06-04 NOTE — Patient Instructions (Addendum)
 Stop by the lab prior to leaving today. I will notify you of your results once received.    Continue zoloft .  Continue Vitamin d  daily.   You will either be contacted via phone regarding your referral to dermatology , or you may receive a letter on your MyChart portal from our referral team with instructions for scheduling an appointment. Please let us  know if you have not been contacted by anyone within two weeks.   Follow up in 3 months for STI testing.   It was a pleasure to see you today!

## 2024-06-04 NOTE — Progress Notes (Signed)
 Established Patient Office Visit  Subjective   Patient ID: Kelli Soto, female    DOB: 1995-12-30  Age: 28 y.o. MRN: [id number]  Chief Complaint  Patient presents with   Labs Only    Needs Vitamin D  rechecked and STD labs done. Patient has been off vitamin D  since last visit.     HPI  Discussed the use of AI scribe software for clinical note transcription with the patient, who gave verbal consent to proceed.  History of Present Illness Kelli Soto is a 28 year old female who presents for her routine three-month STD screening and follow-up on vitamin D  deficiency.  She has not been taking her prescribed vitamin D  supplements since her last visit in May, when her levels were recorded at 23.46 ng/mL. She reports feeling fatigued and tired.  She experienced a miscarriage recently, which was not planned. She confirms that she is no longer bleeding and her menstrual cycle has returned to regularity.  She has been feeling nauseous, particularly in the mornings, and attributes some of this to a stomach bug her daughter had recently. She takes medication for nausea as needed.  She is currently on Zoloft  but has not taken it today due to running out of her nausea medication.   She inquires about treatment for acne, which tends to flare up around her menstrual cycle or when her hair is dirty. She describes her skin as oily.  No new sexual partners since her last visit.    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 03/10/2024   Family history of diabetes mellitus 11/14/2023   Encounter for screening and preventative care 11/14/2023   Nausea 11/14/2023   Depression, recurrent 10/17/2023   Migraine without aura and without status migrainosus, not intractable 12/24/2019   Low  iron 02/10/2019   Low serum vitamin D  12/29/2018   Neutropenia 12/29/2018   Past Medical History:  Diagnosis Date   Anemia    Anxiety    Bronchitis    Chlamydia 02/19/2017   Generalized body aches 05/28/2022   Gonorrhea 2019   History of chicken pox    Neutropenia    Panic attack    Past Surgical History:  Procedure Laterality Date   CESAREAN SECTION MULTI-GESTATIONAL N/A 06/10/2019   Procedure: CESAREAN SECTION MULTI-GESTATIONAL;  Surgeon: Alger Gong, MD;  Location: [facility];  Service: Obstetrics;  Laterality: N/A;   Allergies[1]       06/04/2024   10:00 AM 03/05/2024   10:11 AM 12/26/2023   12:18 PM  Depression screen PHQ 2/9  Decreased Interest 1 1 1   Down, Depressed, Hopeless 1 1 1   PHQ - 2 Score 2 2 2   Altered sleeping 0 2 1  Tired, decreased energy 0 1 1  Change in appetite 0 0 1  Feeling bad or failure about yourself  1 1 1   Trouble concentrating 0 0 0  Moving slowly or fidgety/restless 0 0 0  Suicidal thoughts 0 0 0  PHQ-9 Score 3 6  6    Difficult doing work/chores Not difficult at all Somewhat difficult Not difficult at all     Data saved with a previous flowsheet row definition       06/04/2024   10:00 AM 03/05/2024   10:12 AM 12/26/2023  12:18 PM 11/14/2023   10:40 AM  GAD 7 : Generalized Anxiety Score  Nervous, Anxious, on Edge 1 0 1 0  Control/stop worrying 0 0 1 1  Worry too much - different things 1 1 1 1   Trouble relaxing 0 0 1 0  Restless 0 0 0 0  Easily annoyed or irritable 1 1 2  0  Afraid - awful might happen 0 1 1 0  Total GAD 7 Score 3 3 7 2   Anxiety Difficulty Not difficult at all Not difficult at all Not difficult at all Not difficult at all      Review of Systems  Constitutional:  Negative for chills and fever.  Respiratory:  Negative for shortness of breath.   Cardiovascular:  Negative for chest pain.  Gastrointestinal:  Negative for abdominal pain, constipation, diarrhea, heartburn, nausea and vomiting.  Genitourinary:   Negative for dysuria, frequency and urgency.  Neurological:  Negative for dizziness and headaches.  Endo/Heme/Allergies:  Negative for polydipsia.  Psychiatric/Behavioral:  Negative for depression and suicidal ideas. The patient is not nervous/anxious.       Objective:     BP 110/78   Pulse 70   Temp 97.7 F (36.5 C) (Temporal)   Ht 5' 4 (1.626 m)   Wt 148 lb (67.1 kg)   LMP 02/11/2024   SpO2 99%   BMI 25.40 kg/m  BP Readings from Last 3 Encounters:  06/04/24 110/78  03/31/24 110/66  03/24/24 108/62   Wt Readings from Last 3 Encounters:  06/04/24 148 lb (67.1 kg)  03/31/24 149 lb (67.6 kg)  03/23/24 150 lb (68 kg)      Physical Exam Vitals and nursing note reviewed.  Constitutional:      Appearance: Normal appearance.  Cardiovascular:     Rate and Rhythm: Normal rate and regular rhythm.     Pulses: Normal pulses.     Heart sounds: Normal heart sounds.  Pulmonary:     Effort: Pulmonary effort is normal.     Breath sounds: Normal breath sounds.  Neurological:     Mental Status: She is alert and oriented to person, place, and time.  Psychiatric:        Mood and Affect: Mood normal.        Behavior: Behavior normal.        Thought Content: Thought content normal.        Judgment: Judgment normal.     Comments: Flat affect.      No results found for any visits on 06/04/24.     The ASCVD Risk score (Arnett DK, et al., 2019) failed to calculate for the following reasons:   The 2019 ASCVD risk score is only valid for ages 18 to 72   * - Cholesterol units were assumed    Assessment & Plan:  Low serum vitamin D  -     VITAMIN D  25 Hydroxy (Vit-D Deficiency, Fractures)  Nausea -     Ondansetron ; Take 1 tablet (4 mg total) by mouth every 8 (eight) hours as needed for nausea or vomiting.  Dispense: 12 tablet; Refill: 0  Routine screening for STI (sexually transmitted infection) -     HIV Antibody (routine testing w rflx) -     Hepatitis C antibody -      RPR W/RFLX TO RPR TITER, TREPONEMAL AB, SCREEN AND DIAGNOSIS -     C. trachomatis/N. gonorrhoeae RNA -     Herpes Simplex Virus 1 and 2 (IgG), with Reflex to HSV-2  Inhibition  Low hemoglobin -     IBC + Ferritin -     CBC  Other acne -     Ambulatory referral to Dermatology    Assessment and Plan Assessment & Plan Routine screening for sexually transmitted infections Routine STD screening due. Previous positive herpes test noted. - STI panel ordered.  Vitamin D  deficiency Persistent deficiency due to non-compliance with vitamin D  supplementation. Fatigue likely related. - Checked vitamin D  level. - Recommended over-the-counter vitamin D  supplementation in addition to prescription if needed.  Acne Breakouts linked to menstrual cycle and possibly oily skin. Patient interested in treatment. - Recommended over-the-counter benzoyl peroxide. - Referred to dermatology for further evaluation and management.  Nausea Intermittent nausea, possibly related to recent stomach bug exposure. Uses ondansetron  as needed. - Refill sent.  Depression, recurrent Recurrent depression managed with sertraline . No refill needed currently. - Continue sertraline  as prescribed.  Anemia Iron studies not completed. Recent miscarriage with no current bleeding. Regular menstrual cycle. - Ordered iron studies.   Return in about 3 months (around 09/02/2024) for STI screening .    Carrol Aurora, NP     [1] No Known Allergies

## 2024-06-05 ENCOUNTER — Ambulatory Visit: Payer: Self-pay | Admitting: General Practice

## 2024-06-05 DIAGNOSIS — R7989 Other specified abnormal findings of blood chemistry: Secondary | ICD-10-CM

## 2024-06-05 LAB — HERPES SIMPLEX VIRUS 1 AND 2 (IGG),REFLEX HSV-2 INHIBITION
HSV 1 IGG,TYPE SPECIFIC AB: 30.2 {index} — ABNORMAL HIGH
HSV 2 IGG,TYPE SPECIFIC AB: <0.9 {index}

## 2024-06-05 MED ORDER — VITAMIN D (ERGOCALCIFEROL) 1.25 MG (50000 UNIT) PO CAPS: 50000.0000 [IU] | ORAL_CAPSULE | ORAL | 1 refills | Status: AC

## 2024-06-06 LAB — SYPHILIS: RPR W/REFLEX TO RPR TITER AND TREPONEMAL ANTIBODIES, TRADITIONAL SCREENING AND DIAGNOSIS ALGORITHM: RPR Ser Ql: NONREACTIVE

## 2024-06-06 LAB — HEPATITIS C ANTIBODY: Hepatitis C Ab: NONREACTIVE

## 2024-06-06 LAB — C. TRACHOMATIS/N. GONORRHOEAE RNA
C. trachomatis RNA, TMA: NOT DETECTED
N. gonorrhoeae RNA, TMA: NOT DETECTED

## 2024-06-06 LAB — HIV ANTIBODY (ROUTINE TESTING W REFLEX)
HIV 1&2 Ab, 4th Generation: NONREACTIVE
HIV FINAL INTERPRETATION: NEGATIVE

## 2024-06-16 ENCOUNTER — Ambulatory Visit: Admitting: Dermatology

## 2024-06-19 ENCOUNTER — Other Ambulatory Visit: Payer: Self-pay

## 2024-06-19 DIAGNOSIS — R110 Nausea: Secondary | ICD-10-CM

## 2024-06-19 MED ORDER — NORELGESTROMIN-ETH ESTRADIOL 150-35 MCG/24HR TD PTWK: 1.0000 | MEDICATED_PATCH | TRANSDERMAL | 12 refills | Status: AC

## 2024-06-19 MED ORDER — ONDANSETRON 4 MG PO TBDP: 4.0000 mg | ORAL_TABLET | Freq: Three times a day (TID) | ORAL | 0 refills | Status: DC | PRN

## 2024-06-19 NOTE — Telephone Encounter (Signed)
 Pt calling requesting a refill of her BC patch be sent into pharmacy along with Zofran . Sent in for pt

## 2024-06-21 ENCOUNTER — Emergency Department
Admission: EM | Admit: 2024-06-21 | Discharge: 2024-06-22 | Disposition: A | Discharge status: Home / Self Care | Attending: Emergency Medicine | Admitting: Emergency Medicine

## 2024-06-21 ENCOUNTER — Other Ambulatory Visit: Payer: Self-pay

## 2024-06-21 ENCOUNTER — Emergency Department

## 2024-06-21 ENCOUNTER — Encounter: Payer: Self-pay | Admitting: Emergency Medicine

## 2024-06-21 DIAGNOSIS — Y9351 Activity, roller skating (inline) and skateboarding: Secondary | ICD-10-CM | POA: Insufficient documentation

## 2024-06-21 DIAGNOSIS — S93602A Unspecified sprain of left foot, initial encounter: Secondary | ICD-10-CM | POA: Insufficient documentation

## 2024-06-21 DIAGNOSIS — S93402A Sprain of unspecified ligament of left ankle, initial encounter: Secondary | ICD-10-CM | POA: Insufficient documentation

## 2024-06-21 DIAGNOSIS — S81012A Laceration without foreign body, left knee, initial encounter: Secondary | ICD-10-CM | POA: Insufficient documentation

## 2024-06-21 DIAGNOSIS — W19XXXA Unspecified fall, initial encounter: Secondary | ICD-10-CM

## 2024-06-21 DIAGNOSIS — M79605 Pain in left leg: Secondary | ICD-10-CM | POA: Diagnosis present

## 2024-06-21 NOTE — ED Notes (Signed)
 Pt ask for something for pain, advised RN, Heather. PT also ask for something to drink, advised NPO>

## 2024-06-21 NOTE — ED Provider Notes (Signed)
 "  [facility] Provider Note    Event Date/Time   First MD Initiated Contact with Patient 06/21/24 2326     (approximate)   History   Fall   HPI  Brennan A Sze is a 29 y.o. female with history of anxiety, anemia presents to the emergency department after she fell onto her left side while rollerskating.  Complaining of left leg pain.  No head injury, loss of consciousness, use of blood thinners.  No neck or back pain.  No chest or abdominal pain.  Small skin tear to the left knee.  Reports tetanus vaccine updated in 2024.  Unable to ambulate since the fall.  States pain involves the entire left leg but mostly in the foot and ankle.   History provided by patient, friend.    Past Medical History:  Diagnosis Date   Anemia    Anxiety    Bronchitis    Chlamydia 02/19/2017   Generalized body aches 05/28/2022   Gonorrhea 2019   History of chicken pox    Neutropenia    Panic attack     Past Surgical History:  Procedure Laterality Date   CESAREAN SECTION MULTI-GESTATIONAL N/A 06/10/2019   Procedure: CESAREAN SECTION MULTI-GESTATIONAL;  Surgeon: Alger Gong, MD;  Location: [facility] LD ORS;  Service: Obstetrics;  Laterality: N/A;    MEDICATIONS:  Prior to Admission medications  Medication Sig Start Date End Date Taking? Authorizing Provider  norelgestromin -ethinyl estradiol  (XULANE ) 150-35 MCG/24HR transdermal patch Place 1 patch onto the skin once a week. 06/19/24   Fredirick Glenys RAMAN, MD  ondansetron  (ZOFRAN -ODT) 4 MG disintegrating tablet Take 1 tablet (4 mg total) by mouth every 8 (eight) hours as needed for nausea or vomiting. 06/19/24   Fredirick Glenys RAMAN, MD  sertraline  (ZOLOFT ) 25 MG tablet Take 1 tablet (25 mg total) by mouth daily. 11/14/23   Vincente Shivers, NP  Vitamin D , Ergocalciferol , (DRISDOL ) 1.25 MG (50000 UNIT) CAPS capsule Take 1 capsule (50,000 Units total) by mouth every 7 (seven) days. 06/05/24   Vincente Shivers, NP    Physical Exam   Triage  Vital Signs: ED Triage Vitals  Encounter Vitals Group     BP 06/21/24 2316 138/78     Girls Systolic BP Percentile --      Girls Diastolic BP Percentile --      Boys Systolic BP Percentile --      Boys Diastolic BP Percentile --      Pulse Rate 06/21/24 2316 64     Resp 06/21/24 2316 (!) 22     Temp 06/21/24 2316 98.2 F (36.8 C)     Temp Source 06/21/24 2316 Oral     SpO2 06/21/24 2316 99 %     Weight 06/21/24 2317 148 lb (67.1 kg)     Height 06/21/24 2317 5' 4 (1.626 m)     Head Circumference --      Peak Flow --      Pain Score 06/21/24 2317 10     Pain Loc --      Pain Education --      Exclude from Growth Chart --     Most recent vital signs: Vitals:   06/21/24 2316  BP: 138/78  Pulse: 64  Resp: (!) 22  Temp: 98.2 F (36.8 C)  SpO2: 99%     CONSTITUTIONAL: Alert, responds appropriately to questions.  Tearful HEAD: Normocephalic; atraumatic EYES: Conjunctivae clear, PERRL, EOMI ENT: normal nose; no rhinorrhea; moist mucous membranes; pharynx without  lesions noted; no dental injury; no septal hematoma, no epistaxis; no facial deformity or bony tenderness NECK: Supple, no midline spinal tenderness, step-off or deformity; trachea midline CARD: RRR; S1 and S2 appreciated; no murmurs, no clicks, no rubs, no gallops RESP: Normal chest excursion without splinting or tachypnea; breath sounds clear and equal bilaterally; no wheezes, no rhonchi, no rales; no hypoxia or respiratory distress CHEST:  chest wall stable, no crepitus or ecchymosis or deformity, nontender to palpation; no flail chest ABD/GI: Non-distended; soft, non-tender, no rebound, no guarding; no ecchymosis or other lesions noted PELVIS:  stable, nontender to palpation BACK:  The back appears normal; no midline spinal tenderness, step-off or deformity EXT: Patient is tender to palpation over the left foot and ankle without soft tissue swelling, ecchymosis or deformity.  Unable to test for ligamentous laxity  due to patient's level of discomfort.  2+ left DP pulse.  Superficial skin tear to the left knee with mild tenderness in the left knee and left lateral hip but no leg length discrepancy.  Normal capillary refill and sensation. SKIN: Normal color for age and race; warm NEURO: No facial asymmetry, normal speech, moving all extremities equally  ED Results / Procedures / Treatments   LABS: (all labs ordered are listed, but only abnormal results are displayed) Labs Reviewed  POC URINE PREG, ED     EKG:  EKG Interpretation Date/Time:    Ventricular Rate:    PR Interval:    QRS Duration:    QT Interval:    QTC Calculation:   R Axis:      Text Interpretation:            RADIOLOGY: My personal review and interpretation of imaging: X-ray showed no fracture or dislocation.  I have personally reviewed all radiology reports. DG Tibia/Fibula Left Port Result Date: 06/22/2024 EXAM: 2 VIEW(S) XRAY OF THE LEFT TIBIA AND FIBULA 06/21/2024 11:56:00 PM COMPARISON: None available. CLINICAL HISTORY: Fell while rollerskating. Pain. FINDINGS: BONES AND JOINTS: No acute fracture. No malalignment. SOFT TISSUES: The soft tissues are unremarkable. IMPRESSION: 1. No acute fracture or dislocation. Electronically signed by: Elsie Gravely MD 06/22/2024 12:03 AM EST RP Workstation: [id number]   DG FEMUR MIN 2 VIEWS LEFT Result Date: 06/22/2024 EXAM: 2 VIEW(S) XRAY OF THE LEFT FEMUR 06/21/2024 11:56:00 PM COMPARISON: None available. CLINICAL HISTORY: Fell while rollerskating. Pain. FINDINGS: BONES AND JOINTS: No acute fracture. No malalignment. SOFT TISSUES: The soft tissues are unremarkable. IMPRESSION: 1. No acute fracture or dislocation. Electronically signed by: Elsie Gravely MD 06/22/2024 12:02 AM EST RP Workstation: [id number]   DG Foot Complete Left Result Date: 06/22/2024 EXAM: 3 OR MORE VIEW(S) XRAY OF THE LEFT FOOT 06/21/2024 11:56:00 PM COMPARISON: None available. CLINICAL HISTORY: Fell while  rollerskating. Pain. FINDINGS: BONES AND JOINTS: No acute fracture. No malalignment. SOFT TISSUES: The soft tissues are unremarkable. IMPRESSION: 1. No acute fracture or dislocation. Electronically signed by: Elsie Gravely MD 06/22/2024 12:02 AM EST RP Workstation: [id number]   DG Pelvis 1-2 Views Result Date: 06/22/2024 EXAM: 1 or 2 VIEW(S) XRAY OF THE PELVIS 06/21/2024 11:56:00 PM COMPARISON: None available. CLINICAL HISTORY: Clem[place] while roller skating. Pain. FINDINGS: BONES AND JOINTS: No acute fracture. No malalignment. SOFT TISSUES: The soft tissues are unremarkable. IMPRESSION: 1. No evidence of acute traumatic injury. Electronically signed by: Elsie Gravely MD 06/22/2024 12:01 AM EST RP Workstation: [id number]     PROCEDURES:  Critical Care performed: No     Procedures    IMPRESSION / MDM / ASSESSMENT AND  PLAN / ED COURSE  I reviewed the triage vital signs and the nursing notes.  Patient here after mechanical fall with left leg pain.    DIFFERENTIAL DIAGNOSIS (includes but not limited to):   Contusion, sprain, fracture, dislocation  Patient's presentation is most consistent with acute complicated illness / injury requiring diagnostic workup.  PLAN: Will obtain x-rays of the left lower extremity.  Will provide pain medication.  Neurovascular intact distally.   MEDICATIONS GIVEN IN ED: Medications  oxyCODONE  (Oxy IR/ROXICODONE ) immediate release tablet 5 mg (has no administration in time range)  ibuprofen  (ADVIL ) tablet 800 mg (has no administration in time range)  ondansetron  (ZOFRAN -ODT) disintegrating tablet 4 mg (has no administration in time range)     ED COURSE: X-rays reviewed and interpreted by myself and the radiologist and show no fracture or dislocation.  Will place an Ace wrap and give crutches to use as needed.  Given most of her pain is in the foot and ankle, will give podiatry follow-up if symptoms not improving with rest, compression, elevation, ice,  anti-inflammatories.   At this time, I do not feel there is any life-threatening condition present. I reviewed all nursing notes, vitals, pertinent previous records.  All lab and urine results, EKGs, imaging ordered have been independently reviewed and interpreted by myself.  I reviewed all available radiology reports from any imaging ordered this visit.  Based on my assessment, I feel the patient is safe to be discharged home without further emergent workup and can continue workup as an outpatient as needed. Discussed all findings, treatment plan as well as usual and customary return precautions.  They verbalize understanding and are comfortable with this plan.  Outpatient follow-up has been provided as needed.  All questions have been answered.    CONSULTS:  none   OUTSIDE RECORDS REVIEWED: Reviewed recent family medicine notes.       FINAL CLINICAL IMPRESSION(S) / ED DIAGNOSES   Final diagnoses:  Fall, initial encounter  Foot sprain, left, initial encounter  Sprain of left ankle, unspecified ligament, initial encounter     Rx / DC Orders   ED Discharge Orders          Ordered    oxyCODONE  (ROXICODONE ) 5 MG immediate release tablet  Every 8 hours PRN        06/22/24 0015    ibuprofen  (ADVIL ) 800 MG tablet  Every 8 hours PRN        06/22/24 0015    ondansetron  (ZOFRAN -ODT) 4 MG disintegrating tablet  Every 6 hours PRN        06/22/24 0015             Note:  This document was prepared using Dragon voice recognition software and may include unintentional dictation errors.   Avyn Aden, Josette SAILOR, DO 06/22/24 0018  "

## 2024-06-21 NOTE — ED Triage Notes (Signed)
 Pt fell onto her left side while roller skating. Reports pain left hip, left leg, left ankle, and left foot.

## 2024-06-22 MED ORDER — IBUPROFEN 800 MG PO TABS
800.0000 mg | ORAL_TABLET | Freq: Once | ORAL | Status: AC
  Administered 2024-06-22: 800 mg via ORAL
  Filled 2024-06-22: qty 1

## 2024-06-22 MED ORDER — OXYCODONE HCL 5 MG PO TABS
5.0000 mg | ORAL_TABLET | Freq: Once | ORAL | Status: AC
  Administered 2024-06-22: 5 mg via ORAL
  Filled 2024-06-22: qty 1

## 2024-06-22 MED ORDER — MORPHINE SULFATE (PF) 4 MG/ML IV SOLN
4.0000 mg | Freq: Once | INTRAVENOUS | Status: AC
  Administered 2024-06-22: 4 mg via INTRAMUSCULAR
  Filled 2024-06-22: qty 1

## 2024-06-22 MED ORDER — ONDANSETRON 4 MG PO TBDP
4.0000 mg | ORAL_TABLET | Freq: Once | ORAL | Status: AC
  Administered 2024-06-22: 4 mg via ORAL
  Filled 2024-06-22: qty 1

## 2024-06-22 MED ORDER — OXYCODONE HCL 5 MG PO TABS: 5.0000 mg | ORAL_TABLET | Freq: Three times a day (TID) | ORAL | 0 refills | Status: AC | PRN

## 2024-06-22 MED ORDER — IBUPROFEN 800 MG PO TABS: 800.0000 mg | ORAL_TABLET | Freq: Three times a day (TID) | ORAL | 0 refills | Status: AC | PRN

## 2024-06-22 MED ORDER — ONDANSETRON 4 MG PO TBDP: 4.0000 mg | ORAL_TABLET | Freq: Four times a day (QID) | ORAL | 0 refills | Status: AC | PRN

## 2024-06-22 NOTE — ED Notes (Signed)
 Assisted RN, Powell with placing a purewick on the pt as she exhibits behaviors of acute pain in any attempt to move her left leg from her thigh down to her ankle.

## 2024-06-22 NOTE — ED Notes (Signed)
 Notified EDP Ward of pt continued pain.

## 2024-06-22 NOTE — Discharge Instructions (Addendum)

## 2024-07-02 ENCOUNTER — Ambulatory Visit

## 2024-07-02 DIAGNOSIS — S93602A Unspecified sprain of left foot, initial encounter: Secondary | ICD-10-CM

## 2024-07-02 DIAGNOSIS — S93492A Sprain of other ligament of left ankle, initial encounter: Secondary | ICD-10-CM

## 2024-07-02 NOTE — Progress Notes (Signed)
 "  Subjective:  Patient ID: Kelli Soto, female    DOB: 26-Jan-1996,  MRN: [id number]  Chief Complaint  Patient presents with   Foot Pain    She injured her foot on Jan 4 skating    Discussed the use of AI scribe software for clinical note transcription with the patient, who gave verbal consent to proceed.  History of Present Illness Kelli Soto is a 29 year old female who presents with right ankle and foot pain and swelling following an injury sustained while skating.  While skating with her child on January 4th, she fell when her skates became entangled and landed awkwardly on the right lower extremity. She is unsure if she rolled the ankle or foot. She presented to the ED where fracture was ruled out and she was provided a CAM walker.   She had immediate diffuse pain in the right ankle and foot. Pain was initially worst in the ankle, then became more diffuse in the foot over the next days. She can stand but has persistent morning swelling and pain with weight-bearing. Symptoms have improved from the initial injury but mild swelling and abnormal sensation in the foot and ankle persist. She does not usually have foot swelling.  She intermittently uses a walking boot, especially when her toes swell or pressure increases, and previously wrapped the ankle with an Ace bandage. She sometimes walks without the boot but resumes use when symptoms worsen. She has not used medications for pain or swelling.  Her symptoms limit her participation in activities. She denies fever, chills, or other systemic symptoms.     Review of Systems: Negative except as noted in the HPI. Denies N/V/F/Ch.  Past Medical History:  Diagnosis Date   Anemia    Anxiety    Bronchitis    Chlamydia 02/19/2017   Generalized body aches 05/28/2022   Gonorrhea 2019   History of chicken pox    Neutropenia    Panic attack    Current Medications[1]  Tobacco Use History[2]  Allergies[3] Objective:    Constitutional Well developed. Well nourished. Oriented to person, place, and time.  Vascular Dorsalis pedis pulses palpable bilaterally. Posterior tibial pulses palpable bilaterally. Capillary refill normal to all digits.  No cyanosis or clubbing noted. Pedal hair growth normal.  Neurologic Normal speech. Epicritic sensation to light touch grossly intact bilaterally. Negative tinel sign at tarsal tunnel bilaterally.   Dermatologic Skin texture and turgor are within normal limits.  No open wounds. No skin lesions.  Musculoskeletal: 5/5 muscle strength to all major pedal muscle groups with pain on resisted eversion. Moderate pain to palpation peroneal tendons, proximal 5th metatarsal base, and ATFL. No pain to palpation of 5th MTP or with ROM. No other focal areas of pain.    Radiographs: Taken and reviewed.  3 views of the right ankle were taken.  There are no fractures or dislocations identified.  The ankle mortise is well-maintained.  There is no syndesmotic widening.  No acute osseous pathology noted.  Assessment:   1. Sprain of left foot, initial encounter   2. Sprain of anterior talofibular ligament of left ankle, initial encounter      Plan:  Patient was evaluated and treated and all questions answered.  Assessment and Plan Assessment & Plan Left foot and ankle sprain Acute sprain with ligamentous and possible tendon involvement. No fracture on radiographs. Favorable prognosis with conservative management, though recovery may be prolonged. - Continue walking boot until pain-free ambulation. - Transition to lace-up  ankle brace when pain-free in boot; brace dispensed today. - Provided compression sleeve. - Follow-up in four weeks for reassessment. - Consider physical therapy at next visit.  - MRI if symptoms persist.   RTC 4 weeks  Prentice Ovens, DPM AACFAS Fellowship Trained Podiatric Surgeon [facility]     [1]  Current Outpatient  Medications:    ibuprofen  (ADVIL ) 800 MG tablet, Take 1 tablet (800 mg total) by mouth every 8 (eight) hours as needed., Disp: 30 tablet, Rfl: 0   norelgestromin -ethinyl estradiol  (XULANE ) 150-35 MCG/24HR transdermal patch, Place 1 patch onto the skin once a week., Disp: 3 patch, Rfl: 12   ondansetron  (ZOFRAN -ODT) 4 MG disintegrating tablet, Take 1 tablet (4 mg total) by mouth every 6 (six) hours as needed for nausea or vomiting., Disp: 20 tablet, Rfl: 0   oxyCODONE  (ROXICODONE ) 5 MG immediate release tablet, Take 1 tablet (5 mg total) by mouth every 8 (eight) hours as needed., Disp: 12 tablet, Rfl: 0   sertraline  (ZOLOFT ) 25 MG tablet, Take 1 tablet (25 mg total) by mouth daily., Disp: 90 tablet, Rfl: 1   Vitamin D , Ergocalciferol , (DRISDOL ) 1.25 MG (50000 UNIT) CAPS capsule, Take 1 capsule (50,000 Units total) by mouth every 7 (seven) days., Disp: 12 capsule, Rfl: 1 [2]  Social History Tobacco Use  Smoking Status Some Days   Types: Cigars, Cigarettes  Smokeless Tobacco Never  [3] No Known Allergies  "

## 2024-07-06 ENCOUNTER — Ambulatory Visit: Admitting: Podiatry

## 2024-07-22 ENCOUNTER — Telehealth: Payer: Self-pay

## 2024-07-22 NOTE — Telephone Encounter (Signed)
 Patient is requesting note for work, she is able to work with light restrictions

## 2024-07-30 ENCOUNTER — Ambulatory Visit

## 2024-11-17 ENCOUNTER — Encounter: Admitting: General Practice
